# Patient Record
Sex: Male | Born: 1950
Health system: Southern US, Community
[De-identification: ages and names within clinical notes are randomized; demographics above are authoritative.]

## PROBLEM LIST (undated history)

## (undated) DIAGNOSIS — C801 Malignant (primary) neoplasm, unspecified: Secondary | ICD-10-CM

## (undated) DIAGNOSIS — F1910 Other psychoactive substance abuse, uncomplicated: Secondary | ICD-10-CM

## (undated) DIAGNOSIS — F1010 Alcohol abuse, uncomplicated: Secondary | ICD-10-CM

## (undated) DIAGNOSIS — I10 Essential (primary) hypertension: Secondary | ICD-10-CM

## (undated) DIAGNOSIS — E785 Hyperlipidemia, unspecified: Secondary | ICD-10-CM

## (undated) DIAGNOSIS — E119 Type 2 diabetes mellitus without complications: Secondary | ICD-10-CM

## (undated) HISTORY — DX: Other psychoactive substance abuse, uncomplicated: F19.10

## (undated) HISTORY — DX: Type 2 diabetes mellitus without complications: E11.9

## (undated) HISTORY — PX: COLONOSCOPY: SHX174

## (undated) HISTORY — PX: LAPAROSCOPIC GASTROTOMY W/ REPAIR OF ULCER: SUR772

## (undated) HISTORY — DX: Hyperlipidemia, unspecified: E78.5

## (undated) HISTORY — DX: Essential (primary) hypertension: I10

---

## 2008-12-28 ENCOUNTER — Emergency Department (HOSPITAL_COMMUNITY): Admission: EM | Admit: 2008-12-28 | Discharge: 2008-12-28 | Payer: Self-pay | Admitting: Family Medicine

## 2010-05-15 ENCOUNTER — Emergency Department (HOSPITAL_COMMUNITY): Admission: EM | Admit: 2010-05-15 | Discharge: 2010-05-15 | Payer: Self-pay | Admitting: Emergency Medicine

## 2011-04-06 ENCOUNTER — Inpatient Hospital Stay (INDEPENDENT_AMBULATORY_CARE_PROVIDER_SITE_OTHER)
Admission: RE | Admit: 2011-04-06 | Discharge: 2011-04-06 | Disposition: A | Discharge status: Home / Self Care | Payer: BC Managed Care – PPO | Source: Ambulatory Visit | Attending: Emergency Medicine | Admitting: Emergency Medicine

## 2011-04-06 DIAGNOSIS — K089 Disorder of teeth and supporting structures, unspecified: Secondary | ICD-10-CM

## 2011-04-06 DIAGNOSIS — S025XXA Fracture of tooth (traumatic), initial encounter for closed fracture: Secondary | ICD-10-CM

## 2013-12-10 ENCOUNTER — Encounter: Payer: Self-pay | Admitting: Internal Medicine

## 2013-12-10 ENCOUNTER — Ambulatory Visit: Payer: Self-pay | Attending: Internal Medicine | Admitting: Internal Medicine

## 2013-12-10 VITALS — BP 152/99 | HR 82 | Temp 99.3°F | Resp 16 | Ht 66.0 in | Wt 180.0 lb

## 2013-12-10 DIAGNOSIS — F1020 Alcohol dependence, uncomplicated: Secondary | ICD-10-CM

## 2013-12-10 DIAGNOSIS — M79609 Pain in unspecified limb: Secondary | ICD-10-CM | POA: Insufficient documentation

## 2013-12-10 DIAGNOSIS — M79601 Pain in right arm: Secondary | ICD-10-CM

## 2013-12-10 LAB — CBC WITH DIFFERENTIAL/PLATELET
Basophils Absolute: 0 10*3/uL (ref 0.0–0.1)
Eosinophils Relative: 4 % (ref 0–5)
HCT: 43.4 % (ref 39.0–52.0)
Lymphocytes Relative: 41 % (ref 12–46)
Lymphs Abs: 3 10*3/uL (ref 0.7–4.0)
MCH: 31.2 pg (ref 26.0–34.0)
MCV: 91.4 fL (ref 78.0–100.0)
Monocytes Absolute: 0.7 10*3/uL (ref 0.1–1.0)
Monocytes Relative: 9 % (ref 3–12)
Neutro Abs: 3.3 10*3/uL (ref 1.7–7.7)
RBC: 4.75 MIL/uL (ref 4.22–5.81)
RDW: 14.2 % (ref 11.5–15.5)
WBC: 7.2 10*3/uL (ref 4.0–10.5)

## 2013-12-10 LAB — LIPID PANEL
Cholesterol: 207 mg/dL — ABNORMAL HIGH (ref 0–200)
Total CHOL/HDL Ratio: 6.1 Ratio

## 2013-12-10 LAB — COMPLETE METABOLIC PANEL WITH GFR
BUN: 14 mg/dL (ref 6–23)
CO2: 28 mEq/L (ref 19–32)
Calcium: 9.2 mg/dL (ref 8.4–10.5)
Chloride: 103 mEq/L (ref 96–112)
Creat: 0.81 mg/dL (ref 0.50–1.35)
GFR, Est African American: >89 mL/min
GFR, Est Non African American: >89 mL/min
Glucose, Bld: 72 mg/dL (ref 70–99)
Potassium: 4.8 mEq/L (ref 3.5–5.3)

## 2013-12-10 LAB — URIC ACID: Uric Acid, Serum: 5.6 mg/dL (ref 4.0–7.8)

## 2013-12-10 MED ORDER — MELOXICAM 15 MG PO TABS: 15.0000 mg | ORAL_TABLET | Freq: Every day | ORAL | Status: DC

## 2013-12-10 MED ORDER — CYCLOBENZAPRINE HCL 5 MG PO TABS: 5.0000 mg | ORAL_TABLET | Freq: Three times a day (TID) | ORAL | Status: DC | PRN

## 2013-12-10 NOTE — Progress Notes (Signed)
Pt is here to establish care. Pt is requesting a physical. Pt reports having pain in his right arm for about 3 months.

## 2013-12-10 NOTE — Progress Notes (Signed)
Patient ID: Chad Cantu, male   DOB: 1951-04-23, 62 y.o.   MRN: [id number]   CC:  HPI:  62 year old male established care. He complains of right proximal arm muscle pain. No pain in the right shoulder joint. His range of motion is intact. Denies any history of trauma. He admits to drinking on a regular basis up to 2-3 beers a day. He states that if he could afford he would drink a case of beer every day. Denies any chest pain any shortness of breath  He states that he had a colonoscopy 2 years ago at [facility]  Smokes one pack a day Family history Mother had diabetes  No Known Allergies History reviewed. No pertinent past medical history. No current outpatient prescriptions on file prior to visit.   No current facility-administered medications on file prior to visit.   Family History  Problem Relation Age of Onset  . Diabetes Mother    History   Social History  . Marital Status: Married    Spouse Name: N/A    Number of Children: N/A  . Years of Education: N/A   Occupational History  . Not on file.   Social History Main Topics  . Smoking status: Heavy Tobacco Smoker -- 1.00 packs/day  . Smokeless tobacco: Not on file  . Alcohol Use: 1.5 oz/week    3 drink(s) per week  . Drug Use: No  . Sexual Activity: Yes   Other Topics Concern  . Not on file   Social History Narrative  . No narrative on file    Review of Systems  Constitutional: Negative for fever, chills, diaphoresis, activity change, appetite change and fatigue.  HENT: Negative for ear pain, nosebleeds, congestion, facial swelling, rhinorrhea, neck pain, neck stiffness and ear discharge.   Eyes: Negative for pain, discharge, redness, itching and visual disturbance.  Respiratory: Negative for cough, choking, chest tightness, shortness of breath, wheezing and stridor.   Cardiovascular: Negative for chest pain, palpitations and leg swelling.  Gastrointestinal: Negative for abdominal  distention.  Genitourinary: Negative for dysuria, urgency, frequency, hematuria, flank pain, decreased urine volume, difficulty urinating and dyspareunia.  Musculoskeletal: Negative for back pain, joint swelling, arthralgias and gait problem.  Neurological: Negative for dizziness, tremors, seizures, syncope, facial asymmetry, speech difficulty, weakness, light-headedness, numbness and headaches.  Hematological: Negative for adenopathy. Does not bruise/bleed easily.  Psychiatric/Behavioral: Negative for hallucinations, behavioral problems, confusion, dysphoric mood, decreased concentration and agitation.    Objective:   Filed Vitals:   12/10/13 1532  BP: 152/99  Pulse: 82  Temp: 99.3 F (37.4 C)  Resp: 16    Physical Exam  Constitutional: Appears well-developed and well-nourished. No distress.  HENT: Normocephalic. External right and left ear normal. Oropharynx is clear and moist.  Eyes: Conjunctivae and EOM are normal. PERRLA, no scleral icterus.  Neck: Normal ROM. Neck supple. No JVD. No tracheal deviation. No thyromegaly.  CVS: RRR, S1/S2 +, no murmurs, no gallops, no carotid bruit.  Pulmonary: Effort and breath sounds normal, no stridor, rhonchi, wheezes, rales.  Abdominal: Soft. BS +,  no distension, tenderness, rebound or guarding.  Musculoskeletal: Normal range of motion. Proximal arm pain right. No edema and no tenderness.  Lymphadenopathy: No lymphadenopathy noted, cervical, inguinal. Neuro: Alert. Normal reflexes, muscle tone coordination. No cranial nerve deficit. Skin: Skin is warm and dry. No rash noted. Not diaphoretic. No erythema. No pallor.  Psychiatric: Normal mood and affect. Behavior, judgment, thought content normal.   No results found for this basename: WBC, HGB,  HCT, MCV, PLT   No results found for this basename: CREATININE, BUN, NA, K, CL, CO2    No results found for this basename: HGBA1C   Lipid Panel  No results found for this basename: chol, trig,  hdl, cholhdl, vldl, ldlcalc       Assessment and plan:   There are no active problems to display for this patient.      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 The patient verbalized understanding. The patient was told to call to get any lab results if not heard anything in the next week.

## 2013-12-11 ENCOUNTER — Telehealth: Payer: Self-pay | Admitting: *Deleted

## 2013-12-11 LAB — VITAMIN D 25 HYDROXY (VIT D DEFICIENCY, FRACTURES): Vit D, 25-Hydroxy: 36 ng/mL (ref 30–89)

## 2013-12-11 LAB — TSH: TSH: 0.925 u[IU]/mL (ref 0.350–4.500)

## 2013-12-11 MED ORDER — GEMFIBROZIL 600 MG PO TABS: 600.0000 mg | ORAL_TABLET | Freq: Two times a day (BID) | ORAL | Status: DC

## 2013-12-11 NOTE — Telephone Encounter (Signed)
Message copied by Arneisha Kincannon, [place] R on Tue Dec 11, 2013 11:02 AM ------      Message from: Susie Cassette MD, [facility]      Created: Tue Dec 11, 2013 10:42 AM       Please notify patient of the patient's triglycerides of 581, call prescription for Lopid 600 mg twice a day, 30 day supply with 6 refills ------

## 2013-12-11 NOTE — Telephone Encounter (Signed)
Wrong number.

## 2013-12-12 ENCOUNTER — Other Ambulatory Visit: Payer: Self-pay | Admitting: Emergency Medicine

## 2013-12-17 ENCOUNTER — Ambulatory Visit (HOSPITAL_COMMUNITY)
Admission: RE | Admit: 2013-12-17 | Payer: BC Managed Care – PPO | Source: Ambulatory Visit | Attending: Family Medicine | Admitting: Family Medicine

## 2014-03-11 ENCOUNTER — Ambulatory Visit: Payer: BC Managed Care – PPO | Admitting: Internal Medicine

## 2014-04-29 ENCOUNTER — Encounter: Payer: Self-pay | Admitting: Internal Medicine

## 2014-04-29 ENCOUNTER — Ambulatory Visit: Payer: BC Managed Care – PPO | Attending: Internal Medicine | Admitting: Internal Medicine

## 2014-04-29 VITALS — BP 142/79 | HR 81 | Temp 98.6°F | Resp 18 | Ht 66.0 in | Wt 174.6 lb

## 2014-04-29 DIAGNOSIS — Z79899 Other long term (current) drug therapy: Secondary | ICD-10-CM | POA: Insufficient documentation

## 2014-04-29 DIAGNOSIS — F17200 Nicotine dependence, unspecified, uncomplicated: Secondary | ICD-10-CM | POA: Insufficient documentation

## 2014-04-29 DIAGNOSIS — K0889 Other specified disorders of teeth and supporting structures: Secondary | ICD-10-CM

## 2014-04-29 DIAGNOSIS — I10 Essential (primary) hypertension: Secondary | ICD-10-CM | POA: Insufficient documentation

## 2014-04-29 DIAGNOSIS — K089 Disorder of teeth and supporting structures, unspecified: Secondary | ICD-10-CM | POA: Insufficient documentation

## 2014-04-29 DIAGNOSIS — E785 Hyperlipidemia, unspecified: Secondary | ICD-10-CM | POA: Insufficient documentation

## 2014-04-29 DIAGNOSIS — F1010 Alcohol abuse, uncomplicated: Secondary | ICD-10-CM | POA: Insufficient documentation

## 2014-04-29 MED ORDER — GEMFIBROZIL 600 MG PO TABS: 600.0000 mg | ORAL_TABLET | Freq: Two times a day (BID) | ORAL | Status: DC

## 2014-04-29 MED ORDER — TRAMADOL HCL 50 MG PO TABS: 50.0000 mg | ORAL_TABLET | Freq: Four times a day (QID) | ORAL | Status: DC | PRN

## 2014-04-29 MED ORDER — LISINOPRIL-HYDROCHLOROTHIAZIDE 10-12.5 MG PO TABS: 1.0000 | ORAL_TABLET | Freq: Every day | ORAL | Status: DC

## 2014-04-29 MED ORDER — AMOXICILLIN 500 MG PO CAPS: 500.0000 mg | ORAL_CAPSULE | Freq: Three times a day (TID) | ORAL | Status: DC

## 2014-04-29 NOTE — Progress Notes (Signed)
Patient here for med refills States he was seen at urgent care 2 months ago for tooth pain Was placed on meds for HTN and hyperlipidemia at that time.

## 2014-04-29 NOTE — Progress Notes (Signed)
Patient ID: Chad Cantu, male   DOB: 1950-12-14, 63 y.o.   MRN: [id number]  CC: medication refills and followup for hypertension and hyperlipidemia  HPI: Patient presents to clinic today for a routine followup of his hypertension and hyperlipidemia. Patient reports that he has been compliant with medication regimen.  He also states that he he has been having tooth pain and generalized gum pain for over a month. She reports that he does not have the funds to visit the dentist at this time.  No Known Allergies Past Medical History  Diagnosis Date  . Hyperlipidemia   . Hypertension    Current Outpatient Prescriptions on File Prior to Visit  Medication Sig Dispense Refill  . cyclobenzaprine (FLEXERIL) 5 MG tablet Take 1 tablet (5 mg total) by mouth 3 (three) times daily as needed for muscle spasms.  30 tablet  1  . gemfibrozil (LOPID) 600 MG tablet Take 1 tablet (600 mg total) by mouth 2 (two) times daily before a meal.  30 tablet  6  . meloxicam (MOBIC) 15 MG tablet Take 1 tablet (15 mg total) by mouth daily.  30 tablet  0   No current facility-administered medications on file prior to visit.   Family History  Problem Relation Age of Onset  . Diabetes Mother    History   Social History  . Marital Status: Married    Spouse Name: N/A    Number of Children: N/A  . Years of Education: N/A   Occupational History  . Not on file.   Social History Main Topics  . Smoking status: Heavy Tobacco Smoker -- 1.00 packs/day  . Smokeless tobacco: Not on file  . Alcohol Use: 1.5 oz/week    3 drink(s) per week  . Drug Use: No  . Sexual Activity: Yes   Other Topics Concern  . Not on file   Social History Narrative  . No narrative on file   Review of Systems  Constitutional: Negative for fever and chills.  HENT: Negative for ear pain and nosebleeds.        Tooth pain on left side. Gums sore on left-upper and lower   Eyes: Negative for blurred vision.  Cardiovascular: Negative.    Musculoskeletal: Negative.   Neurological: Negative for headaches.      Objective:   Filed Vitals:   04/29/14 1153  BP: 142/79  Pulse: 81  Temp: 98.6 F (37 C)  Resp: 18   Physical Exam  Vitals reviewed. Constitutional: He is oriented to person, place, and time. He appears well-nourished.  Eyes: Pupils are equal, round, and reactive to light. No scleral icterus.  Neck: Normal range of motion. Neck supple. No JVD present.  Cardiovascular: Normal rate, regular rhythm and normal heart sounds.   Pulmonary/Chest: Effort normal and breath sounds normal.  Abdominal: Soft. Bowel sounds are normal.  Musculoskeletal: He exhibits no edema.  Lymphadenopathy:    He has no cervical adenopathy.  Neurological: He is alert and oriented to person, place, and time.  Skin: Skin is warm and dry.  Psychiatric: He has a normal mood and affect. Thought content normal.      Lab Results  Component Value Date   WBC 7.2 12/10/2013   HGB 14.8 12/10/2013   HCT 43.4 12/10/2013   MCV 91.4 12/10/2013   PLT 237 12/10/2013   Lab Results  Component Value Date   CREATININE 0.81 12/10/2013   BUN 14 12/10/2013   NA 138 12/10/2013   K 4.8 12/10/2013  CL 103 12/10/2013   CO2 28 12/10/2013    No results found for this basename: HGBA1C   Lipid Panel     Component Value Date/Time   CHOL 207* 12/10/2013 1602   TRIG 581* 12/10/2013 1602   HDL 34* 12/10/2013 1602   CHOLHDL 6.1 12/10/2013 1602   VLDL NOT CALC 12/10/2013 1602   LDLCALC Comment:   Not calculated due to Triglyceride >400. Suggest ordering Direct LDL (Unit Code: [phone]).   Total Cholesterol/HDL Ratio:CHD Risk                        Coronary Heart Disease Risk Table                                        Men       Women          1/2 Average Risk              3.4        3.3              Average Risk              5.0        4.4           2X Average Risk              9.6        7.1           3X Average Risk             23.4       11.0 Use  the calculated Patient Ratio above and the CHD Risk table  to determine the patient's CHD Risk. ATP III Classification (LDL):       < 100        mg/dL         Optimal      100 - 129     mg/dL         Near or Above Optimal      130 - 159     mg/dL         Borderline High      160 - 189     mg/dL         High       > 190        mg/dL         Very High   12/10/2013 1602       Assessment and plan:   Chad Cantu was seen today for follow-up, med refills, hypertension and hyperlipidemia.  Diagnoses and associated orders for this visit:  HTN (hypertension) Continue current regimen  lisinopril-hydrochlorothiazide (PRINZIDE,ZESTORETIC) 10-12.5 MG per tablet; Take 1 tablet by mouth daily.  Pain, dental - Ambulatory referral to Dentistry - amoxicillin (AMOXIL) 500 MG capsule; Take 1 capsule (500 mg total) by mouth 3 (three) times daily. - traMADol (ULTRAM) 50 MG tablet; Take 1 tablet (50 mg total) by mouth every 6 (six) hours as needed.  HLD (hyperlipidemia) - gemfibrozil (LOPID) 600 MG tablet; Take 1 tablet (600 mg total) by mouth 2 (two) times daily before a meal. Explained to patient that cutting back on alcohol may improve triglycerides. - Lipid panel; Future  Alcohol abuse  counseled patient extensively on the effects of alcohol abuse and cardiovascular health  Return in  about 4 weeks (around 05/27/2014) for Lab Visit, 3 months with PCP.        Chari Manning, NP-C [facility] [phone] 04/30/2014, 9:18 PM

## 2014-05-27 ENCOUNTER — Ambulatory Visit: Payer: BC Managed Care – PPO | Attending: Internal Medicine

## 2014-05-27 DIAGNOSIS — E785 Hyperlipidemia, unspecified: Secondary | ICD-10-CM

## 2014-05-27 LAB — LIPID PANEL
CHOL/HDL RATIO: 4.5 ratio
Cholesterol: 205 mg/dL — ABNORMAL HIGH (ref 0–200)
HDL: 46 mg/dL (ref >39)
LDL CALC: 97 mg/dL (ref 0–99)
Triglycerides: 310 mg/dL — ABNORMAL HIGH (ref <150)
VLDL: 62 mg/dL — ABNORMAL HIGH (ref 0–40)

## 2014-05-29 ENCOUNTER — Telehealth: Payer: Self-pay

## 2014-05-29 NOTE — Telephone Encounter (Signed)
Patient is aware of his lab results 

## 2014-05-29 NOTE — Telephone Encounter (Signed)
Message copied by Dorothe Pea on Wed May 29, 2014 12:44 PM ------      Message from: Chari Manning A      Created: Tue May 28, 2014 10:30 PM       Let patient know his triglycerides have improved but he needs to continue gemfibrozil.  Remind patient about diet changes and alcohol avoidance.  Avoid excessive fried foods, butters, and starches. ------

## 2014-07-31 ENCOUNTER — Ambulatory Visit: Payer: BC Managed Care – PPO | Admitting: Internal Medicine

## 2014-09-20 ENCOUNTER — Ambulatory Visit
Admission: RE | Admit: 2014-09-20 | Discharge: 2014-09-20 | Disposition: A | Discharge status: Home / Self Care | Payer: No Typology Code available for payment source | Source: Ambulatory Visit | Attending: Occupational Medicine | Admitting: Occupational Medicine

## 2014-09-20 ENCOUNTER — Other Ambulatory Visit: Payer: Self-pay | Admitting: Occupational Medicine

## 2014-09-20 DIAGNOSIS — A159 Respiratory tuberculosis unspecified: Secondary | ICD-10-CM

## 2014-09-23 ENCOUNTER — Other Ambulatory Visit: Payer: Self-pay | Admitting: Internal Medicine

## 2014-09-26 ENCOUNTER — Other Ambulatory Visit: Payer: Self-pay | Admitting: Internal Medicine

## 2014-09-26 DIAGNOSIS — I10 Essential (primary) hypertension: Secondary | ICD-10-CM

## 2014-09-26 MED ORDER — LISINOPRIL-HYDROCHLOROTHIAZIDE 10-12.5 MG PO TABS: 1.0000 | ORAL_TABLET | Freq: Every day | ORAL | Status: DC

## 2014-12-17 ENCOUNTER — Encounter: Payer: BC Managed Care – PPO | Admitting: Internal Medicine

## 2015-02-18 ENCOUNTER — Encounter: Payer: Self-pay | Admitting: Internal Medicine

## 2015-04-08 ENCOUNTER — Encounter: Payer: Self-pay | Admitting: Internal Medicine

## 2015-04-08 ENCOUNTER — Ambulatory Visit: Payer: 59 | Attending: Internal Medicine | Admitting: Internal Medicine

## 2015-04-08 VITALS — BP 149/104 | HR 94 | Temp 98.1°F | Resp 16 | Ht 66.0 in | Wt 177.0 lb

## 2015-04-08 DIAGNOSIS — I10 Essential (primary) hypertension: Secondary | ICD-10-CM | POA: Diagnosis not present

## 2015-04-08 DIAGNOSIS — L309 Dermatitis, unspecified: Secondary | ICD-10-CM | POA: Diagnosis not present

## 2015-04-08 DIAGNOSIS — Z720 Tobacco use: Secondary | ICD-10-CM | POA: Insufficient documentation

## 2015-04-08 DIAGNOSIS — F17200 Nicotine dependence, unspecified, uncomplicated: Secondary | ICD-10-CM | POA: Insufficient documentation

## 2015-04-08 LAB — CBC WITH DIFFERENTIAL/PLATELET
BASOS PCT: 1 % (ref 0–1)
Basophils Absolute: 0.1 10*3/uL (ref 0.0–0.1)
Eosinophils Absolute: 0.2 10*3/uL (ref 0.0–0.7)
Eosinophils Relative: 2 % (ref 0–5)
HEMATOCRIT: 44.4 % (ref 39.0–52.0)
Hemoglobin: 15.1 g/dL (ref 13.0–17.0)
LYMPHS PCT: 33 % (ref 12–46)
Lymphs Abs: 2.6 10*3/uL (ref 0.7–4.0)
MCH: 30.9 pg (ref 26.0–34.0)
MCHC: 34 g/dL (ref 30.0–36.0)
MCV: 91 fL (ref 78.0–100.0)
MPV: 9.4 fL (ref 8.6–12.4)
Monocytes Absolute: 0.6 10*3/uL (ref 0.1–1.0)
Monocytes Relative: 8 % (ref 3–12)
NEUTROS ABS: 4.4 10*3/uL (ref 1.7–7.7)
NEUTROS PCT: 56 % (ref 43–77)
Platelets: 259 10*3/uL (ref 150–400)
RBC: 4.88 MIL/uL (ref 4.22–5.81)
RDW: 15.5 % (ref 11.5–15.5)
WBC: 7.9 10*3/uL (ref 4.0–10.5)

## 2015-04-08 LAB — COMPLETE METABOLIC PANEL WITH GFR
ALBUMIN: 4.2 g/dL (ref 3.5–5.2)
ALT: 35 U/L (ref 0–53)
AST: 38 U/L — ABNORMAL HIGH (ref 0–37)
Alkaline Phosphatase: 60 U/L (ref 39–117)
BUN: 12 mg/dL (ref 6–23)
CO2: 22 meq/L (ref 19–32)
Calcium: 9.5 mg/dL (ref 8.4–10.5)
Chloride: 103 mEq/L (ref 96–112)
Creat: 0.81 mg/dL (ref 0.50–1.35)
GFR, Est African American: >89 mL/min
Glucose, Bld: 85 mg/dL (ref 70–99)
POTASSIUM: 4.7 meq/L (ref 3.5–5.3)
Sodium: 139 mEq/L (ref 135–145)
TOTAL PROTEIN: 7.8 g/dL (ref 6.0–8.3)
Total Bilirubin: 0.4 mg/dL (ref 0.2–1.2)

## 2015-04-08 LAB — LIPID PANEL
Cholesterol: 215 mg/dL — ABNORMAL HIGH (ref 0–200)
HDL: 46 mg/dL (ref >=40)
LDL Cholesterol: 103 mg/dL — ABNORMAL HIGH (ref 0–99)
TRIGLYCERIDES: 331 mg/dL — AB (ref <150)
Total CHOL/HDL Ratio: 4.7 Ratio
VLDL: 66 mg/dL — ABNORMAL HIGH (ref 0–40)

## 2015-04-08 MED ORDER — LISINOPRIL-HYDROCHLOROTHIAZIDE 10-12.5 MG PO TABS
1.0000 | ORAL_TABLET | Freq: Every day | ORAL | Status: DC
Start: 2015-04-08 — End: 2015-12-02

## 2015-04-08 MED ORDER — TRIAMCINOLONE ACETONIDE 0.1 % EX CREA: 1.0000 "application " | TOPICAL_CREAM | Freq: Two times a day (BID) | CUTANEOUS | Status: DC

## 2015-04-08 NOTE — Patient Instructions (Signed)
[email or web address] or [email or web address]     Chronic Obstructive Pulmonary Disease Chronic obstructive pulmonary disease (COPD) is a common lung condition in which airflow from the lungs is limited. COPD is a general term that can be used to describe many different lung problems that limit airflow, including both chronic bronchitis and emphysema. If you have COPD, your lung function will probably never return to normal, but there are measures you can take to improve lung function and make yourself feel better.  CAUSES   Smoking (common).   Exposure to secondhand smoke.   Genetic problems.  Chronic inflammatory lung diseases or recurrent infections. SYMPTOMS   Shortness of breath, especially with physical activity.   Deep, persistent (chronic) cough with a large amount of thick mucus.   Wheezing.   Rapid breaths (tachypnea).   Gray or bluish discoloration (cyanosis) of the skin, especially in fingers, toes, or lips.   Fatigue.   Weight loss.   Frequent infections or episodes when breathing symptoms become much worse (exacerbations).   Chest tightness. DIAGNOSIS  Your health care provider will take a medical history and perform a physical examination to make the initial diagnosis. Additional tests for COPD may include:   Lung (pulmonary) function tests.  Chest X-ray.  CT scan.  Blood tests. TREATMENT  Treatment available to help you feel better when you have COPD includes:   Inhaler and nebulizer medicines. These help manage the symptoms of COPD and make your breathing more comfortable.  Supplemental oxygen. Supplemental oxygen is only helpful if you have a low oxygen level in your blood.   Exercise and physical activity. These are beneficial for nearly all people with COPD. Some people may also benefit from a pulmonary rehabilitation program. HOME CARE INSTRUCTIONS   Take all medicines (inhaled or pills) as directed by your health care provider.  Avoid  over-the-counter medicines or cough syrups that dry up your airway (such as antihistamines) and slow down the elimination of secretions unless instructed otherwise by your health care provider.   If you are a smoker, the most important thing that you can do is stop smoking. Continuing to smoke will cause further lung damage and breathing trouble. Ask your health care provider for help with quitting smoking. He or she can direct you to community resources or hospitals that provide support.  Avoid exposure to irritants such as smoke, chemicals, and fumes that aggravate your breathing.  Use oxygen therapy and pulmonary rehabilitation if directed by your health care provider. If you require home oxygen therapy, ask your health care provider whether you should purchase a pulse oximeter to measure your oxygen level at home.   Avoid contact with individuals who have a contagious illness.  Avoid extreme temperature and humidity changes.  Eat healthy foods. Eating smaller, more frequent meals and resting before meals may help you maintain your strength.  Stay active, but balance activity with periods of rest. Exercise and physical activity will help you maintain your ability to do things you want to do.  Preventing infection and hospitalization is very important when you have COPD. Make sure to receive all the vaccines your health care provider recommends, especially the pneumococcal and influenza vaccines. Ask your health care provider whether you need a pneumonia vaccine.  Learn and use relaxation techniques to manage stress.  Learn and use controlled breathing techniques as directed by your health care provider. Controlled breathing techniques include:   Pursed lip breathing. Start by breathing in (inhaling) through your nose for 1 second. Then,  purse your lips as if you were going to whistle and breathe out (exhale) through the pursed lips for 2 seconds.   Diaphragmatic breathing. Start by  putting one hand on your abdomen just above your waist. Inhale slowly through your nose. The hand on your abdomen should move out. Then purse your lips and exhale slowly. You should be able to feel the hand on your abdomen moving in as you exhale.   Learn and use controlled coughing to clear mucus from your lungs. Controlled coughing is a series of short, progressive coughs. The steps of controlled coughing are:  1. Lean your head slightly forward.  2. Breathe in deeply using diaphragmatic breathing.  3. Try to hold your breath for 3 seconds.  4. Keep your mouth slightly open while coughing twice.  5. Spit any mucus out into a tissue.  6. Rest and repeat the steps once or twice as needed. SEEK MEDICAL CARE IF:   You are coughing up more mucus than usual.   There is a change in the color or thickness of your mucus.   Your breathing is more labored than usual.   Your breathing is faster than usual.  SEEK IMMEDIATE MEDICAL CARE IF:   You have shortness of breath while you are resting.   You have shortness of breath that prevents you from:  Being able to talk.   Performing your usual physical activities.   You have chest pain lasting longer than 5 minutes.   Your skin color is more cyanotic than usual.  You measure low oxygen saturations for longer than 5 minutes with a pulse oximeter. MAKE SURE YOU:   Understand these instructions.  Will watch your condition.  Will get help right away if you are not doing well or get worse. Document Released: 09/08/2005 Document Revised: 04/15/2014 Document Reviewed: 07/26/2013 [facility] Patient Information 2015 [place], [place]. This information is not intended to replace advice given to you by your health care provider. Make sure you discuss any questions you have with your health care provider.

## 2015-04-08 NOTE — Progress Notes (Signed)
Pt is here following up on his HTN. Pt states that he has not taking any bp medication for over a month. Pt reports having a rash that only itches on his right leg. Pt is requesting a physical.

## 2015-04-08 NOTE — Progress Notes (Signed)
Patient ID: Chad Cantu, male   DOB: [birth year], 64 y.o.   MRN: [id number]  CC: rash, HTN  HPI: Chad Cantu is a 64 y.o. male here today for a follow up visit.  Patient has past medical history of HTN and HLD. He reports that he has been out of his blood pressure medication for over one month. He states that he has been checking his BP at home which is usually around 177'L systolic. He reports that he had a colonoscopy 2 years ago and his last eye exam was 2 months ago.  He reports that he has had a rash on his right lateral leg. The rash is itchy but has not spread to other locations. 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 He does acknowledge coughing and SOB with smoking.   Patient has No headache, No chest pain, No abdominal pain - No Nausea, No new weakness tingling or numbness, No Cough - SOB.  No Known Allergies Past Medical History  Diagnosis Date  . Hyperlipidemia   . Hypertension    Current Outpatient Prescriptions on File Prior to Visit  Medication Sig Dispense Refill  . lisinopril-hydrochlorothiazide (PRINZIDE,ZESTORETIC) 10-12.5 MG per tablet Take 1 tablet by mouth daily. 30 tablet 3  . amoxicillin (AMOXIL) 500 MG capsule Take 1 capsule (500 mg total) by mouth 3 (three) times daily. (Patient not taking: Reported on 04/08/2015) 30 capsule 0  . cyclobenzaprine (FLEXERIL) 5 MG tablet Take 1 tablet (5 mg total) by mouth 3 (three) times daily as needed for muscle spasms. (Patient not taking: Reported on 04/08/2015) 30 tablet 1  . gemfibrozil (LOPID) 600 MG tablet Take 1 tablet (600 mg total) by mouth 2 (two) times daily before a meal. (Patient not taking: Reported on 04/08/2015) 30 tablet 6  . meloxicam (MOBIC) 15 MG tablet Take 1 tablet (15 mg total) by mouth daily. (Patient not taking: Reported on 04/08/2015) 30 tablet 0  . traMADol (ULTRAM) 50 MG tablet Take 1 tablet (50 mg total) by mouth every 6 (six) hours as needed. (Patient not taking: Reported on  04/08/2015) 30 tablet 0   No current facility-administered medications on file prior to visit.   Family History  Problem Relation Age of Onset  . Diabetes Mother    History   Social History  . Marital Status: Married    Spouse Name: N/A  . Number of Children: N/A  . Years of Education: N/A   Occupational History  . Not on file.   Social History Main Topics  . Smoking status: Heavy Tobacco Smoker -- 1.00 packs/day  . Smokeless tobacco: Not on file  . Alcohol Use: 1.5 oz/week    3 drink(s) per week  . Drug Use: No  . Sexual Activity: Yes   Other Topics Concern  . Not on file   Social History Narrative    Review of Systems  Constitutional: Negative for fever, chills and weight loss.  Respiratory: Positive for cough and shortness of breath. Negative for sputum production and wheezing.   Cardiovascular: Negative.   Skin: Positive for itching and rash.  Neurological: Negative for headaches.  All other systems reviewed and are negative.     Objective:   Filed Vitals:   04/08/15 1034  BP: 149/104  Pulse: 94  Temp: 98.1 F (36.7 C)  Resp: 16    Physical Exam  Constitutional: He is oriented to person, place, and time.  HENT:  Right Ear: External ear normal.  Left Ear: External ear normal.  Mouth/Throat: Oropharynx is clear and moist.  Eyes: Conjunctivae and EOM are normal. Pupils are equal, round, and reactive to light.  Neck: Normal range of motion.  Cardiovascular: Normal rate, regular rhythm and normal heart sounds.   Pulmonary/Chest: Effort normal and breath sounds normal.  Abdominal: Soft. Bowel sounds are normal.  Musculoskeletal: He exhibits no tenderness.  Lymphadenopathy:    He has no cervical adenopathy.  Neurological: He is alert and oriented to person, place, and time.  Skin: Skin is warm and dry. Rash noted.     Lab Results  Component Value Date   WBC 7.2 12/10/2013   HGB 14.8 12/10/2013   HCT 43.4 12/10/2013   MCV 91.4 12/10/2013    PLT 237 12/10/2013   Lab Results  Component Value Date   CREATININE 0.81 12/10/2013   BUN 14 12/10/2013   NA 138 12/10/2013   K 4.8 12/10/2013   CL 103 12/10/2013   CO2 28 12/10/2013    No results found for: HGBA1C Lipid Panel     Component Value Date/Time   CHOL 205* 05/27/2014 0857   TRIG 310* 05/27/2014 0857   HDL 46 05/27/2014 0857   CHOLHDL 4.5 05/27/2014 0857   VLDL 62* 05/27/2014 0857   LDLCALC 97 05/27/2014 0857       Assessment and plan:   Chad Cantu was seen today for follow-up.  Diagnoses and all orders for this visit:  Essential hypertension Orders: -    Refill lisinopril-hydrochlorothiazide (PRINZIDE,ZESTORETIC) 10-12.5 MG per tablet; Take 1 tablet by mouth daily. -     Lipid panel -     PSA -     CBC with Differential -     COMPLETE METABOLIC PANEL WITH GFR -     Vitamin D, 25-hydroxy Patient blood pressure remains elevated today, will increase BP medication and have patient to return in 2 weeks for blood pressure recheck with nurse. Stressed diet changes, regular exercise regimen, and modifiable risk factors. Will follow up with CMP as needed, Will follow up with patient in 3-6 months.   Dermatitis Orders: -     triamcinolone cream (KENALOG) 0.1 %; Apply 1 application topically 2 (two) times daily. Explained to patient that he would benefit from wearing long pants while working   Smoking Smoking cessation discussed for 3 minutes, patient is not willing to quit at this time. Will continue to assess on each visit. Discussed increased risk for diseases such as cancer, heart disease, and stroke.    Return in about 6 months (around 10/08/2015) for Hypertension and 2 weeks BP check.       Chari Manning, [facility] [phone] 04/08/2015, 11:33 AM

## 2015-04-09 ENCOUNTER — Other Ambulatory Visit: Payer: Self-pay | Admitting: Internal Medicine

## 2015-04-09 DIAGNOSIS — E785 Hyperlipidemia, unspecified: Secondary | ICD-10-CM

## 2015-04-09 LAB — PSA: PSA: 1.02 ng/mL (ref <=4.00)

## 2015-04-09 LAB — VITAMIN D 25 HYDROXY (VIT D DEFICIENCY, FRACTURES): Vit D, 25-Hydroxy: 29 ng/mL — ABNORMAL LOW (ref 30–100)

## 2015-04-09 MED ORDER — ATORVASTATIN CALCIUM 10 MG PO TABS: 10.0000 mg | ORAL_TABLET | Freq: Every day | ORAL | Status: DC

## 2015-04-15 ENCOUNTER — Telehealth: Payer: Self-pay | Admitting: *Deleted

## 2015-04-15 ENCOUNTER — Telehealth: Payer: Self-pay | Admitting: Internal Medicine

## 2015-04-15 NOTE — Telephone Encounter (Signed)
-----   Message from Lance Bosch, NP sent at 04/09/2015  5:56 PM EDT ----- Patients cholesterol is too high. I have sent a rx for atorvastatin 10 mg to take every evening. Please go over diet and exercise.  He needs to get a OTC daily multivitamin to take such as a One a Day for Men. His vitamin D was slightly low

## 2015-04-15 NOTE — Telephone Encounter (Signed)
Left message with wife for pt to return my call.

## 2015-04-15 NOTE — Telephone Encounter (Signed)
Patient called to request to speak to CMA, please f/u with pt.

## 2015-12-02 ENCOUNTER — Other Ambulatory Visit: Payer: Self-pay | Admitting: Internal Medicine

## 2015-12-30 MED FILL — LISINOPRIL-HCTZ 10-12.5 MG: 10-12.5 | 30 days supply | Qty: 30 | Fill #0

## 2016-01-08 ENCOUNTER — Ambulatory Visit (HOSPITAL_COMMUNITY)
Admission: RE | Admit: 2016-01-08 | Discharge: 2016-01-08 | Disposition: A | Discharge status: Home / Self Care | Payer: BLUE CROSS/BLUE SHIELD | Source: Ambulatory Visit | Attending: Internal Medicine | Admitting: Internal Medicine

## 2016-01-08 ENCOUNTER — Encounter: Payer: Self-pay | Admitting: Internal Medicine

## 2016-01-08 ENCOUNTER — Ambulatory Visit (HOSPITAL_BASED_OUTPATIENT_CLINIC_OR_DEPARTMENT_OTHER): Payer: BLUE CROSS/BLUE SHIELD | Admitting: Internal Medicine

## 2016-01-08 VITALS — BP 132/87 | HR 88 | Temp 98.0°F | Resp 16 | Ht 66.0 in | Wt 181.6 lb

## 2016-01-08 DIAGNOSIS — M25511 Pain in right shoulder: Secondary | ICD-10-CM | POA: Diagnosis not present

## 2016-01-08 DIAGNOSIS — S4991XA Unspecified injury of right shoulder and upper arm, initial encounter: Secondary | ICD-10-CM | POA: Diagnosis not present

## 2016-01-08 DIAGNOSIS — Z2821 Immunization not carried out because of patient refusal: Secondary | ICD-10-CM | POA: Diagnosis not present

## 2016-01-08 DIAGNOSIS — W19XXXA Unspecified fall, initial encounter: Secondary | ICD-10-CM | POA: Diagnosis not present

## 2016-01-08 DIAGNOSIS — Z0000 Encounter for general adult medical examination without abnormal findings: Secondary | ICD-10-CM

## 2016-01-08 LAB — POCT GLYCOSYLATED HEMOGLOBIN (HGB A1C): Hemoglobin A1C: 6.1

## 2016-01-08 MED ORDER — TRAMADOL HCL 50 MG PO TABS: 50.0000 mg | ORAL_TABLET | Freq: Two times a day (BID) | ORAL | Status: DC | PRN

## 2016-01-08 MED ORDER — MELOXICAM 15 MG PO TABS: 15.0000 mg | ORAL_TABLET | Freq: Every day | ORAL | Status: DC

## 2016-01-08 MED FILL — MELOXICAM 15 MG TABLET: 15 | 30 days supply | Qty: 30 | Fill #0

## 2016-01-08 MED FILL — traMADol HCL 50 MG TABS: 50 | 15 days supply | Qty: 30 | Fill #0

## 2016-01-08 NOTE — Progress Notes (Addendum)
   Subjective:    Patient ID: Chad Cantu, male    DOB: 1951/06/13, 65 y.o.   MRN: [id number]  HPI Comments: Patent reports that he ran into a Wall last month (12/22) and hit his arm, now having pain. He states that he is unable to lift his arm up past a 90 degree angle. When he sleeps he has a tingling sensation that radiates to his fingers. When he attempts to raise his arm and then lower it he has a pain in the bend of his arm.   Shoulder Pain  The pain is present in the right shoulder and right arm. This is a new problem. Quality: pulling sensation. The pain is moderate. Associated symptoms include tingling. The symptoms are aggravated by contact and activity. There is no history of diabetes.      Review of Systems  Neurological: Positive for tingling.  All other systems reviewed and are negative.      Objective:   Physical Exam  Cardiovascular: Normal rate, regular rhythm and normal heart sounds.   Pulmonary/Chest: Effort normal and breath sounds normal.  Musculoskeletal: He exhibits tenderness.  Unable to lift above 90 angle. Pain with full extension of right arm      Assessment & Plan:  Chad Cantu was seen today for shoulder pain.  Diagnoses and all orders for this visit:  Right shoulder injury, initial encounter -     Ambulatory referral to Orthopedics -     traMADol (ULTRAM) 50 MG tablet; Take 1 tablet (50 mg total) by mouth every 12 (twelve) hours as needed for severe pain. -     meloxicam (MOBIC) 15 MG tablet; Take 1 tablet (15 mg total) by mouth daily. -     DG Shoulder Right; Future  Refused influenza vaccine  Explained that annual influenza is recommended per CDC guidelines and is highly suggested to anyone who has has CHF, COPD, DM or immunocompromised. Benefits of influenza described in detail.  Health care maintenance -     HgB A1c  Return in about 1 week (around 01/15/2016) for physical.  Lance Bosch, NP 01/08/2016 2:33 PM

## 2016-01-08 NOTE — Progress Notes (Signed)
Patient complains of having trouble lifting his right arm Patient stated he ran into a wa ll back in December and must have  Hit his shoulder and head on the wall Patient also presents in office with elevated blood pressure and stated He took his medication today Flu vaccine declined

## 2016-01-09 ENCOUNTER — Telehealth: Payer: Self-pay

## 2016-01-09 NOTE — Telephone Encounter (Signed)
-----   Message from Lance Bosch, NP sent at 01/08/2016  6:23 PM EST ----- Normal xray

## 2016-01-09 NOTE — Telephone Encounter (Signed)
Tried to contact patient Patient not available Message left on voice mail to return our call 

## 2016-01-16 ENCOUNTER — Encounter: Payer: Self-pay | Admitting: Internal Medicine

## 2016-01-16 ENCOUNTER — Ambulatory Visit: Payer: BLUE CROSS/BLUE SHIELD | Attending: Internal Medicine | Admitting: Internal Medicine

## 2016-01-16 VITALS — BP 138/92 | HR 84 | Temp 98.3°F | Resp 16 | Ht 66.0 in | Wt 179.0 lb

## 2016-01-16 DIAGNOSIS — R030 Elevated blood-pressure reading, without diagnosis of hypertension: Secondary | ICD-10-CM

## 2016-01-16 DIAGNOSIS — I10 Essential (primary) hypertension: Secondary | ICD-10-CM

## 2016-01-16 DIAGNOSIS — F17210 Nicotine dependence, cigarettes, uncomplicated: Secondary | ICD-10-CM | POA: Insufficient documentation

## 2016-01-16 DIAGNOSIS — E785 Hyperlipidemia, unspecified: Secondary | ICD-10-CM | POA: Insufficient documentation

## 2016-01-16 DIAGNOSIS — Z0000 Encounter for general adult medical examination without abnormal findings: Secondary | ICD-10-CM

## 2016-01-16 DIAGNOSIS — Z79899 Other long term (current) drug therapy: Secondary | ICD-10-CM | POA: Diagnosis not present

## 2016-01-16 DIAGNOSIS — IMO0001 Reserved for inherently not codable concepts without codable children: Secondary | ICD-10-CM

## 2016-01-16 LAB — CBC WITH DIFFERENTIAL/PLATELET
Basophils Absolute: 0.1 10*3/uL (ref 0.0–0.1)
Basophils Relative: 1 % (ref 0–1)
EOS PCT: 3 % (ref 0–5)
Eosinophils Absolute: 0.3 10*3/uL (ref 0.0–0.7)
HCT: 45 % (ref 39.0–52.0)
Hemoglobin: 15.7 g/dL (ref 13.0–17.0)
LYMPHS ABS: 3.3 10*3/uL (ref 0.7–4.0)
Lymphocytes Relative: 36 % (ref 12–46)
MCH: 31 pg (ref 26.0–34.0)
MCHC: 34.9 g/dL (ref 30.0–36.0)
MCV: 88.9 fL (ref 78.0–100.0)
MONOS PCT: 8 % (ref 3–12)
MPV: 9.5 fL (ref 8.6–12.4)
Monocytes Absolute: 0.7 10*3/uL (ref 0.1–1.0)
Neutro Abs: 4.7 10*3/uL (ref 1.7–7.7)
Neutrophils Relative %: 52 % (ref 43–77)
Platelets: 280 10*3/uL (ref 150–400)
RBC: 5.06 MIL/uL (ref 4.22–5.81)
RDW: 13.7 % (ref 11.5–15.5)
WBC: 9.1 10*3/uL (ref 4.0–10.5)

## 2016-01-16 LAB — BASIC METABOLIC PANEL
BUN: 13 mg/dL (ref 7–25)
CO2: 27 mmol/L (ref 20–31)
Calcium: 9.5 mg/dL (ref 8.6–10.3)
Chloride: 96 mmol/L — ABNORMAL LOW (ref 98–110)
Creat: 0.8 mg/dL (ref 0.70–1.25)
GLUCOSE: 133 mg/dL — AB (ref 65–99)
Potassium: 3.9 mmol/L (ref 3.5–5.3)
SODIUM: 133 mmol/L — AB (ref 135–146)

## 2016-01-16 LAB — LIPID PANEL
CHOL/HDL RATIO: 6.5 ratio — AB (ref <=5.0)
Cholesterol: 234 mg/dL — ABNORMAL HIGH (ref 125–200)
HDL: 36 mg/dL — ABNORMAL LOW (ref >=40)
TRIGLYCERIDES: 476 mg/dL — AB (ref <150)

## 2016-01-16 LAB — HEMOCCULT GUIAC POC 1CARD (OFFICE): Fecal Occult Blood, POC: NEGATIVE

## 2016-01-16 MED ORDER — CLONIDINE HCL 0.1 MG PO TABS
0.1000 mg | ORAL_TABLET | Freq: Once | ORAL | Status: AC
  Administered 2016-01-16: 0.1 mg via ORAL

## 2016-01-16 MED ORDER — LISINOPRIL-HYDROCHLOROTHIAZIDE 10-12.5 MG PO TABS: 1.0000 | ORAL_TABLET | Freq: Every day | ORAL | Status: DC

## 2016-01-16 MED ORDER — ATORVASTATIN CALCIUM 10 MG PO TABS: 10.0000 mg | ORAL_TABLET | Freq: Every day | ORAL | Status: DC

## 2016-01-16 MED FILL — ATORVASTATIN 10 MG TABLET: 10 | 30 days supply | Qty: 30 | Fill #0

## 2016-01-16 NOTE — Progress Notes (Signed)
Patient ID: Chad Cantu, male   DOB: [birth year], 65 y.o.   MRN: [id number]  CC: physical  HPI: Chad Cantu is a 65 y.o. male here today for a follow up visit.  Patient has past medical history of hypertension and hyperlipidemia. Patient only takes Prinzide for blood pressure. States that he has not had his cholesterol medication in several months. So he believes his cholesterol may be elevated. He has not changes in medical history and is not on any new medications. Patient currently drinks several beers on weekends only and smokes less than a 1/2 ppd. He is not ready to quit smoking.   No Known Allergies Past Medical History  Diagnosis Date  . Hyperlipidemia   . Hypertension    Current Outpatient Prescriptions on File Prior to Visit  Medication Sig Dispense Refill  . lisinopril-hydrochlorothiazide (PRINZIDE,ZESTORETIC) 10-12.5 MG tablet Take 1 tablet by mouth daily. Must have office visit for more refills 30 tablet 0  . meloxicam (MOBIC) 15 MG tablet Take 1 tablet (15 mg total) by mouth daily. 30 tablet 0  . traMADol (ULTRAM) 50 MG tablet Take 1 tablet (50 mg total) by mouth every 12 (twelve) hours as needed for severe pain. 30 tablet 0  . amoxicillin (AMOXIL) 500 MG capsule Take 1 capsule (500 mg total) by mouth 3 (three) times daily. (Patient not taking: Reported on 04/08/2015) 30 capsule 0  . atorvastatin (LIPITOR) 10 MG tablet Take 1 tablet (10 mg total) by mouth daily. 30 tablet 5  . cyclobenzaprine (FLEXERIL) 5 MG tablet Take 1 tablet (5 mg total) by mouth 3 (three) times daily as needed for muscle spasms. (Patient not taking: Reported on 04/08/2015) 30 tablet 1  . triamcinolone cream (KENALOG) 0.1 % Apply 1 application topically 2 (two) times daily. 30 g 0   No current facility-administered medications on file prior to visit.   Family History  Problem Relation Age of Onset  . Diabetes Mother    Social History   Social History  . Marital Status: Married    Spouse Name: N/A   . Number of Children: N/A  . Years of Education: N/A   Occupational History  . Not on file.   Social History Main Topics  . Smoking status: Heavy Tobacco Smoker -- 1.00 packs/day  . Smokeless tobacco: Not on file  . Alcohol Use: 1.5 oz/week    3 drink(s) per week  . Drug Use: No  . Sexual Activity: Yes   Other Topics Concern  . Not on file   Social History Narrative    Review of Systems  Musculoskeletal: Positive for joint pain.  Psychiatric/Behavioral:       Tobacco use and alcohol use  All other systems reviewed and are negative.     Objective:   Filed Vitals:   01/16/16 1021 01/16/16 1025  BP: 175/104 168/102  Pulse: 81 78  Temp: 98.3 F (36.8 C)   Resp: 16     Physical Exam: Constitutional: Patient appears well-developed and well-nourished. No distress. HENT: Normocephalic, atraumatic, External right and left ear normal. Oropharynx is clear and moist.  Eyes: Conjunctivae and EOM are normal. PERRLA, no scleral icterus. Neck: Normal ROM. Neck supple. No JVD. No tracheal deviation. No thyromegaly. CVS: RRR, S1/S2 +, no murmurs, no gallops, no carotid bruit.  Pulmonary: Effort and breath sounds normal, no stridor, rhonchi, wheezes, rales.  Abdominal: Soft. BS +,  no distension, tenderness, rebound or guarding.  Musculoskeletal: Normal range of motion. No edema and no tenderness.  Lymphadenopathy: No lymphadenopathy noted, cervical, inguinal or axillary Neuro: Alert. Normal reflexes, muscle tone coordination. No cranial nerve deficit. Skin: Skin is warm and dry. No rash noted. Not diaphoretic. No erythema. No pallor. Psychiatric: Normal mood and affect. Behavior, judgment, thought content normal. Rectal: normal tone, normal prostate, no masses or tenderness and the prostate is of normal size and consistency, nontender, & without nodules    Lab Results  Component Value Date   WBC 7.9 04/08/2015   HGB 15.1 04/08/2015   HCT 44.4 04/08/2015   MCV 91.0  04/08/2015   PLT 259 04/08/2015   Lab Results  Component Value Date   CREATININE 0.81 04/08/2015   BUN 12 04/08/2015   NA 139 04/08/2015   K 4.7 04/08/2015   CL 103 04/08/2015   CO2 22 04/08/2015    Lab Results  Component Value Date   HGBA1C 6.1 01/08/2016   Lipid Panel     Component Value Date/Time   CHOL 215* 04/08/2015 1156   TRIG 331* 04/08/2015 1156   HDL 46 04/08/2015 1156   CHOLHDL 4.7 04/08/2015 1156   VLDL 66* 04/08/2015 1156   LDLCALC 103* 04/08/2015 1156       Assessment and plan:   Chad Cantu was seen today for follow-up.  Diagnoses and all orders for this visit:  Annual physical exam -     PSA -     CBC with Differential -     Hemoccult - 1 Card (office) Smoking cessation advised--discussed for 3 minutes  Elevated blood pressure -     cloNIDine (CATAPRES) tablet 0.1 mg; Take 1 tablet (0.1 mg total) by mouth once in office  Essential hypertension -     lisinopril-hydrochlorothiazide (PRINZIDE,ZESTORETIC) 10-12.5 MG tablet; Take 1 tablet by mouth daily. Must have office visit for more refills -     Basic Metabolic Panel BP elevated today but was normal last week. I will not make changes at this time but I have advised him to check his pressures and let me know if they continue to be elevated.  DASH diet, smoking cessation  HLD (hyperlipidemia) -     atorvastatin (LIPITOR) 10 MG tablet; Take 1 tablet (10 mg total) by mouth daily. -     Lipid panel Education provided on proper lifestyle changes in order to lower cholesterol. Patient advised to maintain healthy weight and to keep total fat intake at 25-35% of total calories and carbohydrates 50-60% of total daily calories. Explained how high cholesterol places patient at risk for heart disease. Patient placed on appropriate medication and repeat labs in 6 months   Tobacco use See above     Return in about 3 months (around 04/14/2016) for Hypertension.   Lance Bosch, [facility] [phone] 01/16/2016, 10:30 AM

## 2016-01-16 NOTE — Progress Notes (Signed)
Patient here for his annual check up Presents in office with elevated blood pressure 0.1mg  catapress given per office protocol

## 2016-01-16 NOTE — Patient Instructions (Signed)
Smoking Cessation, Tips for Success If you are ready to quit smoking, congratulations! You have chosen to help yourself be healthier. Cigarettes bring nicotine, tar, carbon monoxide, and other irritants into your body. Your lungs, heart, and blood vessels will be able to work better without these poisons. There are many different ways to quit smoking. Nicotine gum, nicotine patches, a nicotine inhaler, or nicotine nasal spray can help with physical craving. Hypnosis, support groups, and medicines help break the habit of smoking. WHAT THINGS CAN I DO TO MAKE QUITTING EASIER?  Here are some tips to help you quit for good:  Pick a date when you will quit smoking completely. Tell all of your friends and family about your plan to quit on that date.  Do not try to slowly cut down on the number of cigarettes you are smoking. Pick a quit date and quit smoking completely starting on that day.  Throw away all cigarettes.   Clean and remove all ashtrays from your home, work, and car.  On a card, write down your reasons for quitting. Carry the card with you and read it when you get the urge to smoke.  Cleanse your body of nicotine. Drink enough water and fluids to keep your urine clear or pale yellow. Do this after quitting to flush the nicotine from your body.  Learn to predict your moods. Do not let a bad situation be your excuse to have a cigarette. Some situations in your life might tempt you into wanting a cigarette.  Never have "just one" cigarette. It leads to wanting another and another. Remind yourself of your decision to quit.  Change habits associated with smoking. If you smoked while driving or when feeling stressed, try other activities to replace smoking. Stand up when drinking your coffee. Brush your teeth after eating. Sit in a different chair when you read the paper. Avoid alcohol while trying to quit, and try to drink fewer caffeinated beverages. Alcohol and caffeine may urge you to  smoke.  Avoid foods and drinks that can trigger a desire to smoke, such as sugary or spicy foods and alcohol.  Ask people who smoke not to smoke around you.  Have something planned to do right after eating or having a cup of coffee. For example, plan to take a walk or exercise.  Try a relaxation exercise to calm you down and decrease your stress. Remember, you may be tense and nervous for the first 2 weeks after you quit, but this will pass.  Find new activities to keep your hands busy. Play with a pen, coin, or rubber band. Doodle or draw things on paper.  Brush your teeth right after eating. This will help cut down on the craving for the taste of tobacco after meals. You can also try mouthwash.   Use oral substitutes in place of cigarettes. Try using lemon drops, carrots, cinnamon sticks, or chewing gum. Keep them handy so they are available when you have the urge to smoke.  When you have the urge to smoke, try deep breathing.  Designate your home as a nonsmoking area.  If you are a heavy smoker, ask your health care provider about a prescription for nicotine chewing gum. It can ease your withdrawal from nicotine.  Reward yourself. Set aside the cigarette money you save and buy yourself something nice.  Look for support from others. Join a support group or smoking cessation program. Ask someone at home or at work to help you with your plan   to quit smoking.  Always ask yourself, "Do I need this cigarette or is this just a reflex?" Tell yourself, "Today, I choose not to smoke," or "I do not want to smoke." You are reminding yourself of your decision to quit.  Do not replace cigarette smoking with electronic cigarettes (commonly called e-cigarettes). The safety of e-cigarettes is unknown, and some may contain harmful chemicals.  If you relapse, do not give up! Plan ahead and think about what you will do the next time you get the urge to smoke. HOW WILL I FEEL WHEN I QUIT SMOKING? You  may have symptoms of withdrawal because your body is used to nicotine (the addictive substance in cigarettes). You may crave cigarettes, be irritable, feel very hungry, cough often, get headaches, or have difficulty concentrating. The withdrawal symptoms are only temporary. They are strongest when you first quit but will go away within 10-14 days. When withdrawal symptoms occur, stay in control. Think about your reasons for quitting. Remind yourself that these are signs that your body is healing and getting used to being without cigarettes. Remember that withdrawal symptoms are easier to treat than the major diseases that smoking can cause.  Even after the withdrawal is over, expect periodic urges to smoke. However, these cravings are generally short lived and will go away whether you smoke or not. Do not smoke! WHAT RESOURCES ARE AVAILABLE TO HELP ME QUIT SMOKING? Your health care provider can direct you to community resources or hospitals for support, which may include:  Group support.  Education.  Hypnosis.  Therapy.   This information is not intended to replace advice given to you by your health care provider. Make sure you discuss any questions you have with your health care provider.   Document Released: 08/27/2004 Document Revised: 12/20/2014 Document Reviewed: 05/17/2013 Elsevier Interactive Patient Education 2016 [employer]  

## 2016-01-17 LAB — PSA: PSA: 0.84 ng/mL (ref <=4.00)

## 2016-01-20 ENCOUNTER — Telehealth: Payer: Self-pay

## 2016-01-20 MED ORDER — OMEGA-3-ACID ETHYL ESTERS 1 G PO CAPS: 2.0000 g | ORAL_CAPSULE | Freq: Two times a day (BID) | ORAL | Status: DC

## 2016-01-20 MED ORDER — ATORVASTATIN CALCIUM 20 MG PO TABS: 20.0000 mg | ORAL_TABLET | Freq: Every day | ORAL | Status: DC

## 2016-01-20 MED FILL — ATORVASTATIN 20 MG TABLET: 20 | 30 days supply | Qty: 30 | Fill #0

## 2016-01-20 MED FILL — OMEGA-3 ETHYL ESTERS 1 GM C: 1 | 15 days supply | Qty: 60 | Fill #0

## 2016-01-20 NOTE — Telephone Encounter (Signed)
-----   Message from Lance Bosch, NP sent at 01/19/2016  5:38 PM EST ----- Please increase Lipitor to 20 mg daily. Cholesterol is elevated more that last year. Please stress the importance of having normal cholesterol. Also please send Lovaza 2 grams BID with meals. Go over diet specifics. I will recheck his cholesterol in 3 months

## 2016-01-20 NOTE — Telephone Encounter (Signed)
Tried to contact patient this am Patient not available  Message left on voice mail to return our call 

## 2016-01-28 MED FILL — LISINOPRIL-HCTZ 10-12.5 MG: 10-12.5 | 30 days supply | Qty: 30 | Fill #0

## 2016-01-29 ENCOUNTER — Other Ambulatory Visit: Payer: Self-pay | Admitting: Internal Medicine

## 2016-01-31 ENCOUNTER — Encounter (HOSPITAL_COMMUNITY): Payer: Self-pay | Admitting: Emergency Medicine

## 2016-01-31 ENCOUNTER — Emergency Department (HOSPITAL_COMMUNITY)
Admission: EM | Admit: 2016-01-31 | Discharge: 2016-01-31 | Disposition: A | Discharge status: Home / Self Care | Payer: BLUE CROSS/BLUE SHIELD | Attending: Emergency Medicine | Admitting: Emergency Medicine

## 2016-01-31 DIAGNOSIS — Z791 Long term (current) use of non-steroidal anti-inflammatories (NSAID): Secondary | ICD-10-CM | POA: Insufficient documentation

## 2016-01-31 DIAGNOSIS — I10 Essential (primary) hypertension: Secondary | ICD-10-CM | POA: Insufficient documentation

## 2016-01-31 DIAGNOSIS — R000 Tachycardia, unspecified: Secondary | ICD-10-CM | POA: Insufficient documentation

## 2016-01-31 DIAGNOSIS — F10220 Alcohol dependence with intoxication, uncomplicated: Secondary | ICD-10-CM | POA: Diagnosis not present

## 2016-01-31 DIAGNOSIS — F1010 Alcohol abuse, uncomplicated: Secondary | ICD-10-CM | POA: Diagnosis present

## 2016-01-31 DIAGNOSIS — F17200 Nicotine dependence, unspecified, uncomplicated: Secondary | ICD-10-CM | POA: Diagnosis not present

## 2016-01-31 DIAGNOSIS — E785 Hyperlipidemia, unspecified: Secondary | ICD-10-CM | POA: Diagnosis not present

## 2016-01-31 DIAGNOSIS — Z79899 Other long term (current) drug therapy: Secondary | ICD-10-CM | POA: Insufficient documentation

## 2016-01-31 LAB — COMPREHENSIVE METABOLIC PANEL
ALBUMIN: 4.6 g/dL (ref 3.5–5.0)
ALT: 46 U/L (ref 17–63)
ANION GAP: 10 (ref 5–15)
AST: 49 U/L — ABNORMAL HIGH (ref 15–41)
Alkaline Phosphatase: 71 U/L (ref 38–126)
BUN: 9 mg/dL (ref 6–20)
CO2: 23 mmol/L (ref 22–32)
Calcium: 9.1 mg/dL (ref 8.9–10.3)
Chloride: 109 mmol/L (ref 101–111)
Creatinine, Ser: 0.81 mg/dL (ref 0.61–1.24)
GFR calc Af Amer: >60 mL/min (ref >60)
GFR calc non Af Amer: >60 mL/min (ref >60)
GLUCOSE: 120 mg/dL — AB (ref 65–99)
POTASSIUM: 4.7 mmol/L (ref 3.5–5.1)
Sodium: 142 mmol/L (ref 135–145)
Total Bilirubin: 0.5 mg/dL (ref 0.3–1.2)
Total Protein: 8.4 g/dL — ABNORMAL HIGH (ref 6.5–8.1)

## 2016-01-31 LAB — CBC
HCT: 43.4 % (ref 39.0–52.0)
Hemoglobin: 14.6 g/dL (ref 13.0–17.0)
MCH: 30.5 pg (ref 26.0–34.0)
MCHC: 33.6 g/dL (ref 30.0–36.0)
MCV: 90.6 fL (ref 78.0–100.0)
Platelets: 260 10*3/uL (ref 150–400)
RBC: 4.79 MIL/uL (ref 4.22–5.81)
RDW: 13.5 % (ref 11.5–15.5)
WBC: 8 10*3/uL (ref 4.0–10.5)

## 2016-01-31 LAB — ETHANOL: ALCOHOL ETHYL (B): 243 mg/dL — AB (ref <5)

## 2016-01-31 MED ORDER — DIAZEPAM 5 MG PO TABS
10.0000 mg | ORAL_TABLET | Freq: Once | ORAL | Status: AC
  Administered 2016-01-31: 10 mg via ORAL
  Filled 2016-01-31: qty 2

## 2016-01-31 MED ORDER — CHLORDIAZEPOXIDE HCL 25 MG PO CAPS: ORAL_CAPSULE | ORAL | Status: DC

## 2016-01-31 NOTE — ED Notes (Signed)
Pt presents with wife requesting detox from [facility]. Consumption reports vary, pt states 6 large cans of beer daily, wife states much more. Last drink just PTA. Pt states he wakes up sweating and shaking @ 7am and begins drinking. Pt denies seizures. Heavy drinking hx since 2006.

## 2016-01-31 NOTE — Discharge Instructions (Signed)
Emergency Department Resource Guide 1) Find a Doctor and Pay Out of Pocket Although you won't have to find out who is covered by your insurance plan, it is a good idea to ask around and get recommendations. You will then need to call the office and see if the doctor you have chosen will accept you as a new patient and what types of options they offer for patients who are self-pay. Some doctors offer discounts or will set up payment plans for their patients who do not have insurance, but you will need to ask so you aren't surprised when you get to your appointment.  2) Contact Your Local Health Department Not all health departments have doctors that can see patients for sick visits, but many do, so it is worth a call to see if yours does. If you don't know where your local health department is, you can check in your phone book. The CDC also has a tool to help you locate your state's health department, and many state websites also have listings of all of their local health departments.  3) Find a [facility] If your illness is not likely to be very severe or complicated, you may want to try a walk in clinic. These are popping up all over the country in pharmacies, drugstores, and shopping centers. They're usually staffed by nurse practitioners or physician assistants that have been trained to treat common illnesses and complaints. They're usually fairly quick and inexpensive. However, if you have serious medical issues or chronic medical problems, these are probably not your best option.  No Primary Care Doctor: - Call Health Connect at  [phone] - they can help you locate a primary care doctor that  accepts your insurance, provides certain services, etc. - Physician Referral Service- [phone]  Chronic Pain Problems: Organization         Address  Phone   Notes  [facility]  [phone] Patients need to be referred by their primary care doctor.   Medication  Assistance: Organization         Address  Phone   Notes  [facility] Medication [facility] [address] [place], [place] [address] [phone] --Must be a resident of [facility] -- Must have NO insurance coverage whatsoever (no Medicaid/ Medicare, etc.) -- The pt. MUST have a primary care doctor that directs their care regularly and follows them in the community   [facility]  [phone]   [employer]  [phone]    Agencies that provide inexpensive medical care: Organization         Address  Phone   Notes  [place]  [phone]   [facility]    [phone]   [facility] [place], [place] [address] [phone]   [place] [address] [place] [phone]   [facility]    [phone]   [facility]    [phone]   [place] [address], [place] Phone:  [phone], Fax:  [phone] Hours of Operation:  9 am - 6 pm, M-F.  Also accepts Medicaid/Medicare and self-pay.  [facility] for [address], [address], [place] Phone: [phone], Fax: [phone]. Hours of Operation:  8:30 am - 5:30 pm, M-F.  Also accepts Medicaid and self-pay.  [facility] [place] [address], [place] Phone: [phone]   °[facility] [address], [place], [place] [phone], Ext. 123 Mondays & Thursdays: 7-9 AM.  First 15 patients are seen on a first come, first serve basis. °  ° °Medicaid-accepting [place] Providers: ° °Organization         Address  Phone   Notes  °[facility] [address], [place] [phone] Also accepts self-pay patients.  °[facility] [address], [place] ° ([phone]) [phone]   °[facility] [address], [place]  ([phone]) [phone]   °[facility] [address] [place] [address], [place] ([phone]) [phone]   °Veita Bland [address], [place]  ° ([phone]) [phone] Only accepts [place] Access Medicaid patients after they have their name applied to their card.  ° °Self-Pay (no insurance) in [place]: ° °Organization         Address  Phone   Notes  °Sickle Cell Patients, [facility] [address], [place] ([phone]) [phone]   °[facility] [address], [place] ([phone]) [phone]   °[facility] ° [address], [place] ([phone]) [phone]   °[facility]/Dr. Osei-Bonsu ° [address] [place] [address], [place] or [address], [place] [phone]) [phone] Phone number for both [place] and [place] locations is the same.  °[facility] [address], [place] ([phone]) [phone]   °[facility] [address] [place] [address], [place] or [address] ([phone]) [phone] °(336) [phone]   °[facility] [address], [place] (336) [phone], phone; ([phone]) [phone], fax Sees patients 1st and 3rd Saturday of every month.  Must not qualify for public or private insurance (i.e. Medicaid, Medicare, [place] Health Choice, Veterans' Benefits) • Household income should be no more than 200% of the poverty level •The clinic cannot treat you if you are pregnant or think you are pregnant • Sexually transmitted diseases are not treated at the clinic.  ° ° °Dental Care: °Organization         Address  Phone  Notes  °[place] [facility] [address], [place] ([phone]) [phone] Accepts children up to age 21 who are enrolled in Medicaid or [place] Health Choice; pregnant women with a Medicaid card; and children who have applied for Medicaid or [place] Health Choice, but were declined, whose parents can pay a reduced fee at time of service.  °[place]  [facility] [place]  [address], [place] (336) [phone] Accepts children up to age 21 who are enrolled in Medicaid or [place] Health Choice; pregnant women with a Medicaid card; and children who have applied for Medicaid or [place] Health Choice, but were declined, whose parents can pay a reduced fee at time of service.  °[facility] ° [address],  ([phone]) [phone] Patients are seen by appointment only. Walk-ins are not accepted. [facility] will see patients 18 years of age and older. °Monday - Tuesday (8am-5pm) °Most Wednesdays (8:30-5pm) °$30 per visit, cash only  °[facility] ° [address], [place] [phone]) [phone] Patients are seen by appointment only. Walk-ins are not accepted. [facility] will see patients 18 years of age and older. °One   Wednesday Evening (Monthly: Volunteer Based).  $30 per visit, cash only  °[facility]  ([phone] for adults; Children under age 4, call Graduate Pediatric Dentistry at [phone]. Children aged 4-14, please call [phone] to request a pediatric application. ° Dental services are provided in all areas of dental care including fillings, crowns and bridges, complete and partial dentures, implants, gum treatment, root canals, and extractions. Preventive care is also provided. Treatment is provided to both adults and children. °Patients are selected via a lottery and there is often a waiting list. °  °[facility] [address], °[place] ° ([phone]) 763-8833 www.drcivils.com °  °[facility] [address], [place], [place] (336)[phone], Ext. 123 Second and Fourth Thursday of each month, opens at 6:30 AM; Clinic ends at 9 AM.  Patients are seen on a first-come first-served basis, and a limited number are seen during each clinic.  ° °[facility] ° [address], [place], [place] ([phone]) [phone]    Eligibility Requirements °You must have lived in [place], [place], or [place] counties for at least the last three months. °  You cannot be eligible for state or federal sponsored healthcare insurance, including Veterans Administration, Medicaid, or Medicare. °  You generally cannot be eligible for healthcare insurance through your employer.  °  How to apply: °Eligibility screenings are held every Tuesday and Wednesday afternoon from 1:00 pm until 4:00 pm. You do not need an appointment for the interview!  °[facility] [address], [place], [place] [phone]   °[facility]  336-342-8273   °[facility]  [phone]   °[facility]  336-570-6415   ° °Behavioral Health Resources in the Community: °Intensive Outpatient Programs °Organization         Address  Phone  Notes  °[facility] [address], [place], [place] [phone]   °[facility] [address], [place], [place] [phone]   °[facility] [address], [place], [place] ° 336-882-2125   °[facility] [address],  °[place], [place] 1-800-853-5163 or 336-641-4981   °Substance Abuse Resources °Organization         Address  Phone  Notes  °[facility]  336-882-2125   °[facility]  336-784-9470   °[facility]  336-285-9073   °[facility]  336-845-3988   °Residential & Outpatient Substance Abuse Program  1-800-659-3381   °Psychological Services °Organization         Address  Phone  Notes  °[facility]  336- 832-9600   °[facility]  336- 378-7881   °[facility] [address], [place] 1-800-853-5163 or 336-641-4981   ° °Mobile Crisis Teams °Organization         Address  Phone  Notes  °[facility], Mobile Crisis Care Unit  1-877-626-1772   °Assertive °[facility] ° [address]  [place], [place] 336-834-9664   °Sharon DeEsch [address] °[place] 336-554-5454   ° °Self-Help/Support Groups °Organization         Address  Phone             Notes  °[facility] [place] - variety of support groups  336- 373-1402 Call for more information  °Narcotics Anonymous (NA), [facility] [address], °[place]  2 meetings at this location  ° °  Residential Treatment Programs Organization         Address  Phone  Notes  [facility] [address],    [place]  1-(704) [phone]   [facility]  [address], [place] [id number], [place], [place]   [place], [place] [phone] Admissions: 8am-3pm M-F  [facility] [place] [address]    [place], [place] [phone]   [facility] [address] [place], [place], [place]   [facility] [address],  [place], [place]   [facility] - Intensive Outpatient [address], [place], [place]   [facility] [place]  [place], [place] 1-951-702-2164 or ([phone]   [facility] [address] [place], [place] Accepts Medicaid  [facility] [address],  [place] [phone] Substance Abuse/Addiction Treatment   [facility] Organization         Address  Phone  Notes  [facility]  [phone]   Domenic Schwab, PhD [address] Arlis [address], [place]   5310300837 or [phone]   [facility] [address] [place], [place] [phone]   [facility] [address] [place], [place] [phone] Insurance/Medicaid/sponsorship through [facility] Families [address], [address] [place] [place]                                    [place], [place] [phone] [place] [address], [place] [phone]    Dr. Adele Schilder  [phone]   [facility] Dept. 1) [address] [place] 2) [place] 3)  [place] [address], [place] [phone]  [phone]   [place] [phone] or [phone] (After Hours)          Finding Treatment for Addiction WHAT IS ADDICTION? Addiction is a complex disease of the brain. It causes an uncontrollable (compulsive) need for a substance. You can be addicted to alcohol, illegal drugs, or prescription medicines such as painkillers. Addiction can also be a behavior, like gambling or shopping. The need for the drug or activity can become so strong that you think about it all the time. You can also become physically dependent on a substance. Addiction can change the way your brain works. Because of these changes, getting more of whatever you are addicted to becomes the most important thing to you and feels better than other activities or relationships. Addiction can lead to changes in health, behavior, emotions, relationships, and choices that affect you and everyone around you. HOW DO I KNOW IF I NEED TREATMENT FOR ADDICTION? Addiction is a progressive disease. Without treatment, addiction can get worse. Living with addiction puts you at higher risk for injury, poor health, lost employment, loss of money, and even death. You might need treatment for addiction if:  You have tried to stop or cut down, but you cannot.  Your addiction is causing physical health problems.  You find it annoying that your friends and family are concerned about your alcohol or substance use.  You feel guilty about substance abuse or a compulsive behavior.  You have lied or tried to hide your addiction.  You need a particular substance or activity to start your day or to calm down.  You are  getting in trouble at school, work, home, or with the police.  You have done something illegal to support your addiction.  You are running out of money because of your addiction.  You have no time for anything other than your addiction. WHAT TYPES OF TREATMENT ARE AVAILABLE? The treatment program that is right for you will depend on many factors, including the type of addiction you have. Treatment programs can be outpatient or inpatient. In an outpatient program, you live at home and go to work or school, but you also go to a clinic for treatment. With an inpatient program, you live and sleep at the program facility during treatment. After treatment, you might need a plan for support during recovery. Other treatment options include:   Medicine.  Some addictions may be treated with prescription medicines.  You might also need medicine to treat anxiety or depression.  Counseling and behavior therapy. Therapy can help individuals and families behave in healthier ways and relate more effectively.  Support groups. Confidential group therapy, such as a 12-step program, can help individuals and families during treatment and recovery. No single type of program is right for everyone. Many treatment programs involve a combination of education, counseling, and a 12-step, spiritually-based approach. Some treatment programs are government sponsored. They are geared for patients who do not have private insurance. Treatment programs can vary in many respects, such as:  Cost and types of insurance that are accepted.  Types of on-site medical services that are offered.  Length of stay, setting, and size.  Overall philosophy of treatment. WHAT SHOULD I CONSIDER WHEN SELECTING A TREATMENT PROGRAM? It is important to think about your individual requirements when selecting a treatment program. There are a number of things to consider, such as:  If the program is certified by the appropriate government  agency. Even private programs must be certified and employ certified professionals.  If the program is covered by your insurance. If finances are a concern, the first call you should make is to your insurance company, if you have health insurance. Ask for a list of treatment programs that are in your network, and confirm any copayments and deductibles that you may have to pay.  If you do not have insurance, or if you choose to attend a program that does not accept your insurance, discuss whether a payment plan can be set up.  If treatment is available in languages other than English, if needed.  If the program offers detoxification treatment, if needed.  If 12-step meetings are held at the center or if transport is available for patients to attend meetings at other locations.  If the program is professional, organized, and clean.  If the program meets all of your needs, including physical and cultural needs.  If the facility offers specific treatment for your particular addiction.  If support continues to be offered after you have left the program.  If your treatment plan is continually looked at to make sure you are receiving the right treatment at the right time.  If mental health counseling is part of your treatment.  If medicine is included in treatment, if needed.  If your family is included in your treatment plan and if support is offered to them throughout the treatment process.  How the treatment works to prevent relapse. WHERE ELSE CAN  I GET HELP?  Your health care provider. Ask him or her to help you find addiction treatment. These discussions are confidential.  The [employer] on Alcoholism and Drug Dependence (NCADD). This group has information about treatment centers and programs for people who have an addiction and for family members.  The telephone number is 1-800-NCA-CALL (762-457-4131).  The website is https://ncadd.org/about-ncadd/our-affiliates  The  Substance Abuse and Mental Health Services Administration [facility]). This group will help you find publicly funded treatment centers, help hotlines, and counseling services near you.  The telephone number is 1-800-662-HELP 223-142-5670).  The website is www.findtreatment.[place].com.cy In countries outside of the [place] and [place], look in [employer] for contact information for services in your area.   This information is not intended to replace advice given to you by your health care provider. Make sure you discuss any questions you have with your health care provider.   Document Released: 10/28/2005 Document Revised: 08/20/2015 Document Reviewed: 09/17/2014 Elsevier Interactive Patient Education [employer].

## 2016-02-02 ENCOUNTER — Telehealth: Payer: Self-pay | Admitting: Internal Medicine

## 2016-02-02 NOTE — Telephone Encounter (Signed)
Please call this patient and set him up with a outpatient alcohol abuse center very soon. He wants to continue Librium for alcohol dependence.

## 2016-02-02 NOTE — Telephone Encounter (Signed)
Patient called requesting a refill on his librium Do we fill that or is it a specialist

## 2016-02-02 NOTE — Telephone Encounter (Signed)
Pt. Called requesting a med refill on chlordiazePOXIDE (LIBRIUM) 25 MG capsule. Please f/u with pt.

## 2016-02-03 ENCOUNTER — Telehealth: Payer: Self-pay | Admitting: Clinical

## 2016-02-03 NOTE — Telephone Encounter (Signed)
Patient referred to [facility], as they will prescribe and monitor Librium usage; [place] says for pt to call them at [phone] to set up an appointment. Chad Cantu states verbal understanding and agrees to call Doctor Phillips to make an appointment.

## 2016-02-05 NOTE — ED Provider Notes (Signed)
CSN: [id number]     Arrival date & time 01/31/16  2017 History   First MD Initiated Contact with Patient 01/31/16 2208     Chief Complaint  Patient presents with  . ETOH detox request      (Consider location/radiation/quality/duration/timing/severity/associated sxs/prior Treatment) HPI   65 year old male presenting  Requesting alcohol detox. Patient has been drinking for many years and has not stopped for any significant amount of time in months to years. He reports that he'll wake up feeling shaky and sometimes sweaty and has to have a drink soon after to calm himself. He continues during throughout the day. Denies any hallucinations or previous seizure. Denies any other significant substance abuse aside from occasional marijuana. There is no specific stressor or event leading to him presenting to the emergency room today.  Past Medical History  Diagnosis Date  . Hyperlipidemia   . Hypertension    Past Surgical History  Procedure Laterality Date  . Laparoscopic gastrotomy w/ repair of ulcer     Family History  Problem Relation Age of Onset  . Diabetes Mother    Social History  Substance Use Topics  . Smoking status: Heavy Tobacco Smoker -- 1.00 packs/day  . Smokeless tobacco: None  . Alcohol Use: 1.5 oz/week    3 Standard drinks or equivalent per week    Review of Systems  All systems reviewed and negative, other than as noted in HPI.   Allergies  Review of patient's allergies indicates no known allergies.  Home Medications   Prior to Admission medications   Medication Sig Start Date End Date Taking? Authorizing Provider  atorvastatin (LIPITOR) 20 MG tablet Take 1 tablet (20 mg total) by mouth daily. 01/20/16  Yes Lance Bosch, NP  lisinopril-hydrochlorothiazide (PRINZIDE,ZESTORETIC) 10-12.5 MG tablet Take 1 tablet by mouth daily. Must have office visit for more refills 01/16/16  Yes Lance Bosch, NP  meloxicam (MOBIC) 15 MG tablet Take 1 tablet (15 mg total) by  mouth daily. 01/08/16  Yes Lance Bosch, NP  omega-3 acid ethyl esters (LOVAZA) 1 g capsule Take 2 capsules (2 g total) by mouth 2 (two) times daily. 01/20/16  Yes Lance Bosch, NP  traMADol (ULTRAM) 50 MG tablet TAKE 1 TABLET BY MOUTH EVERY 12 HOURS AS NEEDED FOR SEVERE PAIN 01/30/16  Yes Lance Bosch, NP  amoxicillin (AMOXIL) 500 MG capsule Take 1 capsule (500 mg total) by mouth 3 (three) times daily. Patient not taking: Reported on 04/08/2015 04/29/14   Lance Bosch, NP  chlordiazePOXIDE (LIBRIUM) 25 MG capsule 50mg  PO TID x 1D, then 25-50mg  PO BID X 1D, then 25-50mg  PO QD X 1D 01/31/16   Virgel Manifold, MD  cyclobenzaprine (FLEXERIL) 5 MG tablet Take 1 tablet (5 mg total) by mouth 3 (three) times daily as needed for muscle spasms. Patient not taking: Reported on 04/08/2015 12/10/13   Reyne Dumas, MD  triamcinolone cream (KENALOG) 0.1 % Apply 1 application topically 2 (two) times daily. Patient not taking: Reported on 01/31/2016 04/08/15   Lance Bosch, NP   BP 146/95 mmHg  Pulse 110  Temp(Src) 99.2 F (37.3 C) (Oral)  Resp 20  Ht 5\' 6"  (1.676 m)  Wt 170 lb (77.111 kg)  BMI 27.45 kg/m2  SpO2 100% Physical Exam  Constitutional: He appears well-developed and well-nourished. No distress.  HENT:  Head: Normocephalic and atraumatic.  Eyes: Conjunctivae are normal. Right eye exhibits no discharge. Left eye exhibits no discharge.  Neck: Neck supple.  Cardiovascular:  Regular rhythm and normal heart sounds.  Exam reveals no gallop and no friction rub.   No murmur heard. mild tachycardia  Pulmonary/Chest: Effort normal and breath sounds normal. No respiratory distress.  Abdominal: Soft. He exhibits no distension. There is no tenderness.  Musculoskeletal: He exhibits no edema or tenderness.  Neurological: He is alert.  Skin: Skin is warm and dry.  Psychiatric: He has a normal mood and affect. His behavior is normal. Thought content normal.  Nursing note and vitals reviewed.   ED Course   Procedures (including critical care time) Labs Review Labs Reviewed  ETHANOL - Abnormal; Notable for the following:    Alcohol, Ethyl (B) 243 (*)    All other components within normal limits  COMPREHENSIVE METABOLIC PANEL - Abnormal; Notable for the following:    Glucose, Bld 120 (*)    Total Protein 8.4 (*)    AST 49 (*)    All other components within normal limits  CBC    Imaging Review No results found. I have personally reviewed and evaluated these images and lab results as part of my medical decision-making.   EKG Interpretation None      MDM   Final diagnoses:  Alcohol dependence with uncomplicated intoxication ([place])    65 year old male presented radical detox. He is mildly tachycardic and vitals noted to be hypertensive.He himself though does not feel like he is significantly withdrawing.  He is not suicidal or homicidal. Prescribed Librium to help with possible withdrawal symptoms. Is provided with resource list and encouragement. Return precautions were discussed.    Virgel Manifold, MD 02/05/16 2227

## 2016-02-09 ENCOUNTER — Ambulatory Visit: Payer: Self-pay | Admitting: Internal Medicine

## 2016-02-12 MED FILL — traMADol HCL 50 MG TABS: 50 | 1 days supply | Qty: 60 | Fill #0

## 2016-03-11 ENCOUNTER — Ambulatory Visit
Admission: RE | Admit: 2016-03-11 | Discharge: 2016-03-11 | Disposition: A | Discharge status: Home / Self Care | Payer: No Typology Code available for payment source | Source: Ambulatory Visit | Attending: *Deleted | Admitting: *Deleted

## 2016-03-11 ENCOUNTER — Other Ambulatory Visit: Payer: Self-pay | Admitting: *Deleted

## 2016-03-11 DIAGNOSIS — Z9289 Personal history of other medical treatment: Secondary | ICD-10-CM

## 2016-04-01 MED FILL — levETIRAcetam 500 MG TABS: 500 | 7 days supply | Qty: 11 | Fill #0

## 2016-04-05 MED FILL — metFORMIN HCL 850 MG TABS: 850 | 30 days supply | Qty: 60 | Fill #0

## 2016-04-07 MED FILL — ATORVASTATIN 20 MG TABLET: 20 | 30 days supply | Qty: 30 | Fill #0

## 2016-04-07 MED FILL — LISINOPRIL-HCTZ 10-12.5 MG: 10-12.5 | 30 days supply | Qty: 30 | Fill #0

## 2016-04-07 MED FILL — OMEGA-3 ETHYL ESTERS 1 GM C: 1 | 30 days supply | Qty: 60 | Fill #0

## 2016-05-04 ENCOUNTER — Ambulatory Visit
Admission: RE | Admit: 2016-05-04 | Discharge: 2016-05-04 | Disposition: A | Discharge status: Home / Self Care | Payer: No Typology Code available for payment source | Source: Ambulatory Visit | Attending: Infectious Disease | Admitting: Infectious Disease

## 2016-05-04 ENCOUNTER — Other Ambulatory Visit: Payer: Self-pay | Admitting: Infectious Disease

## 2016-05-04 DIAGNOSIS — R7611 Nonspecific reaction to tuberculin skin test without active tuberculosis: Secondary | ICD-10-CM

## 2016-05-04 MED FILL — OMEGA-3 ETHYL ESTERS 1 GM C: 1 | 30 days supply | Qty: 60 | Fill #1

## 2016-05-04 MED FILL — metFORMIN HCL 850 MG TABS: 850 | 30 days supply | Qty: 60 | Fill #1

## 2016-05-21 MED FILL — LISINOPRIL-HCTZ 10-12.5 MG: 10-12.5 | 30 days supply | Qty: 30 | Fill #1

## 2016-05-21 MED FILL — ATORVASTATIN 20 MG TABLET: 20 | 30 days supply | Qty: 30 | Fill #1

## 2016-06-16 MED FILL — ATORVASTATIN 20 MG TABLET: 20 | 30 days supply | Qty: 30 | Fill #1

## 2016-06-16 MED FILL — OMEGA-3 ETHYL ESTERS 1 GM C: 1 | 15 days supply | Qty: 60 | Fill #1

## 2016-06-16 MED FILL — LISINOPRIL-HCTZ 10-12.5 MG: 10-12.5 | 30 days supply | Qty: 30 | Fill #1

## 2016-07-15 MED FILL — LISINOPRIL-HCTZ 10-12.5 MG: 10-12.5 | 30 days supply | Qty: 30 | Fill #2

## 2016-07-15 MED FILL — OMEGA-3 ETHYL ESTERS 1 GM C: 1 | 15 days supply | Qty: 60 | Fill #2

## 2016-07-15 MED FILL — ATORVASTATIN 20 MG TABLET: 20 | 30 days supply | Qty: 30 | Fill #2

## 2016-08-11 ENCOUNTER — Other Ambulatory Visit: Payer: Self-pay | Admitting: Internal Medicine

## 2016-08-17 MED FILL — ATORVASTATIN 20 MG TABLET: 20 | 30 days supply | Qty: 30 | Fill #3

## 2016-08-17 MED FILL — OMEGA-3 ETHYL ESTERS 1 GM C: 1 | 30 days supply | Qty: 120 | Fill #0

## 2016-08-17 MED FILL — LISINOPRIL-HCTZ 10-12.5 MG: 10-12.5 | 30 days supply | Qty: 30 | Fill #3

## 2016-09-09 ENCOUNTER — Encounter: Payer: Self-pay | Admitting: Internal Medicine

## 2016-09-09 ENCOUNTER — Ambulatory Visit: Payer: BLUE CROSS/BLUE SHIELD | Attending: Internal Medicine | Admitting: Internal Medicine

## 2016-09-09 VITALS — BP 135/82 | HR 84 | Temp 98.5°F | Resp 16 | Ht 66.0 in | Wt 175.4 lb

## 2016-09-09 DIAGNOSIS — F17210 Nicotine dependence, cigarettes, uncomplicated: Secondary | ICD-10-CM | POA: Diagnosis not present

## 2016-09-09 DIAGNOSIS — Z79899 Other long term (current) drug therapy: Secondary | ICD-10-CM | POA: Diagnosis not present

## 2016-09-09 DIAGNOSIS — R7611 Nonspecific reaction to tuberculin skin test without active tuberculosis: Secondary | ICD-10-CM | POA: Diagnosis not present

## 2016-09-09 DIAGNOSIS — R195 Other fecal abnormalities: Secondary | ICD-10-CM | POA: Diagnosis not present

## 2016-09-09 DIAGNOSIS — R7303 Prediabetes: Secondary | ICD-10-CM | POA: Insufficient documentation

## 2016-09-09 DIAGNOSIS — Z1211 Encounter for screening for malignant neoplasm of colon: Secondary | ICD-10-CM | POA: Diagnosis not present

## 2016-09-09 DIAGNOSIS — Z114 Encounter for screening for human immunodeficiency virus [HIV]: Secondary | ICD-10-CM | POA: Diagnosis not present

## 2016-09-09 DIAGNOSIS — I10 Essential (primary) hypertension: Secondary | ICD-10-CM | POA: Diagnosis not present

## 2016-09-09 DIAGNOSIS — E785 Hyperlipidemia, unspecified: Secondary | ICD-10-CM | POA: Diagnosis not present

## 2016-09-09 DIAGNOSIS — F17200 Nicotine dependence, unspecified, uncomplicated: Secondary | ICD-10-CM

## 2016-09-09 DIAGNOSIS — Z1159 Encounter for screening for other viral diseases: Secondary | ICD-10-CM

## 2016-09-09 DIAGNOSIS — Z227 Latent tuberculosis: Secondary | ICD-10-CM

## 2016-09-09 DIAGNOSIS — Z23 Encounter for immunization: Secondary | ICD-10-CM | POA: Insufficient documentation

## 2016-09-09 DIAGNOSIS — Z720 Tobacco use: Secondary | ICD-10-CM

## 2016-09-09 LAB — POCT GLYCOSYLATED HEMOGLOBIN (HGB A1C): HEMOGLOBIN A1C: 5.9

## 2016-09-09 LAB — HEMOCCULT GUIAC POC 1CARD (OFFICE): FECAL OCCULT BLD: NEGATIVE

## 2016-09-09 MED ORDER — NICOTINE 14 MG/24HR TD PT24: 14.0000 mg | MEDICATED_PATCH | Freq: Every day | TRANSDERMAL | 0 refills | Status: DC

## 2016-09-09 MED ORDER — LISINOPRIL-HYDROCHLOROTHIAZIDE 10-12.5 MG PO TABS: 1.0000 | ORAL_TABLET | Freq: Every day | ORAL | 5 refills | Status: DC

## 2016-09-09 MED ORDER — ATORVASTATIN CALCIUM 20 MG PO TABS: 20.0000 mg | ORAL_TABLET | Freq: Every day | ORAL | 3 refills | Status: DC

## 2016-09-09 MED ORDER — METFORMIN HCL 500 MG PO TABS: 500.0000 mg | ORAL_TABLET | Freq: Two times a day (BID) | ORAL | 3 refills | Status: DC

## 2016-09-09 MED FILL — ATORVASTATIN 20 MG TABLET: 20 | 90 days supply | Qty: 90 | Fill #0

## 2016-09-09 MED FILL — LISINOPRIL-HCTZ 10-12.5 MG: 10-12.5 | 30 days supply | Qty: 30 | Fill #0

## 2016-09-09 MED FILL — metFORMIN HCL 500 MG TABS: 500 | 30 days supply | Qty: 60 | Fill #0

## 2016-09-09 NOTE — Progress Notes (Signed)
Pt here for a annual physical  No pain today  None fasting  Tobacco user 10 cigarette per day  ETOH social  No suicidal thoughts in the past two weeks

## 2016-09-09 NOTE — Patient Instructions (Addendum)
Hypertension Hypertension is another name for high blood pressure. High blood pressure forces your heart to work harder to pump blood. A blood pressure reading has two numbers, which includes a higher number over a lower number (example: 110/72). HOME CARE   Have your blood pressure rechecked by your doctor.  Only take medicine as told by your doctor. Follow the directions carefully. The medicine does not work as well if you skip doses. Skipping doses also puts you at risk for problems.  Do not smoke.  Monitor your blood pressure at home as told by your doctor. GET HELP IF:  You think you are having a reaction to the medicine you are taking.  You have repeat headaches or feel dizzy.  You have puffiness (swelling) in your ankles.  You have trouble with your vision. GET HELP RIGHT AWAY IF:   You get a very bad headache and are confused.  You feel weak, numb, or faint.  You get chest or belly (abdominal) pain.  You throw up (vomit).  You cannot breathe very well. MAKE SURE YOU:   Understand these instructions.  Will watch your condition.  Will get help right away if you are not doing well or get worse.   This information is not intended to replace advice given to you by your health care provider. Make sure you discuss any questions you have with your health care provider.   Document Released: 05/17/2008 Document Revised: 12/04/2013 Document Reviewed: 09/21/2013 Elsevier Interactive Patient Education 2016 [place] DASH stands for "Dietary Approaches to Stop Hypertension." The DASH eating plan is a healthy eating plan that has been shown to reduce high blood pressure (hypertension). Additional health benefits may include reducing the risk of type 2 diabetes mellitus, heart disease, and stroke. The DASH eating plan may also help with weight loss. WHAT DO I NEED TO KNOW ABOUT THE DASH EATING PLAN? For the DASH eating plan, you will follow these  general guidelines:  Choose foods with a percent daily value for sodium of less than 5% (as listed on the food label).  Use salt-free seasonings or herbs instead of table salt or sea salt.  Check with your health care provider or pharmacist before using salt substitutes.  Eat lower-sodium products, often labeled as "lower sodium" or "no salt added."  Eat fresh foods.  Eat more vegetables, fruits, and low-fat dairy products.  Choose whole grains. Look for the word "whole" as the first word in the ingredient list.  Choose fish and skinless chicken or [place] more often than red meat. Limit fish, poultry, and meat to 6 oz (170 g) each day.  Limit sweets, desserts, sugars, and sugary drinks.  Choose heart-healthy fats.  Limit cheese to 1 oz (28 g) per day.  Eat more home-cooked food and less restaurant, buffet, and fast food.  Limit fried foods.  Cook foods using methods other than frying.  Limit canned vegetables. If you do use them, rinse them well to decrease the sodium.  When eating at a restaurant, ask that your food be prepared with less salt, or no salt if possible. WHAT FOODS CAN I EAT? Seek help from a dietitian for individual calorie needs. Grains Whole grain or whole wheat bread. Brown rice. Whole grain or whole wheat pasta. Quinoa, bulgur, and whole grain cereals. Low-sodium cereals. Corn or whole wheat flour tortillas. Whole grain cornbread. Whole grain crackers. Low-sodium crackers. Vegetables Fresh or frozen vegetables (raw, steamed, roasted, or grilled). Low-sodium or  reduced-sodium tomato and vegetable juices. Low-sodium or reduced-sodium tomato sauce and paste. Low-sodium or reduced-sodium canned vegetables.  Fruits All fresh, canned (in natural juice), or frozen fruits. Meat and Other Protein Products Ground beef (85% or leaner), grass-fed beef, or beef trimmed of fat. Skinless chicken or [place]. Ground chicken or [place]. Pork trimmed of fat. All fish and  seafood. Eggs. Dried beans, peas, or lentils. Unsalted nuts and seeds. Unsalted canned beans. Dairy Low-fat dairy products, such as skim or 1% milk, 2% or reduced-fat cheeses, low-fat ricotta or cottage cheese, or plain low-fat yogurt. Low-sodium or reduced-sodium cheeses. Fats and Oils Tub margarines without trans fats. Light or reduced-fat mayonnaise and salad dressings (reduced sodium). Avocado. Safflower, olive, or canola oils. Natural peanut or almond butter. Other Unsalted popcorn and pretzels. The items listed above may not be a complete list of recommended foods or beverages. Contact your dietitian for more options. WHAT FOODS ARE NOT RECOMMENDED? Grains White bread. White pasta. White rice. Refined cornbread. Bagels and croissants. Crackers that contain trans fat. Vegetables Creamed or fried vegetables. Vegetables in a cheese sauce. Regular canned vegetables. Regular canned tomato sauce and paste. Regular tomato and vegetable juices. Fruits Dried fruits. Canned fruit in light or heavy syrup. Fruit juice. Meat and Other Protein Products Fatty cuts of meat. Ribs, chicken wings, bacon, sausage, bologna, salami, chitterlings, fatback, hot dogs, bratwurst, and packaged luncheon meats. Salted nuts and seeds. Canned beans with salt. Dairy Whole or 2% milk, cream, half-and-half, and cream cheese. Whole-fat or sweetened yogurt. Full-fat cheeses or blue cheese. Nondairy creamers and whipped toppings. Processed cheese, cheese spreads, or cheese curds. Condiments Onion and garlic salt, seasoned salt, table salt, and sea salt. Canned and packaged gravies. Worcestershire sauce. Tartar sauce. Barbecue sauce. Teriyaki sauce. Soy sauce, including reduced sodium. Steak sauce. Fish sauce. Oyster sauce. Cocktail sauce. Horseradish. Ketchup and mustard. Meat flavorings and tenderizers. Bouillon cubes. Hot sauce. Tabasco sauce. Marinades. Taco seasonings. Relishes. Fats and Oils Butter, stick margarine,  lard, shortening, ghee, and bacon fat. Coconut, palm kernel, or palm oils. Regular salad dressings. Other Pickles and olives. Salted popcorn and pretzels. The items listed above may not be a complete list of foods and beverages to avoid. Contact your dietitian for more information. WHERE CAN I FIND MORE INFORMATION? National Heart, Lung, and Blood Institute: travelstabloid.com   This information is not intended to replace advice given to you by your health care provider. Make sure you discuss any questions you have with your health care provider.   Document Released: 11/18/2011 Document Revised: 12/20/2014 Document Reviewed: 10/03/2013 Elsevier Interactive Patient Education 2016 Elsevier Inc[employer] - Diabetes Mellitus and Food It is important for you to manage your blood sugar (glucose) level. Your blood glucose level can be greatly affected by what you eat. Eating healthier foods in the appropriate amounts throughout the day at about the same time each day will help you control your blood glucose level. It can also help slow or prevent worsening of your diabetes mellitus. Healthy eating may even help you improve the level of your blood pressure and reach or maintain a healthy weight.  General recommendations for healthful eating and cooking habits include:  Eating meals and snacks regularly. Avoid going long periods of time without eating to lose weight.  Eating a diet that consists mainly of plant-based foods, such as fruits, vegetables, nuts, legumes, and whole grains.  Using low-heat cooking methods, such as baking, instead of high-heat cooking methods, such as deep frying. Work with your dietitian  to make sure you understand how to use the Nutrition Facts information on food labels. HOW CAN FOOD AFFECT ME? Carbohydrates Carbohydrates affect your blood glucose level more than any other type of food. Your dietitian will help you determine how many carbohydrates  to eat at each meal and teach you how to count carbohydrates. Counting carbohydrates is important to keep your blood glucose at a healthy level, especially if you are using insulin or taking certain medicines for diabetes mellitus. Alcohol Alcohol can cause sudden decreases in blood glucose (hypoglycemia), especially if you use insulin or take certain medicines for diabetes mellitus. Hypoglycemia can be a life-threatening condition. Symptoms of hypoglycemia (sleepiness, dizziness, and disorientation) are similar to symptoms of having too much alcohol.  If your health care provider has given you approval to drink alcohol, do so in moderation and use the following guidelines:  Women should not have more than one drink per day, and men should not have more than two drinks per day. One drink is equal to:  12 oz of beer.  5 oz of wine.  1 oz of hard liquor.  Do not drink on an empty stomach.  Keep yourself hydrated. Have water, diet soda, or unsweetened iced tea.  Regular soda, juice, and other mixers might contain a lot of carbohydrates and should be counted. WHAT FOODS ARE NOT RECOMMENDED? As you make food choices, it is important to remember that all foods are not the same. Some foods have fewer nutrients per serving than other foods, even though they might have the same number of calories or carbohydrates. It is difficult to get your body what it needs when you eat foods with fewer nutrients. Examples of foods that you should avoid that are high in calories and carbohydrates but low in nutrients include:  Trans fats (most processed foods list trans fats on the Nutrition Facts label).  Regular soda.  Juice.  Candy.  Sweets, such as cake, pie, doughnuts, and cookies.  Fried foods. WHAT FOODS CAN I EAT? Eat nutrient-rich foods, which will nourish your body and keep you healthy. The food you should eat also will depend on several factors, including:  The calories you need.  The  medicines you take.  Your weight.  Your blood glucose level.  Your blood pressure level.  Your cholesterol level. You should eat a variety of foods, including:  Protein.  Lean cuts of meat.  Proteins low in saturated fats, such as fish, egg whites, and beans. Avoid processed meats.  Fruits and vegetables.  Fruits and vegetables that may help control blood glucose levels, such as apples, mangoes, and yams.  Dairy products.  Choose fat-free or low-fat dairy products, such as milk, yogurt, and cheese.  Grains, bread, pasta, and rice.  Choose whole grain products, such as multigrain bread, whole oats, and brown rice. These foods may help control blood pressure.  Fats.  Foods containing healthful fats, such as nuts, avocado, olive oil, canola oil, and fish. DOES EVERYONE WITH DIABETES MELLITUS HAVE THE SAME MEAL PLAN? Because every person with diabetes mellitus is different, there is not one meal plan that works for everyone. It is very important that you meet with a dietitian who will help you create a meal plan that is just right for you.   This information is not intended to replace advice given to you by your health care provider. Make sure you discuss any questions you have with your health care provider.   Document Released: 08/26/2005 Document Revised:  12/20/2014 Document Reviewed: 10/26/2013 Elsevier Interactive Patient Education 2016 [place] for Eating Away From Home If You Have Diabetes Controlling your level of blood glucose, also known as blood sugar, can be challenging. It can be even more difficult when you do not prepare your own meals. The following tips can help you manage your diabetes when you eat away from home. PLANNING AHEAD Plan ahead if you know you will be eating away from home:  Ask your health care provider how to time meals and medicine if you are taking insulin.  Make a list of restaurants near you that offer healthy choices. If  they have a carry-out menu, take it home and plan what you will order ahead of time.  Look up the restaurant you want to eat at online. Many chain and fast-food restaurants list nutritional information online. Use this information to choose the healthiest options and to calculate how many carbohydrates will be in your meal.  Use a carbohydrate-counting book or mobile app to look up the carbohydrate content and serving size of the foods you want to eat.  Become familiar with serving sizes and learn to recognize how many servings are in a portion. This will allow you to estimate how many carbohydrates you can eat. FREE FOODS A "free food" is any food or drink that has less than 5 g of carbohydrates per serving. Free foods include:  Many vegetables.  Hard boiled eggs.  Nuts or seeds.  Olives.  Cheeses.  Meats. These types of foods make good appetizer choices and are often available at salad bars. Lemon juice, vinegar, or a low-calorie salad dressing of fewer than 20 calories per serving can be used as a "free" salad dressing.  CHOICES TO REDUCE CARBOHYDRATES  Substitute nonfat sweetened yogurt with a sugar-free yogurt. Yogurt made from soy milk may also be used, but you will still want a sugar-free or plain option to choose a lower carbohydrate amount.  Ask your server to take away the bread basket or chips from your table.  Order fresh fruit. A salad bar often offers fresh fruit choices. Avoid canned fruit because it is usually packed in sugar or syrup.  Order a salad, and eat it without dressing. Or, create a "free" salad dressing.  Ask for substitutions. For example, instead of [place] fries, request an order of a vegetable such as salad, green beans, or broccoli. OTHER TIPS   If you take insulin, take the insulin once your food arrives to your table. This will ensure your insulin and food are timed correctly.  Ask your server about the portion size before your order, and ask for  a take-out box if the portion has more servings than you should have. When your food comes, leave the amount you should have on the plate, and put the rest in the take-out box.  Consider splitting an entree with someone and ordering a side salad.   This information is not intended to replace advice given to you by your health care provider. Make sure you discuss any questions you have with your health care provider.   Document Released: 11/29/2005 Document Revised: 08/20/2015 Document Reviewed: 02/26/2014 Elsevier Interactive Patient Education 2016 [employer].   -  Diabetes and Exercise Exercising regularly is important. It is not just about losing weight. It has many health benefits, such as:  Improving your overall fitness, flexibility, and endurance.  Increasing your bone density.  Helping with weight control.  Decreasing your body fat.  Increasing your muscle strength.  Reducing stress and tension.  Improving your overall health. People with diabetes who exercise gain additional benefits because exercise:  Reduces appetite.  Improves the body's use of blood sugar (glucose).  Helps lower or control blood glucose.  Decreases blood pressure.  Helps control blood lipids (such as cholesterol and triglycerides).  Improves the body's use of the hormone insulin by:  Increasing the body's insulin sensitivity.  Reducing the body's insulin needs.  Decreases the risk for heart disease because exercising:  Lowers cholesterol and triglycerides levels.  Increases the levels of good cholesterol (such as high-density lipoproteins [HDL]) in the body.  Lowers blood glucose levels. YOUR ACTIVITY PLAN  Choose an activity that you enjoy, and set realistic goals. To exercise safely, you should begin practicing any new physical activity slowly, and gradually increase the intensity of the exercise over time. Your health care provider or diabetes educator can help create an  activity plan that works for you. General recommendations include:  Encouraging children to engage in at least 60 minutes of physical activity each day.  Stretching and performing strength training exercises, such as yoga or weight lifting, at least 2 times per week.  Performing a total of at least 150 minutes of moderate-intensity exercise each week, such as brisk walking or water aerobics.  Exercising at least 3 days per week, making sure you allow no more than 2 consecutive days to pass without exercising.  Avoiding long periods of inactivity (90 minutes or more). When you have to spend an extended period of time sitting down, take frequent breaks to walk or stretch. RECOMMENDATIONS FOR EXERCISING WITH TYPE 1 OR TYPE 2 DIABETES   Check your blood glucose before exercising. If blood glucose levels are greater than 240 mg/dL, check for urine ketones. Do not exercise if ketones are present.  Avoid injecting insulin into areas of the body that are going to be exercised. For example, avoid injecting insulin into:  The arms when playing tennis.  The legs when jogging.  Keep a record of:  Food intake before and after you exercise.  Expected peak times of insulin action.  Blood glucose levels before and after you exercise.  The type and amount of exercise you have done.  Review your records with your health care provider. Your health care provider will help you to develop guidelines for adjusting food intake and insulin amounts before and after exercising.  If you take insulin or oral hypoglycemic agents, watch for signs and symptoms of hypoglycemia. They include:  Dizziness.  Shaking.  Sweating.  Chills.  Confusion.  Drink plenty of water while you exercise to prevent dehydration or heat stroke. Body water is lost during exercise and must be replaced.  Talk to your health care provider before starting an exercise program to make sure it is safe for you. Remember, almost any  type of activity is better than none.   This information is not intended to replace advice given to you by your health care provider. Make sure you discuss any questions you have with your health care provider.   Document Released: 02/19/2004 Document Revised: 04/15/2015 Document Reviewed: 05/08/2013 Elsevier Interactive Patient Education [employer].

## 2016-09-09 NOTE — Progress Notes (Signed)
Chad Cantu, is a 65 y.o. male  [name]  [name]  DOB - [birth year]  CC: No chief complaint on file.      HPI: Chad Cantu is a 65 y.o. male here today to establish medical care., last seen in clinic 2/17, w/ hx of hld, htn, tob abuse.  Still smokes 1/2 ppd, rare 1-2 alcohol day.  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  Doing well overall, no c/o.  Patient has No headache, No chest pain, No abdominal pain - No Nausea, No new weakness tingling or numbness, No Cough - SOB.  Here for physical today.   Review of Systems: Per HPI, o/w all systems reviewed and negative  No Known Allergies Past Medical History:  Diagnosis Date  . Hyperlipidemia   . Hypertension    Current Outpatient Prescriptions on File Prior to Visit  Medication Sig Dispense Refill  . amoxicillin (AMOXIL) 500 MG capsule Take 1 capsule (500 mg total) by mouth 3 (three) times daily. (Patient not taking: Reported on 04/08/2015) 30 capsule 0  . chlordiazePOXIDE (LIBRIUM) 25 MG capsule 50mg  PO TID x 1D, then 25-50mg  PO BID X 1D, then 25-50mg  PO QD X 1D 10 capsule 0  . cyclobenzaprine (FLEXERIL) 5 MG tablet Take 1 tablet (5 mg total) by mouth 3 (three) times daily as needed for muscle spasms. (Patient not taking: Reported on 04/08/2015) 30 tablet 1  . meloxicam (MOBIC) 15 MG tablet Take 1 tablet (15 mg total) by mouth daily. 30 tablet 0  . omega-3 acid ethyl esters (LOVAZA) 1 g capsule TAKE 2 CAPSULES BY MOUTH 2 TIMES DAILY. 120 capsule 0  . traMADol (ULTRAM) 50 MG tablet TAKE 1 TABLET BY MOUTH EVERY 12 HOURS AS NEEDED FOR SEVERE PAIN 60 tablet 1  . triamcinolone cream (KENALOG) 0.1 % Apply 1 application topically 2 (two) times daily. (Patient not taking: Reported on 01/31/2016) 30 g 0   No current facility-administered medications on file prior to visit.    Family History  Problem Relation Age of Onset  . Diabetes Mother    Social History   Social History  . Marital status:  Married    Spouse name: N/A  . Number of children: N/A  . Years of education: N/A   Occupational History  . Not on file.   Social History Main Topics  . Smoking status: Heavy Tobacco Smoker    Packs/day: 1.00  . Smokeless tobacco: Not on file  . Alcohol use 1.5 oz/week    3 Standard drinks or equivalent per week  . Drug use:      Comment: THC and Crack last usage 4-5 years ago  . Sexual activity: Yes   Other Topics Concern  . Not on file   Social History Narrative  . No narrative on file    Objective:   Vitals:   09/09/16 1032  BP: 135/82  Pulse: 84  Resp: 16  Temp: 98.5 F (36.9 C)    Filed Weights   09/09/16 1032  Weight: 175 lb 6.4 oz (79.6 kg)    BP Readings from Last 3 Encounters:  09/09/16 135/82  01/31/16 146/95  01/16/16 (!) 138/92    Physical Exam: Constitutional: Patient appears well-developed and well-nourished. No distress. AAOx3, dressed in clinic gown. HENT: Normocephalic, atraumatic, External right and left ear normal. Oropharynx is clear and moist. bilat TMs clear. Eyes: Conjunctivae and EOM are normal. PERRL, no scleral icterus. Neck: Normal ROM. Neck supple. No JVD.  CVS:  RRR, S1/S2 +, no murmurs, no gallops, no carotid bruit.  Pulmonary: Effort and breath sounds normal, no stridor, rhonchi, wheezes, rales.  Abdominal: Soft. BS +, no distension, tenderness, rebound or guarding.  Musculoskeletal: Normal range of motion. No edema and no tenderness.  Rectal exam; no external/internal hemorrhoids, prostate nrml caliber and consistency, nttp. Good rectal tone. LE: bilat/ no c/c/e, pulses 2+ bilateral. Neuro: Alert. muscle tone coordination wnl. No cranial nerve deficit grossly. Skin: Skin is warm and dry. No rash noted. Not diaphoretic. No erythema. No pallor. Psychiatric: Normal mood and affect. Behavior, judgment, thought content normal.  Lab Results  Component Value Date   WBC 8.0 01/31/2016   HGB 14.6 01/31/2016   HCT 43.4 01/31/2016     MCV 90.6 01/31/2016   PLT 260 01/31/2016   Lab Results  Component Value Date   CREATININE 0.81 01/31/2016   BUN 9 01/31/2016   NA 142 01/31/2016   K 4.7 01/31/2016   CL 109 01/31/2016   CO2 23 01/31/2016    Lab Results  Component Value Date   HGBA1C 6.1 01/08/2016   Lipid Panel     Component Value Date/Time   CHOL 234 (H) 01/16/2016 1052   TRIG 476 (H) 01/16/2016 1052   HDL 36 (L) 01/16/2016 1052   CHOLHDL 6.5 (H) 01/16/2016 1052   VLDL NOT CALC 01/16/2016 1052   LDLCALC NOT CALC 01/16/2016 1052       Depression screen PHQ 2/9 04/29/2014  Decreased Interest 0  Down, Depressed, Hopeless 0  PHQ - 2 Score 0   guiac negative.  Assessment and plan:   1. Essential hypertension - controlled, encouraged continued low salt/DASH diet - lisinopril-hydrochlorothiazide (PRINZIDE,ZESTORETIC) 10-12.5 MG tablet; Take 1 tablet by mouth daily. Must have office visit for more refills  Dispense: 30 tablet; Refill: 5  2. Smoking 1/2 ppd, complete cessation recd Trial nicoderm 62mcg patches, don't smoke w/ it.  3. HLD (hyperlipidemia) Currently not fasting, renewed atorvastatin 20 qhs  4. Encounter for screening for HIV - HIV antibody (with reflex)  5. Need for hepatitis C screening test - Hepatitis C antibody  6. Prediabetes H/o,diw pt low carb diet, recd metformin, pt amendable to trying metformin 500 qday. Dw pt to watch for gi sx/indigestion/fullness/lose stools, most resolve after 1-2 wks, if persist, call me and can switch to long acting. - HgB A1c 5.9 (better than 6.1) - metformin 500 qday  7. Colon cancer screening Per pt, had colonoscopy >10 years ago, out of town, noted polyps, did not recall if they told when needed repeat - Ambulatory referral to Gastroenterology - Fecal Occult Blood, Guaiac  8. Flu vaccine need - Flu Vaccine QUAD 36+ mos PF IM (Fluarix & Fluzone Quad PF) - tpap today - next time///pneumococcal 23v  Return in about 3 months (around  12/09/2016).  The patient was given clear instructions to go to ER or return to medical center if symptoms don't improve, worsen or new problems develop. The patient verbalized understanding. The patient was told to call to get lab results if they haven't heard anything in the next week.    This note has been created with Surveyor, quantity. Any transcriptional errors are unintentional.   Maren Reamer, MD, [place], [place]   09/09/2016, 11:48 AM

## 2016-09-10 LAB — HEPATITIS C ANTIBODY: HCV Ab: NEGATIVE

## 2016-09-10 LAB — HIV ANTIBODY (ROUTINE TESTING W REFLEX): HIV: NONREACTIVE

## 2016-09-17 ENCOUNTER — Telehealth: Payer: Self-pay

## 2016-09-17 NOTE — Telephone Encounter (Signed)
Contacted pt to go over lab results spoke with pt wife and she is aware that labs were normal

## 2016-10-25 MED FILL — metFORMIN HCL 500 MG TABS: 500 | 30 days supply | Qty: 60 | Fill #1

## 2016-10-25 MED FILL — LISINOPRIL-HCTZ 10-12.5 MG: 10-12.5 | 30 days supply | Qty: 30 | Fill #4

## 2016-11-25 MED FILL — LISINOPRIL-HCTZ 10-12.5 MG: 10-12.5 | 30 days supply | Qty: 30 | Fill #5

## 2016-12-28 MED FILL — ATORVASTATIN 20 MG TABLET: 20 | 90 days supply | Qty: 90 | Fill #1

## 2016-12-28 MED FILL — LISINOPRIL-HCTZ 10-12.5 MG: 10-12.5 | 30 days supply | Qty: 30 | Fill #1

## 2016-12-28 MED FILL — metFORMIN HCL 500 MG TABS: 500 | 30 days supply | Qty: 60 | Fill #2

## 2017-02-02 MED FILL — LISINOPRIL-HCTZ 10-12.5 MG: 10-12.5 | 30 days supply | Qty: 30 | Fill #2

## 2017-02-16 ENCOUNTER — Encounter: Payer: Self-pay | Admitting: Internal Medicine

## 2017-03-14 MED FILL — LISINOPRIL-HCTZ 10-12.5 MG: 10-12.5 | 30 days supply | Qty: 30 | Fill #3

## 2017-03-14 MED FILL — metFORMIN HCL 500 MG TABS: 500 | 30 days supply | Qty: 60 | Fill #3

## 2017-05-01 ENCOUNTER — Encounter: Payer: Self-pay | Admitting: Internal Medicine

## 2017-05-01 DIAGNOSIS — E119 Type 2 diabetes mellitus without complications: Secondary | ICD-10-CM | POA: Insufficient documentation

## 2017-05-01 DIAGNOSIS — R7303 Prediabetes: Secondary | ICD-10-CM | POA: Insufficient documentation

## 2017-05-01 DIAGNOSIS — IMO0002 Reserved for concepts with insufficient information to code with codable children: Secondary | ICD-10-CM | POA: Insufficient documentation

## 2017-05-02 ENCOUNTER — Ambulatory Visit: Payer: BC Managed Care – PPO | Attending: Internal Medicine | Admitting: Internal Medicine

## 2017-05-02 ENCOUNTER — Encounter: Payer: Self-pay | Admitting: Internal Medicine

## 2017-05-02 VITALS — BP 142/95 | HR 88 | Temp 99.1°F | Resp 12 | Ht 66.0 in | Wt 172.6 lb

## 2017-05-02 DIAGNOSIS — R21 Rash and other nonspecific skin eruption: Secondary | ICD-10-CM

## 2017-05-02 DIAGNOSIS — R413 Other amnesia: Secondary | ICD-10-CM | POA: Diagnosis not present

## 2017-05-02 DIAGNOSIS — I10 Essential (primary) hypertension: Secondary | ICD-10-CM

## 2017-05-02 DIAGNOSIS — F17210 Nicotine dependence, cigarettes, uncomplicated: Secondary | ICD-10-CM | POA: Diagnosis not present

## 2017-05-02 DIAGNOSIS — R7303 Prediabetes: Secondary | ICD-10-CM | POA: Diagnosis not present

## 2017-05-02 DIAGNOSIS — N3281 Overactive bladder: Secondary | ICD-10-CM

## 2017-05-02 DIAGNOSIS — F17200 Nicotine dependence, unspecified, uncomplicated: Secondary | ICD-10-CM | POA: Diagnosis not present

## 2017-05-02 DIAGNOSIS — Z1211 Encounter for screening for malignant neoplasm of colon: Secondary | ICD-10-CM | POA: Diagnosis not present

## 2017-05-02 DIAGNOSIS — E785 Hyperlipidemia, unspecified: Secondary | ICD-10-CM

## 2017-05-02 LAB — POCT GLYCOSYLATED HEMOGLOBIN (HGB A1C): HEMOGLOBIN A1C: 5.8

## 2017-05-02 LAB — GLUCOSE, POCT (MANUAL RESULT ENTRY): POC Glucose: 98 mg/dl (ref 70–99)

## 2017-05-02 MED ORDER — METFORMIN HCL 500 MG PO TABS: 500.0000 mg | ORAL_TABLET | Freq: Every day | ORAL | 5 refills | Status: DC

## 2017-05-02 MED ORDER — SOLIFENACIN SUCCINATE 5 MG PO TABS: 5.0000 mg | ORAL_TABLET | Freq: Every day | ORAL | 6 refills | Status: DC

## 2017-05-02 MED ORDER — LISINOPRIL-HYDROCHLOROTHIAZIDE 10-12.5 MG PO TABS: 1.0000 | ORAL_TABLET | Freq: Every day | ORAL | 5 refills | Status: DC

## 2017-05-02 MED ORDER — ATORVASTATIN CALCIUM 20 MG PO TABS: 20.0000 mg | ORAL_TABLET | Freq: Every day | ORAL | 3 refills | Status: DC

## 2017-05-02 MED ORDER — FLUOCINONIDE 0.05 % EX CREA: 1.0000 "application " | TOPICAL_CREAM | Freq: Two times a day (BID) | CUTANEOUS | 0 refills | Status: DC

## 2017-05-02 MED FILL — metFORMIN HCL 500 MG TABS: 500 | 30 days supply | Qty: 30 | Fill #0

## 2017-05-02 MED FILL — ATORVASTATIN 20 MG TABLET: 20 | 90 days supply | Qty: 90 | Fill #0

## 2017-05-02 MED FILL — LISINOPRIL-HCTZ 10-12.5 MG: 10-12.5 | 30 days supply | Qty: 30 | Fill #0

## 2017-05-02 MED FILL — VESIcare 5 MG TABS: 5 | 30 days supply | Qty: 30 | Fill #0

## 2017-05-02 NOTE — Progress Notes (Signed)
Patient ID: Chad Cantu, male    DOB: 15-Mar-1951  MRN: [id number]  CC: No chief complaint on file.   Subjective: Chad Cantu is a 66 y.o. male who presents for routine f/u.  Last seen 08/2016 .  Wife, Chad Cantu, is with him.  Hx of prediabetes, HTN, tobacco, hyperlipidemia, ETOH use disorder admitted for detox 07/2016 at [facility] His concerns today include:  1.  C/o problem holding his urine x a few mth -when he gets urge, has to go to avoid incontinence  -no dysuria, fever, no back injury -no poly-nocturia.  2.  Itchy Rash on leg x 1 yr.  Started after he cut grass one day -increasing in size -given cream in past for same which he recalls did not do much -no using anything since then.   3.  HTN: takes med but forgot to take today.  -using salt on watermelon and other fruits -no CP/SOB/LE edema/HA/dizziness  4.  Wife concern that he has increase forgetfulness x 6 mths. Pt endorses same. -pt forgets conversations that were discussed 5 mins ago.  Also repeatly asks same questions that she answered for him a few mins previously. -not sure of any family hx of dementia -sleeping about 4-7 hrs   5. Tobacco dep: 1/2 pk a day.  Smoked since age 64. -quit for 1 mth in past.  -not ready to quit  6.  Pre-DM. - he had stopped taking the Metformin.  Denies any S.E from it   7.  Never had colonoscopy ordered on last visit.  Last colonoscopy was about 10-11 yrs ago.  He thinks they did remove polyp   Patient Active Problem List   Diagnosis Date Noted  . Memory difficulty 05/02/2017  . Rash, skin 05/02/2017  . Overactive bladder 05/02/2017  . Prediabetes 05/01/2017  . Alcohol use disorder ([place]) 05/01/2017  . HLD (hyperlipidemia) 04/09/2015  . Essential hypertension 04/08/2015  . Smoking 04/08/2015     No current outpatient prescriptions on file prior to visit.   No current facility-administered medications on file prior to visit.     No Known Allergies  Social History    Social History  . Marital status: Married    Spouse name: N/A  . Number of children: N/A  . Years of education: N/A   Occupational History  . Not on file.   Social History Main Topics  . Smoking status: Heavy Tobacco Smoker    Packs/day: 1.00    Types: Cigarettes  . Smokeless tobacco: Never Used  . Alcohol use 1.5 oz/week    3 Standard drinks or equivalent per week  . Drug use: Yes     Comment: THC and Crack last usage 4-5 years ago  . Sexual activity: Yes   Other Topics Concern  . Not on file   Social History Narrative  . No narrative on file    Family History  Problem Relation Age of Onset  . Diabetes Mother     Past Surgical History:  Procedure Laterality Date  . LAPAROSCOPIC GASTROTOMY W/ REPAIR OF ULCER      ROS: Review of Systems  Constitutional: Negative for activity change, appetite change, fatigue and fever.  Eyes: Negative for visual disturbance.  Respiratory: Negative for cough, chest tightness and shortness of breath.   Cardiovascular: Negative for chest pain and leg swelling.  Gastrointestinal: Negative for abdominal pain and constipation.  Skin:       As above    PHYSICAL EXAM: BP (!) 142/95 (BP  Location: Left Arm, Patient Position: Sitting, Cuff Size: Normal)   Pulse 88   Temp 99.1 F (37.3 C) (Oral)   Resp 12   Ht 5\' 6"  (1.676 m)   Wt 172 lb 9.6 oz (78.3 kg)   SpO2 97%   BMI 27.86 kg/m   Physical Exam  Constitutional: He is oriented to person, place, and time.  Well nourished middle age to older AAM in NAD  HENT:  Mouth/Throat: Oropharynx is clear and moist. No oropharyngeal exudate.  Eyes: EOM are normal. Pupils are equal, round, and reactive to light.  Neck: No thyromegaly present.  Cardiovascular: Normal rate and regular rhythm.   No murmur heard. Pulmonary/Chest: Effort normal and breath sounds normal.  Musculoskeletal: Normal range of motion.  Lymphadenopathy:    He has no cervical adenopathy.  Neurological: He is  oriented to person, place, and time. No cranial nerve deficit. Coordination normal.  Skin:  13-15 cm x6 cm, macular, hyperpigmented area on RT lateral lower leg  Psychiatric: He has a normal mood and affect.  MiniCog: 3/5 - no problems recalling 3 objects after 5 mins.  Drew clock and set right time but did not put in all numbers  Results for orders placed or performed in visit on 05/02/17  Glucose (CBG)  Result Value Ref Range   POC Glucose 98 70 - 99 mg/dl  HgB A1c  Result Value Ref Range   Hemoglobin A1C 5.8      ASSESSMENT AND PLAN: 1. Overactive bladder -discussed dx. -recommended and discussed bladder training exercises including time voiding and trying to hold urine when he gets urge to urinate then walking to rest room - solifenacin (VESICARE) 5 MG tablet; Take 1 tablet (5 mg total) by mouth daily.  Dispense: 30 tablet; Refill: 6 - PSA - Urinalysis  2. Rash, skin - Ambulatory referral to Dermatology - fluocinonide cream (LIDEX) 0.05 %; Apply 1 application topically 2 (two) times daily.  Dispense: 30 g; Refill: 0  3. Essential hypertension -not at goal.  No change made in med as he did not take med today.   -DASH discussed and encouraged - lisinopril-hydrochlorothiazide (PRINZIDE,ZESTORETIC) 10-12.5 MG tablet; Take 1 tablet by mouth daily. Must have office visit for more refills  Dispense: 30 tablet; Refill: 5 - CBC - Comprehensive metabolic panel  4. Tobacco dependence -advised to quit.  Discussed health risk associated with smoking and cessation methods including NRT, Chantix, Buproprion. 5 mins spent discussing. Pt not ready to quit.  5. Memory difficulty - Vitamin B12 - TSH - Ambulatory referral to Neurology - memory clinic  6. Prediabetes -discussed importance of healthy eating habits and regular exercise 9at least 15-mins/wk of aerobic exercise) - Glucose (CBG) - HgB A1c - metFORMIN (GLUCOPHAGE) 500 MG tablet; Take 1 tablet (500 mg total) by mouth daily  with breakfast.  Dispense: 30 tablet; Refill: 5  7. Colon cancer screening - Ambulatory referral to Gastroenterology  8. Hyperlipidemia, unspecified hyperlipidemia type - atorvastatin (LIPITOR) 20 MG tablet; Take 1 tablet (20 mg total) by mouth daily.  Dispense: 90 tablet; Refill: 3 - Lipid panel  Patient was given the opportunity to ask questions.  Patient verbalized understanding of the plan and was able to repeat key elements of the plan.   Orders Placed This Encounter  Procedures  . CBC  . Comprehensive metabolic panel  . Lipid panel  . PSA  . Vitamin B12  . TSH  . Urinalysis  . Ambulatory referral to Dermatology  . Ambulatory referral  to Gastroenterology  . Ambulatory referral to Neurology  . Glucose (CBG)  . HgB A1c     Requested Prescriptions   Signed Prescriptions Disp Refills  . atorvastatin (LIPITOR) 20 MG tablet 90 tablet 3    Sig: Take 1 tablet (20 mg total) by mouth daily.  Marland Kitchen lisinopril-hydrochlorothiazide (PRINZIDE,ZESTORETIC) 10-12.5 MG tablet 30 tablet 5    Sig: Take 1 tablet by mouth daily. Must have office visit for more refills  . solifenacin (VESICARE) 5 MG tablet 30 tablet 6    Sig: Take 1 tablet (5 mg total) by mouth daily.  . fluocinonide cream (LIDEX) 0.05 % 30 g 0    Sig: Apply 1 application topically 2 (two) times daily.  . metFORMIN (GLUCOPHAGE) 500 MG tablet 30 tablet 5    Sig: Take 1 tablet (500 mg total) by mouth daily with breakfast.    Return in about 3 months (around 08/02/2017).  Karle Plumber, MD, FACP

## 2017-05-02 NOTE — Patient Instructions (Addendum)
Nurse only BP check in 1 month.  Try to limit salt in the food. Try the bladder exercises as discussed today.   Overactive Bladder, Adult Overactive bladder is a group of urinary symptoms. With overactive bladder, you may suddenly feel the need to pass urine (urinate) right away. After feeling this sudden urge, you might also leak urine if you cannot get to the bathroom fast enough (urinary incontinence). These symptoms might interfere with your daily work or social activities. Overactive bladder symptoms may also wake you up at night. Overactive bladder affects the nerve signals between your bladder and your brain. Your bladder may get the signal to empty before it is full. Very sensitive muscles can also make your bladder squeeze too soon. What are the causes? Many things can cause an overactive bladder. Possible causes include:  Urinary tract infection.  Infection of nearby tissues, such as the prostate.  Prostate enlargement.  Being pregnant with twins or more (multiples).  Surgery on the uterus or urethra.  Bladder stones, inflammation, or tumors.  Drinking too much caffeine or alcohol.  Certain medicines, especially those that you take to help your body get rid of extra fluid (diuretics) by increasing urine production.  Muscle or nerve weakness, especially from:  A spinal cord injury.  Stroke.  Multiple sclerosis.  Parkinson disease.  Diabetes. This can cause a high urine volume that fills the bladder so quickly that the normal urge to urinate is triggered very strongly.  Constipation. A buildup of too much stool can put pressure on your bladder. What increases the risk? You may be at greater risk for overactive bladder if you:  Are an older adult.  Smoke.  Are going through menopause.  Have prostate problems.  Have a neurological disease, such as stroke, dementia, Parkinson disease, or multiple sclerosis (MS).  Eat or drink things that irritate the  bladder. These include alcohol, spicy food, and caffeine.  Are overweight or obese. What are the signs or symptoms? The signs and symptoms of an overactive bladder include:  Sudden, strong urges to urinate.  Leaking urine.  Urinating eight or more times per day.  Waking up to urinate two or more times per night. How is this diagnosed? Your health care provider may suspect overactive bladder based on your symptoms. The health care provider will do a physical exam and take your medical history. Blood or urine tests may also be done. For example, you might need to have a bladder function test to check how well you can hold your urine. You might also need to see a health care provider who specializes in the urinary tract (urologist). How is this treated? Treatment for overactive bladder depends on the cause of your condition and whether it is mild or severe. Certain treatments can be done in your health care provider's office or clinic. You can also make lifestyle changes at home. Options include: Behavioral Treatments   Biofeedback. A specialist uses sensors to help you become aware of your body's signals.  Keeping a daily log of when you need to urinate and what happens after the urge. This may help you manage your condition.  Bladder training. This helps you learn to control the urge to urinate by following a schedule that directs you to urinate at regular intervals (timed voiding). At first, you might have to wait a few minutes after feeling the urge. In time, you should be able to schedule bathroom visits an hour or more apart.  Kegel exercises. These are  exercises to strengthen the pelvic floor muscles, which support the bladder. Toning these muscles can help you control urination, even if your bladder muscles are overactive. A specialist will teach you how to do these exercises correctly. They require daily practice.  Weight loss. If you are obese or overweight, losing weight might  relieve your symptoms of overactive bladder. Talk to your health care provider about losing weight and whether there is a specific program or method that would work best for you.  Diet change. This might help if constipation is making your overactive bladder worse. Your health care provider or a dietitian can explain ways to change what you eat to ease constipation. You might also need to consume less alcohol and caffeine or drink other fluids at different times of the day.  Stopping smoking.  Wearing pads to absorb leakage while you wait for other treatments to take effect. Physical Treatments   Electrical stimulation. Electrodes send gentle pulses of electricity to strengthen the nerves or muscles that help to control the bladder. Sometimes, the electrodes are placed outside of the body. In other cases, they might be placed inside the body (implanted). This treatment can take several months to have an effect.  Supportive devices. Women may need a plastic device that fits into the vagina and supports the bladder (pessary). Medicines  Several medicines can help treat overactive bladder and are usually used along with other treatments. Some are injected into the muscles involved in urination. Others come in pill form. Your health care provider may prescribe:  Antispasmodics. These medicines block the signals that the nerves send to the bladder. This keeps the bladder from releasing urine at the wrong time.  Tricyclic antidepressants. These types of antidepressants also relax bladder muscles. Surgery   You may have a device implanted to help manage the nerve signals that indicate when you need to urinate.  You may have surgery to implant electrodes for electrical stimulation.  Sometimes, very severe cases of overactive bladder require surgery to change the shape of the bladder. Follow these instructions at home:  Take medicines only as directed by your health care provider.  Use any  implants or a pessary as directed by your health care provider.  Make any diet or lifestyle changes that are recommended by your health care provider. These might include:  Drinking less fluid or drinking at different times of the day. If you need to urinate often during the night, you may need to stop drinking fluids early in the evening.  Cutting down on caffeine or alcohol. Both can make an overactive bladder worse. Caffeine is found in coffee, tea, and sodas.  Doing Kegel exercises to strengthen muscles.  Losing weight if you need to.  Eating a healthy and balanced diet to prevent constipation.  Keep a journal or log to track how much and when you drink and also when you feel the need to urinate. This will help your health care provider to monitor your condition. Contact a health care provider if:  Your symptoms do not get better after treatment.  Your pain and discomfort are getting worse.  You have more frequent urges to urinate.  You have a fever. Get help right away if: You are not able to control your bladder at all. This information is not intended to replace advice given to you by your health care provider. Make sure you discuss any questions you have with your health care provider. Document Released: 09/25/2009 Document Revised: 05/06/2016 Document Reviewed: 04/24/2014  Elsevier Interactive Patient Education  2017 [employer]  

## 2017-05-03 ENCOUNTER — Telehealth: Payer: Self-pay

## 2017-05-03 ENCOUNTER — Encounter: Payer: Self-pay | Admitting: Internal Medicine

## 2017-05-03 ENCOUNTER — Other Ambulatory Visit: Payer: Self-pay | Admitting: Internal Medicine

## 2017-05-03 DIAGNOSIS — R7989 Other specified abnormal findings of blood chemistry: Secondary | ICD-10-CM

## 2017-05-03 DIAGNOSIS — R945 Abnormal results of liver function studies: Secondary | ICD-10-CM

## 2017-05-03 LAB — COMPREHENSIVE METABOLIC PANEL
ALK PHOS: 69 IU/L (ref 39–117)
ALT: 57 IU/L — ABNORMAL HIGH (ref 0–44)
AST: 49 IU/L — AB (ref 0–40)
Albumin/Globulin Ratio: 1.3 (ref 1.2–2.2)
Albumin: 4.5 g/dL (ref 3.6–4.8)
BUN/Creatinine Ratio: 12 (ref 10–24)
BUN: 12 mg/dL (ref 8–27)
Bilirubin Total: 0.3 mg/dL (ref 0.0–1.2)
CALCIUM: 10.1 mg/dL (ref 8.6–10.2)
CO2: 26 mmol/L (ref 18–29)
CREATININE: 1.03 mg/dL (ref 0.76–1.27)
Chloride: 99 mmol/L (ref 96–106)
GFR calc Af Amer: 89 mL/min/{1.73_m2} (ref >59)
GFR, EST NON AFRICAN AMERICAN: 77 mL/min/{1.73_m2} (ref >59)
GLOBULIN, TOTAL: 3.5 g/dL (ref 1.5–4.5)
GLUCOSE: 81 mg/dL (ref 65–99)
Potassium: 4.5 mmol/L (ref 3.5–5.2)
Sodium: 139 mmol/L (ref 134–144)
Total Protein: 8 g/dL (ref 6.0–8.5)

## 2017-05-03 LAB — VITAMIN B12: Vitamin B-12: 417 pg/mL (ref 232–1245)

## 2017-05-03 LAB — CBC
HEMATOCRIT: 43.8 % (ref 37.5–51.0)
HEMOGLOBIN: 15.2 g/dL (ref 13.0–17.7)
MCH: 31.5 pg (ref 26.6–33.0)
MCHC: 34.7 g/dL (ref 31.5–35.7)
MCV: 91 fL (ref 79–97)
Platelets: 266 10*3/uL (ref 150–379)
RBC: 4.82 x10E6/uL (ref 4.14–5.80)
RDW: 14.9 % (ref 12.3–15.4)
WBC: 9.4 10*3/uL (ref 3.4–10.8)

## 2017-05-03 LAB — URINALYSIS
BILIRUBIN UA: NEGATIVE
Glucose, UA: NEGATIVE
Ketones, UA: NEGATIVE
Leukocytes, UA: NEGATIVE
Nitrite, UA: NEGATIVE
PH UA: 5.5 (ref 5.0–7.5)
RBC, UA: NEGATIVE
Specific Gravity, UA: 1.017 (ref 1.005–1.030)
UUROB: 0.2 mg/dL (ref 0.2–1.0)

## 2017-05-03 LAB — LIPID PANEL
CHOL/HDL RATIO: 4.1 ratio (ref 0.0–5.0)
Cholesterol, Total: 202 mg/dL — ABNORMAL HIGH (ref 100–199)
HDL: 49 mg/dL (ref >39)
Triglycerides: 490 mg/dL — ABNORMAL HIGH (ref 0–149)

## 2017-05-03 LAB — TSH: TSH: 1.81 u[IU]/mL (ref 0.450–4.500)

## 2017-05-03 LAB — PSA: Prostate Specific Ag, Serum: 3.4 ng/mL (ref 0.0–4.0)

## 2017-05-03 NOTE — Telephone Encounter (Signed)
-----   Message from [name], MD sent at 05/03/2017  2:48 PM EDT ----- Let patient know that his blood count is normal, meaning no anemia.  Kidney function is normal. Liver enzymes are mildly elevated; this may be due to Lipitor or overindulgence with alcoholic beverages.  Advised to cut back on alcohol use if he drinks. Continue the Lipitor as cholesterol level was elevated. Please confirm whether or not he had been taking the Lipitor consistently. Return to the lab in 1 month for repeat of liver enzyme tests. I have put this in as a future order.

## 2017-05-03 NOTE — Telephone Encounter (Signed)
Clld pt - spoke to wife - No DPR - adsvd to have pt call for lab results. Spouse advsd pt was in route to our pharmacy - advsd if I am not with a pt, I would be able to talk to him. Spouse stated she would tell him.

## 2017-05-03 NOTE — Progress Notes (Signed)
Labs reviewed.  Lab message sent to CMA to contact pt.  I would like to confirm that pt is taking the Lipitor as cholesterol is elevated.  Also, mild elevation in LFTs likely due to Lipitor or ETOH. Would like to repeat LFTs in 1 mth, order entered..  Continue Lipitor for now.

## 2017-05-03 NOTE — Telephone Encounter (Signed)
Pt here in the office - went over lab results with him. Pt stated he understood.

## 2017-05-03 NOTE — Telephone Encounter (Signed)
Advsd pt re lab repeat in 1 mth. Pt stated he would.

## 2017-05-04 ENCOUNTER — Other Ambulatory Visit: Payer: Self-pay | Admitting: Family Medicine

## 2017-05-04 DIAGNOSIS — R21 Rash and other nonspecific skin eruption: Secondary | ICD-10-CM

## 2017-05-05 ENCOUNTER — Other Ambulatory Visit: Payer: Self-pay | Admitting: Internal Medicine

## 2017-05-05 DIAGNOSIS — R21 Rash and other nonspecific skin eruption: Secondary | ICD-10-CM

## 2017-05-05 MED ORDER — FLUOCINONIDE 0.05 % EX CREA: 1.0000 "application " | TOPICAL_CREAM | Freq: Two times a day (BID) | CUTANEOUS | 0 refills | Status: DC

## 2017-06-02 ENCOUNTER — Ambulatory Visit: Payer: BC Managed Care – PPO | Attending: Internal Medicine

## 2017-06-02 DIAGNOSIS — R945 Abnormal results of liver function studies: Secondary | ICD-10-CM

## 2017-06-02 DIAGNOSIS — R7989 Other specified abnormal findings of blood chemistry: Secondary | ICD-10-CM | POA: Insufficient documentation

## 2017-06-02 NOTE — Progress Notes (Signed)
Patient here for lab visit only 

## 2017-06-03 LAB — HEPATIC FUNCTION PANEL
ALBUMIN: 4.4 g/dL (ref 3.6–4.8)
ALT: 37 IU/L (ref 0–44)
AST: 38 IU/L (ref 0–40)
Alkaline Phosphatase: 65 IU/L (ref 39–117)
BILIRUBIN TOTAL: 0.3 mg/dL (ref 0.0–1.2)
BILIRUBIN, DIRECT: 0.1 mg/dL (ref 0.00–0.40)
Total Protein: 7.5 g/dL (ref 6.0–8.5)

## 2017-06-07 ENCOUNTER — Telehealth: Payer: Self-pay

## 2017-06-07 NOTE — Telephone Encounter (Signed)
Contacted

## 2017-06-07 NOTE — Telephone Encounter (Signed)
Contacted pt to go over lab results pt is aware of results and doesn't have any questions or concerns 

## 2017-07-01 MED FILL — LISINOPRIL-HCTZ 10-12.5 MG: 10-12.5 | 30 days supply | Qty: 30 | Fill #1

## 2017-07-07 ENCOUNTER — Telehealth: Payer: Self-pay | Admitting: *Deleted

## 2017-07-07 ENCOUNTER — Ambulatory Visit: Payer: Self-pay | Admitting: Neurology

## 2017-07-07 NOTE — Telephone Encounter (Signed)
No showed new patient appointment. 

## 2017-07-11 ENCOUNTER — Encounter: Payer: Self-pay | Admitting: Neurology

## 2017-07-22 MED FILL — metFORMIN HCL 500 MG TABS: 500 | 30 days supply | Qty: 30 | Fill #1

## 2017-08-05 ENCOUNTER — Ambulatory Visit: Payer: Self-pay | Admitting: Internal Medicine

## 2017-08-23 MED FILL — LISINOPRIL-HCTZ 10-12.5 MG: 10-12.5 | 30 days supply | Qty: 30 | Fill #2

## 2017-08-24 MED FILL — ATORVASTATIN 20 MG TABLET: 20 | 90 days supply | Qty: 90 | Fill #1

## 2017-10-07 MED FILL — metFORMIN HCL 500 MG TABS: 500 | 30 days supply | Qty: 30 | Fill #2

## 2017-11-15 MED FILL — LISINOPRIL-HCTZ 10-12.5 MG: 10-12.5 | 30 days supply | Qty: 30 | Fill #3

## 2017-11-15 MED FILL — metFORMIN HCL 500 MG TABS: 500 | 30 days supply | Qty: 30 | Fill #3

## 2017-12-28 MED FILL — LISINOPRIL-HCTZ 10-12.5 MG: 10-12.5 | 30 days supply | Qty: 30 | Fill #4

## 2018-01-10 MED FILL — metFORMIN HCL 500 MG TABS: 500 | 30 days supply | Qty: 30 | Fill #4

## 2018-02-10 MED FILL — metFORMIN HCL 500 MG TABS: 500 | 30 days supply | Qty: 30 | Fill #5

## 2018-02-10 MED FILL — ATORVASTATIN 20 MG TABLET: 20 | 90 days supply | Qty: 90 | Fill #2

## 2018-02-10 MED FILL — LISINOPRIL-HCTZ 10-12.5 MG: 10-12.5 | 30 days supply | Qty: 30 | Fill #5

## 2018-03-29 ENCOUNTER — Other Ambulatory Visit: Payer: Self-pay | Admitting: Internal Medicine

## 2018-03-29 DIAGNOSIS — R7303 Prediabetes: Secondary | ICD-10-CM

## 2018-03-29 DIAGNOSIS — I10 Essential (primary) hypertension: Secondary | ICD-10-CM

## 2018-04-05 ENCOUNTER — Other Ambulatory Visit: Payer: Self-pay | Admitting: Internal Medicine

## 2018-04-05 DIAGNOSIS — I10 Essential (primary) hypertension: Secondary | ICD-10-CM

## 2018-04-05 DIAGNOSIS — R7303 Prediabetes: Secondary | ICD-10-CM

## 2018-04-05 MED FILL — metFORMIN HCL 500 MG TABS: 500 | 30 days supply | Qty: 30 | Fill #0

## 2018-04-05 MED FILL — LISINOPRIL-HCTZ 10-12.5 MG: 10-12.5 | 30 days supply | Qty: 30 | Fill #0

## 2018-06-14 ENCOUNTER — Other Ambulatory Visit: Payer: Self-pay | Admitting: Internal Medicine

## 2018-06-14 DIAGNOSIS — R7303 Prediabetes: Secondary | ICD-10-CM

## 2018-06-14 DIAGNOSIS — I10 Essential (primary) hypertension: Secondary | ICD-10-CM

## 2018-06-14 MED FILL — metFORMIN HCL 500 MG TABS: 500 | 30 days supply | Qty: 30 | Fill #0

## 2018-06-14 MED FILL — LISINOPRIL-HCTZ 10-12.5 MG: 10-12.5 | 30 days supply | Qty: 30 | Fill #0

## 2018-07-20 ENCOUNTER — Encounter: Payer: Self-pay | Admitting: Internal Medicine

## 2018-08-24 MED FILL — metFORMIN HCL 500 MG TABS: 500 | 30 days supply | Qty: 30 | Fill #1

## 2018-08-24 MED FILL — LISINOPRIL-HCTZ 10-12.5 MG: 10-12.5 | 30 days supply | Qty: 30 | Fill #1

## 2018-09-26 ENCOUNTER — Encounter: Payer: Self-pay | Admitting: Internal Medicine

## 2018-09-26 ENCOUNTER — Ambulatory Visit: Payer: BC Managed Care – PPO | Attending: Internal Medicine | Admitting: Internal Medicine

## 2018-09-26 VITALS — BP 167/93 | HR 77 | Temp 98.3°F | Resp 16 | Ht 67.0 in | Wt 167.8 lb

## 2018-09-26 DIAGNOSIS — N3281 Overactive bladder: Secondary | ICD-10-CM | POA: Insufficient documentation

## 2018-09-26 DIAGNOSIS — Z0000 Encounter for general adult medical examination without abnormal findings: Secondary | ICD-10-CM | POA: Insufficient documentation

## 2018-09-26 DIAGNOSIS — G5601 Carpal tunnel syndrome, right upper limb: Secondary | ICD-10-CM | POA: Diagnosis not present

## 2018-09-26 DIAGNOSIS — Z23 Encounter for immunization: Secondary | ICD-10-CM | POA: Diagnosis not present

## 2018-09-26 DIAGNOSIS — E785 Hyperlipidemia, unspecified: Secondary | ICD-10-CM | POA: Diagnosis not present

## 2018-09-26 DIAGNOSIS — Z1211 Encounter for screening for malignant neoplasm of colon: Secondary | ICD-10-CM | POA: Insufficient documentation

## 2018-09-26 DIAGNOSIS — M79601 Pain in right arm: Secondary | ICD-10-CM | POA: Diagnosis not present

## 2018-09-26 DIAGNOSIS — R7303 Prediabetes: Secondary | ICD-10-CM | POA: Insufficient documentation

## 2018-09-26 DIAGNOSIS — Z833 Family history of diabetes mellitus: Secondary | ICD-10-CM | POA: Insufficient documentation

## 2018-09-26 DIAGNOSIS — Z79899 Other long term (current) drug therapy: Secondary | ICD-10-CM | POA: Insufficient documentation

## 2018-09-26 DIAGNOSIS — I10 Essential (primary) hypertension: Secondary | ICD-10-CM | POA: Diagnosis not present

## 2018-09-26 DIAGNOSIS — Z7984 Long term (current) use of oral hypoglycemic drugs: Secondary | ICD-10-CM | POA: Insufficient documentation

## 2018-09-26 DIAGNOSIS — Z125 Encounter for screening for malignant neoplasm of prostate: Secondary | ICD-10-CM | POA: Insufficient documentation

## 2018-09-26 DIAGNOSIS — F17210 Nicotine dependence, cigarettes, uncomplicated: Secondary | ICD-10-CM | POA: Insufficient documentation

## 2018-09-26 DIAGNOSIS — F17200 Nicotine dependence, unspecified, uncomplicated: Secondary | ICD-10-CM

## 2018-09-26 DIAGNOSIS — F1010 Alcohol abuse, uncomplicated: Secondary | ICD-10-CM | POA: Insufficient documentation

## 2018-09-26 LAB — GLUCOSE, POCT (MANUAL RESULT ENTRY): POC GLUCOSE: 98 mg/dL (ref 70–99)

## 2018-09-26 LAB — POCT GLYCOSYLATED HEMOGLOBIN (HGB A1C): HbA1c POC (<> result, manual entry): 5.6 % (ref 4.0–5.6)

## 2018-09-26 MED ORDER — ATORVASTATIN CALCIUM 20 MG PO TABS: 20.0000 mg | ORAL_TABLET | Freq: Every day | ORAL | 3 refills | Status: DC

## 2018-09-26 MED ORDER — AMLODIPINE BESYLATE 5 MG PO TABS: 5.0000 mg | ORAL_TABLET | Freq: Every day | ORAL | 3 refills | Status: DC

## 2018-09-26 MED ORDER — METFORMIN HCL 500 MG PO TABS: 500.0000 mg | ORAL_TABLET | Freq: Every day | ORAL | 2 refills | Status: DC

## 2018-09-26 MED ORDER — PNEUMOCOCCAL 13-VAL CONJ VACC IM SUSP: 0.5000 mL | INTRAMUSCULAR | 0 refills | Status: AC

## 2018-09-26 MED ORDER — LISINOPRIL-HYDROCHLOROTHIAZIDE 10-12.5 MG PO TABS: 1.0000 | ORAL_TABLET | Freq: Every day | ORAL | 2 refills | Status: DC

## 2018-09-26 MED ORDER — GABAPENTIN 300 MG PO CAPS: 300.0000 mg | ORAL_CAPSULE | Freq: Every day | ORAL | 3 refills | Status: DC

## 2018-09-26 MED FILL — LISINOPRIL-HCTZ 10-12.5 MG: 10-12.5 | 90 days supply | Qty: 90 | Fill #0

## 2018-09-26 MED FILL — metFORMIN HCL 500 MG TABS: 500 | 30 days supply | Qty: 30 | Fill #0

## 2018-09-26 MED FILL — PREVNAR 13 SUSP: 1 days supply | Qty: 1 | Fill #0

## 2018-09-26 MED FILL — GABAPENTIN 300 MG CAPSULE: 300 | 30 days supply | Qty: 30 | Fill #0

## 2018-09-26 MED FILL — ATORVASTATIN 20 MG TABLET: 20 | 30 days supply | Qty: 30 | Fill #0

## 2018-09-26 MED FILL — AMLODIPINE BESYLATE 5 MG TA: 5 | 30 days supply | Qty: 30 | Fill #0

## 2018-09-26 NOTE — Progress Notes (Addendum)
Patient ID: Chad Cantu, male    DOB: [birth year]  MRN: [id number]  CC: Annual Exam   Subjective: Chad Cantu is a 66 y.o. male who presents for chronic disease management.  Patient last seen in May 2018 His concerns today include:  Hx of prediabetes, HTN, tobacco, hyperlipidemia, ETOH use disorder admitted for detox 07/2016 at [facility] complaint today is pain in the right arm x2 months.  He points to the muscles in the upper arm just above the lateral epicondyles.  Pain is constant.  It sometimes radiates into the forearm and causes numbness and tingling in the second through the fourth fingers.  Worse if he leans on elbow.  He has been taking 2 Aleve's daily for pain.  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  HTN:  Takes his BP med at nights after work.  BP med is lisinopril/HCTZ Limits salt in foods No CP/SOB/LE edema/blurred/HA dizziness  Prediabetes:  Very active at work.  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  Wants the nicotine patches but states that cost is a factor.  "I've been smoking since age 69."  EtOH: Reports that he drinks 1-2 beers occasionally.  Patient Active Problem List   Diagnosis Date Noted  . Memory difficulty 05/02/2017  . Rash, skin 05/02/2017  . Overactive bladder 05/02/2017  . Prediabetes 05/01/2017  . Alcohol use disorder 05/01/2017  . HLD (hyperlipidemia) 04/09/2015  . Essential hypertension 04/08/2015  . Smoking 04/08/2015     No current outpatient medications on file prior to visit.   No current facility-administered medications on file prior to visit.     No Known Allergies  Social History   Socioeconomic History  . Marital status: Married    Spouse name: Not on file  . Number of children: Not on file  . Years of education: Not on file  . Highest education level: Not on file    Occupational History  . Not on file  Social Needs  . Financial resource strain: Not on file  . Food insecurity:    Worry: Not on file    Inability: Not on file  . Transportation needs:    Medical: Not on file    Non-medical: Not on file  Tobacco Use  . Smoking status: Heavy Tobacco Smoker    Packs/day: 1.00    Types: Cigarettes  . Smokeless tobacco: Never Used  Substance and Sexual Activity  . Alcohol use: Yes    Alcohol/week: 3.0 standard drinks    Types: 3 Standard drinks or equivalent per week  . Drug use: Yes    Comment: THC and Crack last usage 4-5 years ago  . Sexual activity: Yes  Lifestyle  . Physical activity:    Days per week: Not on file    Minutes per session: Not on file  . Stress: Not on file  Relationships  . Social connections:    Talks on phone: Not on file    Gets together: Not on file    Attends religious service: Not on file    Active member of club or organization: Not on file    Attends meetings of clubs or organizations: Not on file    Relationship status: Not on file  . Intimate partner violence:    Fear of current or ex partner: Not on  file    Emotionally abused: Not on file    Physically abused: Not on file    Forced sexual activity: Not on file  Other Topics Concern  . Not on file  Social History Narrative  . Not on file    Family History  Problem Relation Age of Onset  . Diabetes Mother     Past Surgical History:  Procedure Laterality Date  . LAPAROSCOPIC GASTROTOMY W/ REPAIR OF ULCER      ROS: Review of Systems  Eyes: Negative for visual disturbance.       Wears glasses  Cardiovascular: Negative for chest pain, palpitations and leg swelling.  Gastrointestinal: Negative for abdominal pain, blood in stool and constipation.  Genitourinary: Negative for difficulty urinating, frequency and hematuria.  Psychiatric/Behavioral: Negative for dysphoric mood. The patient is not nervous/anxious.    PHYSICAL EXAM: BP (!) 167/93 (BP  Location: Right Arm, Cuff Size: Normal) Comment: recheck  Pulse 77   Temp 98.3 F (36.8 C) (Oral)   Resp 16   Ht 5\' 7"  (1.702 m)   Wt 167 lb 12.8 oz (76.1 kg)   SpO2 99%   BMI 26.28 kg/m   Physical Exam  General appearance - alert, well appearing, and in no distress Mental status - normal mood, behavior, speech, dress, motor activity, and thought processes Eyes - pupils equal and reactive, extraocular eye movements intact Nose - normal and patent, no erythema, discharge or polyps Mouth - mucous membranes moist, pharynx normal without lesions Neck - supple, no significant adenopathy Lymphatics -no cervical axillary lymphadenopathy Chest - clear to auscultation, no wheezes, rales or rhonchi, symmetric air entry Heart - normal rate, regular rhythm, normal S1, S2, no murmurs, rubs, clicks or gallops Abdomen - soft, nontender, nondistended, no masses or organomegaly Musculoskeletal -no edema or erythema noted of the right elbow.  Good range of motion at the right elbow.  No point tenderness of the olecranon or epicondyles.  No tenderness on palpation of the bicep and tricep muscles.  Hand grip 5/5 bilaterally.  Tinel's sign positive on the right.  No wasting of intrinsic muscles of the hand Extremities - peripheral pulses normal, no pedal edema, no clubbing or cyanosis Depression screen [facility] 2/9 09/26/2018 05/02/2017 04/29/2014  Decreased Interest 0 0 0  Down, Depressed, Hopeless 0 0 0  PHQ - 2 Score 0 0 0  Altered sleeping - 0 -  Tired, decreased energy - 0 -  Change in appetite - 0 -  Feeling bad or failure about yourself  - 0 -  Trouble concentrating - 0 -  Moving slowly or fidgety/restless - 0 -  Suicidal thoughts - 0 -  PHQ-9 Score - 0 -    Results for orders placed or performed in visit on 09/26/18  POCT glucose (manual entry)  Result Value Ref Range   POC Glucose 98 70 - 99 mg/dl  POCT glycosylated hemoglobin (Hb A1C)  Result Value Ref Range   Hemoglobin A1C     HbA1c POC  (<> result, manual entry) 5.6 4.0 - 5.6 %   HbA1c, POC (prediabetic range)     HbA1c, POC (controlled diabetic range)      ASSESSMENT AND PLAN: 1. Pain of right upper extremity 2. Carpal tunnel syndrome of right wrist Discussed diagnosis of carpal tunnel syndrome.  I think he may also have some compression of the ulnar nerve at the elbow.  I recommend wearing an elbow sleeve which he can purchase over-the-counter.  Wrist splint given for  the right hand.  Since this is been going on for 2 months and is very bothersome to him, will refer for nerve conduction study.  In the meantime we will start him on low-dose of gabapentin to take at bedtime.  Patient advised that the medication can cause drowsiness. -Patient requested a note to be off work today.  Letter given. - gabapentin (NEURONTIN) 300 MG capsule; Take 1 capsule (300 mg total) by mouth at bedtime.  Dispense: 30 capsule; Refill: 3 - Ambulatory referral to Neurology  3. Essential hypertension Not at goal.  Add low-dose of amlodipine. - lisinopril-hydrochlorothiazide (PRINZIDE,ZESTORETIC) 10-12.5 MG tablet; Take 1 tablet by mouth daily.  Dispense: 90 tablet; Refill: 2 - CBC - Comprehensive metabolic panel  4. Hyperlipidemia, unspecified hyperlipidemia type - atorvastatin (LIPITOR) 20 MG tablet; Take 1 tablet (20 mg total) by mouth daily.  Dispense: 90 tablet; Refill: 3 - Lipid panel  5. Prediabetes Doing well on metformin.  He is very active. - POCT glucose (manual entry) - POCT glycosylated hemoglobin (Hb A1C) - Microalbumin / creatinine urine ratio - metFORMIN (GLUCOPHAGE) 500 MG tablet; Take 1 tablet (500 mg total) by mouth daily with breakfast.  Dispense: 90 tablet; Refill: 2  6. Colon cancer screening Discussed colon cancer screening.  Patient agreeable to referral for colonoscopy. - Ambulatory referral to Gastroenterology  7. Prostate cancer screening Discussed prostate cancer screening.  Patient agreeable to PSA -  PSA  8.  Tobacco dependence Patient advised to quit smoking. Discussed health risks associated with smoking including lung and other types of cancers, chronic lung diseases and CV risks.. Pt not ready to give trail of quitting but would like to use the nicotine patches to help him cut down.  I told him about 1 800 quit now.  Advised to call and request free nicotine patches.    Patient was given the opportunity to ask questions.  Patient verbalized understanding of the plan and was able to repeat key elements of the plan.   Orders Placed This Encounter  Procedures  . Pneumococcal conjugate vaccine 13-valent  . Microalbumin / creatinine urine ratio  . CBC  . Comprehensive metabolic panel  . Lipid panel  . PSA  . Ambulatory referral to Neurology  . Ambulatory referral to Gastroenterology  . POCT glucose (manual entry)  . POCT glycosylated hemoglobin (Hb A1C)     Requested Prescriptions   Signed Prescriptions Disp Refills  . pneumococcal 13-valent conjugate vaccine (PREVNAR 13) SUSP injection 0.5 mL 0    Sig: Inject 0.5 mLs into the muscle tomorrow at 10 am for 1 dose.  Marland Kitchen lisinopril-hydrochlorothiazide (PRINZIDE,ZESTORETIC) 10-12.5 MG tablet 90 tablet 2    Sig: Take 1 tablet by mouth daily.  Marland Kitchen atorvastatin (LIPITOR) 20 MG tablet 90 tablet 3    Sig: Take 1 tablet (20 mg total) by mouth daily.  . metFORMIN (GLUCOPHAGE) 500 MG tablet 90 tablet 2    Sig: Take 1 tablet (500 mg total) by mouth daily with breakfast.  . amLODipine (NORVASC) 5 MG tablet 90 tablet 3    Sig: Take 1 tablet (5 mg total) by mouth daily.  Marland Kitchen gabapentin (NEURONTIN) 300 MG capsule 30 capsule 3    Sig: Take 1 capsule (300 mg total) by mouth at bedtime.    Return in about 6 weeks (around 11/07/2018).  Karle Plumber, MD, FACP

## 2018-09-26 NOTE — Patient Instructions (Addendum)
Your blood pressure is not controlled.  We have added a new blood pressure medicine called amlodipine 5 mg daily.  Continue to limit salt in the foods.  We have started you on a medication called gabapentin to take at bedtime to help decrease the pain and numbness that you have been experiencing in the right arm and hand.  Use the wrist splint on the right hand at night.  Please get a splint for your right elbow. I have referred you to a neurologist for nerve conduction study.  Influenza Virus Vaccine injection (Fluarix) What is this medicine? INFLUENZA VIRUS VACCINE (in floo EN zuh VAHY ruhs vak SEEN) helps to reduce the risk of getting influenza also known as the flu. This medicine may be used for other purposes; ask your health care provider or pharmacist if you have questions. COMMON BRAND NAME(S): Fluarix, Fluzone What should I tell my health care provider before I take this medicine? They need to know if you have any of these conditions: -bleeding disorder like hemophilia -fever or infection -Guillain-Barre syndrome or other neurological problems -immune system problems -infection with the human immunodeficiency virus (HIV) or AIDS -low blood platelet counts -multiple sclerosis -an unusual or allergic reaction to influenza virus vaccine, eggs, chicken proteins, latex, gentamicin, other medicines, foods, dyes or preservatives -pregnant or trying to get pregnant -breast-feeding How should I use this medicine? This vaccine is for injection into a muscle. It is given by a health care professional. A copy of Vaccine Information Statements will be given before each vaccination. Read this sheet carefully each time. The sheet may change frequently. Talk to your pediatrician regarding the use of this medicine in children. Special care may be needed. Overdosage: If you think you have taken too much of this medicine contact a poison control center or emergency room at once. NOTE: This medicine  is only for you. Do not share this medicine with others. What if I miss a dose? This does not apply. What may interact with this medicine? -chemotherapy or radiation therapy -medicines that lower your immune system like etanercept, anakinra, infliximab, and adalimumab -medicines that treat or prevent blood clots like warfarin -phenytoin -steroid medicines like prednisone or cortisone -theophylline -vaccines This list may not describe all possible interactions. Give your health care provider a list of all the medicines, herbs, non-prescription drugs, or dietary supplements you use. Also tell them if you smoke, drink alcohol, or use illegal drugs. Some items may interact with your medicine. What should I watch for while using this medicine? Report any side effects that do not go away within 3 days to your doctor or health care professional. Call your health care provider if any unusual symptoms occur within 6 weeks of receiving this vaccine. You may still catch the flu, but the illness is not usually as bad. You cannot get the flu from the vaccine. The vaccine will not protect against colds or other illnesses that may cause fever. The vaccine is needed every year. What side effects may I notice from receiving this medicine? Side effects that you should report to your doctor or health care professional as soon as possible: -allergic reactions like skin rash, itching or hives, swelling of the face, lips, or tongue Side effects that usually do not require medical attention (report to your doctor or health care professional if they continue or are bothersome): -fever -headache -muscle aches and pains -pain, tenderness, redness, or swelling at site where injected -weak or tired This list may not  describe all possible side effects. Call your doctor for medical advice about side effects. You may report side effects to FDA at 1-800-FDA-1088. Where should I keep my medicine? This vaccine is only given  in a clinic, pharmacy, doctor's office, or other health care setting and will not be stored at home. NOTE: This sheet is a summary. It may not cover all possible information. If you have questions about this medicine, talk to your doctor, pharmacist, or health care provider.  2018 Elsevier/Gold Standard (2008-06-26 09:30:40)  Pneumococcal Polysaccharide Vaccine: What You Need to Know 1. Why get vaccinated? Vaccination can protect older adults (and some children and younger adults) from pneumococcal disease. Pneumococcal disease is caused by bacteria that can spread from person to person through close contact. It can cause ear infections, and it can also lead to more serious infections of the:  Lungs (pneumonia),  Blood (bacteremia), and  Covering of the brain and spinal cord (meningitis). Meningitis can cause deafness and brain damage, and it can be fatal.  Anyone can get pneumococcal disease, but children under 72 years of age, people with certain medical conditions, adults over 38 years of age, and cigarette smokers are at the highest risk. About 18,000 older adults die each year from pneumococcal disease in the [place]. Treatment of pneumococcal infections with penicillin and other drugs used to be more effective. But some strains of the disease have become resistant to these drugs. This makes prevention of the disease, through vaccination, even more important. 2. Pneumococcal polysaccharide vaccine (PPSV23) Pneumococcal polysaccharide vaccine (PPSV23) protects against 23 types of pneumococcal bacteria. It will not prevent all pneumococcal disease. PPSV23 is recommended for:  All adults 21 years of age and older,  Anyone 2 through 65 years of age with certain long-term health problems,  Anyone 2 through 65 years of age with a weakened immune system,  Adults 21 through 65 years of age who smoke cigarettes or have asthma.  Most people need only one dose of PPSV. A second dose is  recommended for certain high-risk groups. People 36 and older should get a dose even if they have gotten one or more doses of the vaccine before they turned 65. Your healthcare provider can give you more information about these recommendations. Most healthy adults develop protection within 2 to 3 weeks of getting the shot. 3. Some people should not get this vaccine  Anyone who has had a life-threatening allergic reaction to PPSV should not get another dose.  Anyone who has a severe allergy to any component of PPSV should not receive it. Tell your provider if you have any severe allergies.  Anyone who is moderately or severely ill when the shot is scheduled may be asked to wait until they recover before getting the vaccine. Someone with a mild illness can usually be vaccinated.  Children less than 86 years of age should not receive this vaccine.  There is no evidence that PPSV is harmful to either a pregnant woman or to her fetus. However, as a precaution, women who need the vaccine should be vaccinated before becoming pregnant, if possible. 4. Risks of a vaccine reaction With any medicine, including vaccines, there is a chance of side effects. These are usually mild and go away on their own, but serious reactions are also possible. About half of people who get PPSV have mild side effects, such as redness or pain where the shot is given, which go away within about two days. Less than 1 out of  100 people develop a fever, muscle aches, or more severe local reactions. Problems that could happen after any vaccine:  People sometimes faint after a medical procedure, including vaccination. Sitting or lying down for about 15 minutes can help prevent fainting, and injuries caused by a fall. Tell your doctor if you feel dizzy, or have vision changes or ringing in the ears.  Some people get severe pain in the shoulder and have difficulty moving the arm where a shot was given. This happens very  rarely.  Any medication can cause a severe allergic reaction. Such reactions from a vaccine are very rare, estimated at about 1 in a million doses, and would happen within a few minutes to a few hours after the vaccination. As with any medicine, there is a very remote chance of a vaccine causing a serious injury or death. The safety of vaccines is always being monitored. For more information, visit: http://www.aguilar.org/ 5. What if there is a serious reaction? What should I look for? Look for anything that concerns you, such as signs of a severe allergic reaction, very high fever, or unusual behavior. Signs of a severe allergic reaction can include hives, swelling of the face and throat, difficulty breathing, a fast heartbeat, dizziness, and weakness. These would usually start a few minutes to a few hours after the vaccination. What should I do? If you think it is a severe allergic reaction or other emergency that can't wait, call 9-1-1 or get to the nearest hospital. Otherwise, call your doctor. Afterward, the reaction should be reported to the Vaccine Adverse Event Reporting System (VAERS). Your doctor might file this report, or you can do it yourself through the VAERS web site at www.vaers.SamedayNews.es, or by calling 208-431-8570. VAERS does not give medical advice. 6. How can I learn more?  Ask your doctor. He or she can give you the vaccine package insert or suggest other sources of information.  Call your local or state health department.  Contact the Centers for Disease Control and Prevention (CDC): ? Call 804-599-9078 (1-800-CDC-INFO) or ? Visit CDC's website at http://hunter.com/ CDC Pneumococcal Polysaccharide Vaccine VIS (04/05/14) This information is not intended to replace advice given to you by your health care provider. Make sure you discuss any questions you have with your health care provider. Document Released: 09/26/2006 Document Revised: 08/19/2016 Document Reviewed:  08/19/2016 Elsevier Interactive Patient Education  2017 Elsevier Inc.   Pneumococcal Conjugate Vaccine (PCV13) What You Need to Know 1. Why get vaccinated? Vaccination can protect both children and adults from pneumococcal disease. Pneumococcal disease is caused by bacteria that can spread from person to person through close contact. It can cause ear infections, and it can also lead to more serious infections of the:  Lungs (pneumonia),  Blood (bacteremia), and  Covering of the brain and spinal cord (meningitis).  Pneumococcal pneumonia is most common among adults. Pneumococcal meningitis can cause deafness and brain damage, and it kills about 1 child in 10 who get it. Anyone can get pneumococcal disease, but children under 19 years of age and adults 11 years and older, people with certain medical conditions, and cigarette smokers are at the highest risk. Before there was a vaccine, the [place] saw:  more than 700 cases of meningitis,  about 13,000 blood infections,  about 5 million ear infections, and  about 200 deaths  in children under 5 each year from pneumococcal disease. Since vaccine became available, severe pneumococcal disease in these children has fallen by 88%. About 18,000  older adults die of pneumococcal disease each year in the [place]. Treatment of pneumococcal infections with penicillin and other drugs is not as effective as it used to be, because some strains of the disease have become resistant to these drugs. This makes prevention of the disease, through vaccination, even more important. 2. PCV13 vaccine Pneumococcal conjugate vaccine (called PCV13) protects against 13 types of pneumococcal bacteria. PCV13 is routinely given to children at 2, 4, 6, and 59-4 months of age. It is also recommended for children and adults 69 to 62 years of age with certain health conditions, and for all adults 1 years of age and older. Your doctor can give you details. 3.  Some people should not get this vaccine Anyone who has ever had a life-threatening allergic reaction to a dose of this vaccine, to an earlier pneumococcal vaccine called PCV7, or to any vaccine containing diphtheria toxoid (for example, DTaP), should not get PCV13. Anyone with a severe allergy to any component of PCV13 should not get the vaccine. Tell your doctor if the person being vaccinated has any severe allergies. If the person scheduled for vaccination is not feeling well, your healthcare provider might decide to reschedule the shot on another day. 4. Risks of a vaccine reaction With any medicine, including vaccines, there is a chance of reactions. These are usually mild and go away on their own, but serious reactions are also possible. Problems reported following PCV13 varied by age and dose in the series. The most common problems reported among children were:  About half became drowsy after the shot, had a temporary loss of appetite, or had redness or tenderness where the shot was given.  About 1 out of 3 had swelling where the shot was given.  About 1 out of 3 had a mild fever, and about 1 in 20 had a fever over 102.33F.  Up to about 8 out of 10 became fussy or irritable.  Adults have reported pain, redness, and swelling where the shot was given; also mild fever, fatigue, headache, chills, or muscle pain. Young children who get PCV13 along with inactivated flu vaccine at the same time may be at increased risk for seizures caused by fever. Ask your doctor for more information. Problems that could happen after any vaccine:  People sometimes faint after a medical procedure, including vaccination. Sitting or lying down for about 15 minutes can help prevent fainting, and injuries caused by a fall. Tell your doctor if you feel dizzy, or have vision changes or ringing in the ears.  Some older children and adults get severe pain in the shoulder and have difficulty moving the arm where a shot  was given. This happens very rarely.  Any medication can cause a severe allergic reaction. Such reactions from a vaccine are very rare, estimated at about 1 in a million doses, and would happen within a few minutes to a few hours after the vaccination. As with any medicine, there is a very small chance of a vaccine causing a serious injury or death. The safety of vaccines is always being monitored. For more information, visit: http://www.aguilar.org/ 5. What if there is a serious reaction? What should I look for? Look for anything that concerns you, such as signs of a severe allergic reaction, very high fever, or unusual behavior. Signs of a severe allergic reaction can include hives, swelling of the face and throat, difficulty breathing, a fast heartbeat, dizziness, and weakness-usually within a few minutes to a few hours after  the vaccination. What should I do?  If you think it is a severe allergic reaction or other emergency that can't wait, call 9-1-1 or get the person to the nearest hospital. Otherwise, call your doctor.  Reactions should be reported to the Vaccine Adverse Event Reporting System (VAERS). Your doctor should file this report, or you can do it yourself through the VAERS web site at www.vaers.SamedayNews.es, or by calling (325) 221-6480. ? VAERS does not give medical advice. 6. The National Vaccine Injury Compensation Program The [employer] Vaccine Injury Compensation Program (VICP) is a federal program that was created to compensate people who may have been injured by certain vaccines. Persons who believe they may have been injured by a vaccine can learn about the program and about filing a claim by calling (415)179-3647 or visiting the [place] website at GoldCloset.com.ee. There is a time limit to file a claim for compensation. 7. How can I learn more?  Ask your healthcare provider. He or she can give you the vaccine package insert or suggest other sources of  information.  Call your local or state health department.  Contact the Centers for Disease Control and Prevention (CDC): ? Call 403-487-9392 (1-800-CDC-INFO) or ? Visit CDC's website at http://hunter.com/ Vaccine Information Statement, PCV13 Vaccine (10/17/2014) This information is not intended to replace advice given to you by your health care provider. Make sure you discuss any questions you have with your health care provider. Document Released: 09/26/2006 Document Revised: 08/19/2016 Document Reviewed: 08/19/2016 Elsevier Interactive Patient Education  2017 [employer].

## 2018-09-26 NOTE — Progress Notes (Signed)
Pt states he has been having pain in his right arm and the pain shoots down to his hands and makes his fingers numb  Pt states the pain has bene going on for 2 months  Pt states he take excerdrin for the pain and it doesn't help

## 2018-09-27 LAB — COMPREHENSIVE METABOLIC PANEL
ALK PHOS: 67 IU/L (ref 39–117)
ALT: 27 IU/L (ref 0–44)
AST: 35 IU/L (ref 0–40)
Albumin/Globulin Ratio: 1.5 (ref 1.2–2.2)
Albumin: 4.8 g/dL (ref 3.6–4.8)
BUN/Creatinine Ratio: 16 (ref 10–24)
BUN: 12 mg/dL (ref 8–27)
Bilirubin Total: 0.3 mg/dL (ref 0.0–1.2)
CO2: 24 mmol/L (ref 20–29)
CREATININE: 0.73 mg/dL — AB (ref 0.76–1.27)
Calcium: 10 mg/dL (ref 8.6–10.2)
Chloride: 97 mmol/L (ref 96–106)
GFR calc Af Amer: 113 mL/min/{1.73_m2} (ref >59)
GFR calc non Af Amer: 97 mL/min/{1.73_m2} (ref >59)
GLUCOSE: 72 mg/dL (ref 65–99)
Globulin, Total: 3.2 g/dL (ref 1.5–4.5)
Potassium: 3.7 mmol/L (ref 3.5–5.2)
Sodium: 139 mmol/L (ref 134–144)
Total Protein: 8 g/dL (ref 6.0–8.5)

## 2018-09-27 LAB — CBC
HEMOGLOBIN: 15.9 g/dL (ref 13.0–17.7)
Hematocrit: 45 % (ref 37.5–51.0)
MCH: 31.7 pg (ref 26.6–33.0)
MCHC: 35.3 g/dL (ref 31.5–35.7)
MCV: 90 fL (ref 79–97)
PLATELETS: 250 10*3/uL (ref 150–450)
RBC: 5.01 x10E6/uL (ref 4.14–5.80)
RDW: 14.4 % (ref 12.3–15.4)
WBC: 10.3 10*3/uL (ref 3.4–10.8)

## 2018-09-27 LAB — LIPID PANEL
CHOLESTEROL TOTAL: 158 mg/dL (ref 100–199)
Chol/HDL Ratio: 3.3 ratio (ref 0.0–5.0)
HDL: 48 mg/dL (ref >39)
LDL CALC: 72 mg/dL (ref 0–99)
TRIGLYCERIDES: 189 mg/dL — AB (ref 0–149)
VLDL CHOLESTEROL CAL: 38 mg/dL (ref 5–40)

## 2018-09-27 LAB — PSA: Prostate Specific Ag, Serum: 2.2 ng/mL (ref 0.0–4.0)

## 2018-09-27 LAB — MICROALBUMIN / CREATININE URINE RATIO
Creatinine, Urine: 12.3 mg/dL
Microalb/Creat Ratio: 223.6 mg/g creat — ABNORMAL HIGH (ref 0.0–30.0)
Microalbumin, Urine: 27.5 ug/mL

## 2018-09-29 ENCOUNTER — Telehealth: Payer: Self-pay

## 2018-09-29 NOTE — Telephone Encounter (Signed)
Contacted pt to go over lab results spoke with pt wife and went over lab results. Pt wife doesn't have any questions or concerns

## 2018-11-07 ENCOUNTER — Ambulatory Visit: Payer: Self-pay | Admitting: Internal Medicine

## 2018-11-16 MED FILL — AMLODIPINE BESYLATE 5 MG TA: 5 | 90 days supply | Qty: 90 | Fill #1

## 2018-11-16 MED FILL — ATORVASTATIN 20 MG TABLET: 20 | 90 days supply | Qty: 90 | Fill #1

## 2018-11-21 MED FILL — LISINOPRIL-HCTZ 10-12.5 MG: 10-12.5 | 90 days supply | Qty: 90 | Fill #1

## 2018-11-21 MED FILL — metFORMIN HCL 500 MG TABS: 500 | 30 days supply | Qty: 30 | Fill #1

## 2019-01-18 ENCOUNTER — Ambulatory Visit: Payer: BC Managed Care – PPO | Attending: Family Medicine | Admitting: Physician Assistant

## 2019-01-18 VITALS — BP 150/84 | HR 73 | Temp 98.7°F | Ht 67.0 in | Wt 166.2 lb

## 2019-01-18 DIAGNOSIS — H524 Presbyopia: Secondary | ICD-10-CM

## 2019-01-18 DIAGNOSIS — R7303 Prediabetes: Secondary | ICD-10-CM | POA: Diagnosis not present

## 2019-01-18 DIAGNOSIS — R51 Headache: Secondary | ICD-10-CM | POA: Diagnosis not present

## 2019-01-18 DIAGNOSIS — R519 Headache, unspecified: Secondary | ICD-10-CM

## 2019-01-18 DIAGNOSIS — S0093XA Contusion of unspecified part of head, initial encounter: Secondary | ICD-10-CM | POA: Diagnosis not present

## 2019-01-18 DIAGNOSIS — R413 Other amnesia: Secondary | ICD-10-CM | POA: Diagnosis not present

## 2019-01-18 LAB — GLUCOSE, POCT (MANUAL RESULT ENTRY): POC GLUCOSE: 105 mg/dL — AB (ref 70–99)

## 2019-01-18 MED ORDER — IBUPROFEN 600 MG PO TABS: 600.0000 mg | ORAL_TABLET | Freq: Three times a day (TID) | ORAL | 0 refills | Status: DC | PRN
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
## 2019-02-13 ENCOUNTER — Emergency Department (HOSPITAL_COMMUNITY)
Admission: EM | Admit: 2019-02-13 | Discharge: 2019-02-13 | Disposition: A | Discharge status: Home / Self Care | Payer: BC Managed Care – PPO | Attending: Emergency Medicine | Admitting: Emergency Medicine

## 2019-02-13 ENCOUNTER — Encounter (HOSPITAL_COMMUNITY): Payer: Self-pay | Admitting: *Deleted

## 2019-02-13 ENCOUNTER — Other Ambulatory Visit: Payer: Self-pay

## 2019-02-13 DIAGNOSIS — F17210 Nicotine dependence, cigarettes, uncomplicated: Secondary | ICD-10-CM | POA: Insufficient documentation

## 2019-02-13 DIAGNOSIS — R45851 Suicidal ideations: Secondary | ICD-10-CM | POA: Diagnosis not present

## 2019-02-13 DIAGNOSIS — Z7984 Long term (current) use of oral hypoglycemic drugs: Secondary | ICD-10-CM | POA: Insufficient documentation

## 2019-02-13 DIAGNOSIS — I10 Essential (primary) hypertension: Secondary | ICD-10-CM | POA: Insufficient documentation

## 2019-02-13 DIAGNOSIS — Z79899 Other long term (current) drug therapy: Secondary | ICD-10-CM | POA: Insufficient documentation

## 2019-02-13 DIAGNOSIS — F1020 Alcohol dependence, uncomplicated: Secondary | ICD-10-CM | POA: Diagnosis not present

## 2019-02-13 DIAGNOSIS — F1010 Alcohol abuse, uncomplicated: Secondary | ICD-10-CM

## 2019-02-13 DIAGNOSIS — F329 Major depressive disorder, single episode, unspecified: Secondary | ICD-10-CM | POA: Diagnosis not present

## 2019-02-13 DIAGNOSIS — F32A Depression, unspecified: Secondary | ICD-10-CM

## 2019-02-13 HISTORY — DX: Alcohol abuse, uncomplicated: F10.10

## 2019-02-13 LAB — COMPREHENSIVE METABOLIC PANEL
ALT: 31 U/L (ref 0–44)
AST: 32 U/L (ref 15–41)
Albumin: 4 g/dL (ref 3.5–5.0)
Alkaline Phosphatase: 74 U/L (ref 38–126)
Anion gap: 8 (ref 5–15)
BUN: 11 mg/dL (ref 8–23)
CALCIUM: 9 mg/dL (ref 8.9–10.3)
CO2: 24 mmol/L (ref 22–32)
Chloride: 108 mmol/L (ref 98–111)
Creatinine, Ser: 0.76 mg/dL (ref 0.61–1.24)
GFR calc non Af Amer: >60 mL/min (ref >60)
Glucose, Bld: 96 mg/dL (ref 70–99)
Potassium: 3.8 mmol/L (ref 3.5–5.1)
Sodium: 140 mmol/L (ref 135–145)
Total Bilirubin: 0.3 mg/dL (ref 0.3–1.2)
Total Protein: 8.3 g/dL — ABNORMAL HIGH (ref 6.5–8.1)

## 2019-02-13 LAB — CBC WITH DIFFERENTIAL/PLATELET
Abs Immature Granulocytes: 0.02 10*3/uL (ref 0.00–0.07)
Basophils Absolute: 0.1 10*3/uL (ref 0.0–0.1)
Basophils Relative: 1 %
EOS PCT: 1 %
Eosinophils Absolute: 0.1 10*3/uL (ref 0.0–0.5)
HEMATOCRIT: 45.4 % (ref 39.0–52.0)
Hemoglobin: 14.7 g/dL (ref 13.0–17.0)
Immature Granulocytes: 0 %
LYMPHS ABS: 2.4 10*3/uL (ref 0.7–4.0)
Lymphocytes Relative: 34 %
MCH: 31.1 pg (ref 26.0–34.0)
MCHC: 32.4 g/dL (ref 30.0–36.0)
MCV: 96 fL (ref 80.0–100.0)
Monocytes Absolute: 0.6 10*3/uL (ref 0.1–1.0)
Monocytes Relative: 9 %
Neutro Abs: 3.9 10*3/uL (ref 1.7–7.7)
Neutrophils Relative %: 55 %
Platelets: 255 10*3/uL (ref 150–400)
RBC: 4.73 MIL/uL (ref 4.22–5.81)
RDW: 14.3 % (ref 11.5–15.5)
WBC: 7 10*3/uL (ref 4.0–10.5)
nRBC: 0 % (ref 0.0–0.2)

## 2019-02-13 LAB — RAPID URINE DRUG SCREEN, HOSP PERFORMED
Amphetamines: NOT DETECTED
Barbiturates: NOT DETECTED
Benzodiazepines: NOT DETECTED
Cocaine: NOT DETECTED
Opiates: NOT DETECTED
TETRAHYDROCANNABINOL: NOT DETECTED

## 2019-02-13 LAB — ACETAMINOPHEN LEVEL: Acetaminophen (Tylenol), Serum: <10 ug/mL — ABNORMAL LOW (ref 10–30)

## 2019-02-13 LAB — SALICYLATE LEVEL: Salicylate Lvl: <7 mg/dL (ref 2.8–30.0)

## 2019-02-13 LAB — ETHANOL: Alcohol, Ethyl (B): 236 mg/dL — ABNORMAL HIGH (ref <10)

## 2019-02-13 MED ORDER — VITAMIN B-1 100 MG PO TABS
100.0000 mg | ORAL_TABLET | Freq: Every day | ORAL | Status: DC
  Administered 2019-02-13: 100 mg via ORAL
  Filled 2019-02-13: qty 1

## 2019-02-13 MED ORDER — LORAZEPAM 1 MG PO TABS
0.0000 mg | ORAL_TABLET | Freq: Four times a day (QID) | ORAL | Status: DC
  Administered 2019-02-13: 2 mg via ORAL
  Filled 2019-02-13: qty 2

## 2019-02-13 MED ORDER — LORAZEPAM 1 MG PO TABS: 0.0000 mg | ORAL_TABLET | Freq: Two times a day (BID) | ORAL | Status: DC

## 2019-02-13 MED ORDER — LORAZEPAM 2 MG/ML IJ SOLN: 0.0000 mg | Freq: Two times a day (BID) | INTRAMUSCULAR | Status: DC

## 2019-02-13 MED ORDER — THIAMINE HCL 100 MG/ML IJ SOLN: 100.0000 mg | Freq: Every day | INTRAMUSCULAR | Status: DC

## 2019-02-13 MED ORDER — LORAZEPAM 2 MG/ML IJ SOLN: 0.0000 mg | Freq: Four times a day (QID) | INTRAMUSCULAR | Status: DC

## 2019-02-13 NOTE — ED Provider Notes (Signed)
[facility] DEPT Provider Note   CSN: [id number] Arrival date & time: 02/13/19  7096    History   Chief Complaint Chief Complaint  Patient presents with  . ETOH Detox    HPI Chad Cantu is a 68 y.o. male.     HPI   Pt is a 68 y/o male with a h/o ETOH abuse, HTN, HLD, substance abuse, who presents to the ED today requesting ETOH detox and for evaluation of suicidal ideations.   Patients granddaughter states that he was planning to go to an inpatient facility to help stop drinking and he voiced that he was having suicidal thoughts to his daughter and his granddaughter. He states his plan would be to drink himself to death. He denies any HI. Denies auditory hallucinations, but states he sees shadows.  He states he has depression and he feels like he can't solve the things he needs to solve and he feels like he can't make ends meet. He states that if he can't help his family then there is no need for him to be around.  He states he drinks all day long if he is not working and estimates that he drinks about 12-13 16 oz cans per day. He uses marijuana occasionally. He denies drug use.   Past Medical History:  Diagnosis Date  . ETOH abuse   . Hyperlipidemia   . Hypertension   . Substance abuse ([place])    ETOH, cocaine.  Remission    Patient Active Problem List   Diagnosis Date Noted  . Alcohol use disorder, severe, dependence ([place])   . Memory difficulty 05/02/2017  . Rash, skin 05/02/2017  . Overactive bladder 05/02/2017  . Prediabetes 05/01/2017  . Alcohol use disorder 05/01/2017  . HLD (hyperlipidemia) 04/09/2015  . Essential hypertension 04/08/2015  . Smoking 04/08/2015    Past Surgical History:  Procedure Laterality Date  . LAPAROSCOPIC GASTROTOMY W/ REPAIR OF ULCER          Home Medications    Prior to Admission medications   Medication Sig Start Date End Date Taking? Authorizing Provider  amLODipine (NORVASC) 5 MG tablet  Take 1 tablet (5 mg total) by mouth daily. Patient not taking: Reported on 02/13/2019 09/26/18   [name], MD  atorvastatin (LIPITOR) 20 MG tablet Take 1 tablet (20 mg total) by mouth daily. Patient not taking: Reported on 02/13/2019 09/26/18   [name], MD  gabapentin (NEURONTIN) 300 MG capsule Take 1 capsule (300 mg total) by mouth at bedtime. Patient not taking: Reported on 01/18/2019 09/26/18   [name], MD  ibuprofen (ADVIL,MOTRIN) 600 MG tablet Take 1 tablet (600 mg total) by mouth every 8 (eight) hours as needed. Prn headache or pain Patient not taking: Reported on 02/13/2019 01/18/19   Argentina Donovan, PA-C  lisinopril-hydrochlorothiazide (PRINZIDE,ZESTORETIC) 10-12.5 MG tablet Take 1 tablet by mouth daily. Patient not taking: Reported on 02/13/2019 09/26/18   [name], MD  metFORMIN (GLUCOPHAGE) 500 MG tablet Take 1 tablet (500 mg total) by mouth daily with breakfast. Patient not taking: Reported on 02/13/2019 09/26/18   [name], MD    Family History Family History  Problem Relation Age of Onset  . Diabetes Mother     Social History Social History   Tobacco Use  . Smoking status: Heavy Tobacco Smoker    Packs/day: 1.00    Types: Cigarettes  . Smokeless tobacco: Never Used  Substance Use Topics  . Alcohol use: Yes  Comment: 6 cans a day of 16 oz [employer]  . Drug use: Yes    Types: Marijuana    Comment: THC and Crack last usage 4-5 years ago     Allergies   Patient has no known allergies.   Review of Systems Review of Systems  Constitutional: Negative for fever.  HENT: Negative for ear pain and sore throat.   Eyes: Negative for pain and visual disturbance.  Respiratory: Negative for shortness of breath.   Cardiovascular: Negative for chest pain.  Gastrointestinal: Negative for abdominal pain, constipation, diarrhea, nausea and vomiting.  Genitourinary: Negative for flank pain.  Musculoskeletal: Negative for myalgias.    Skin: Negative for color change and rash.  Neurological: Negative for headaches.  Psychiatric/Behavioral: Positive for dysphoric mood, hallucinations (states he sees shadows) and suicidal ideas.       No HI. No auditory hallucinations.   All other systems reviewed and are negative.   Physical Exam Updated Vital Signs BP (!) 168/117   Pulse (!) 104   Temp 98.4 F (36.9 C) (Oral)   Resp 17   Ht 5\' 6"  (1.676 m)   Wt 78.1 kg   SpO2 100%   BMI 27.80 kg/m   Physical Exam Vitals signs and nursing note reviewed.  Constitutional:      General: He is not in acute distress.    Appearance: He is well-developed. He is toxic-appearing. He is not ill-appearing.  HENT:     Head: Normocephalic and atraumatic.     Mouth/Throat:     Mouth: Mucous membranes are moist.     Pharynx: No oropharyngeal exudate or posterior oropharyngeal erythema.  Eyes:     Conjunctiva/sclera: Conjunctivae normal.  Neck:     Musculoskeletal: Neck supple.  Cardiovascular:     Rate and Rhythm: Normal rate and regular rhythm.     Pulses: Normal pulses.     Heart sounds: Normal heart sounds. No murmur.  Pulmonary:     Effort: Pulmonary effort is normal. No respiratory distress.     Breath sounds: Normal breath sounds. No stridor. No wheezing or rhonchi.  Abdominal:     General: Bowel sounds are normal.     Palpations: Abdomen is soft.     Tenderness: There is no abdominal tenderness. There is no guarding or rebound.  Skin:    General: Skin is warm and dry.  Neurological:     Mental Status: He is alert.  Psychiatric:        Thought Content: Thought content normal.        Judgment: Judgment normal.     Comments: tearful      ED Treatments / Results  Labs (all labs ordered are listed, but only abnormal results are displayed) Labs Reviewed  COMPREHENSIVE METABOLIC PANEL - Abnormal; Notable for the following components:      Result Value   Total Protein 8.3 (*)    All other components within normal  limits  ETHANOL - Abnormal; Notable for the following components:   Alcohol, Ethyl (B) 236 (*)    All other components within normal limits  ACETAMINOPHEN LEVEL - Abnormal; Notable for the following components:   Acetaminophen (Tylenol), Serum <10 (*)    All other components within normal limits  CBC WITH DIFFERENTIAL/PLATELET  RAPID URINE DRUG SCREEN, HOSP PERFORMED  SALICYLATE LEVEL    EKG None  Radiology No results found.  Procedures Procedures (including critical care time)  Medications Ordered in ED Medications  LORazepam (ATIVAN) injection 0-4 mg (  Intravenous See Alternative 02/13/19 1001)    Or  LORazepam (ATIVAN) tablet 0-4 mg (2 mg Oral Given 02/13/19 1001)  LORazepam (ATIVAN) injection 0-4 mg (has no administration in time range)    Or  LORazepam (ATIVAN) tablet 0-4 mg (has no administration in time range)  thiamine (VITAMIN B-1) tablet 100 mg (100 mg Oral Given 02/13/19 1220)    Or  thiamine (B-1) injection 100 mg ( Intravenous See Alternative 02/13/19 1220)     Initial Impression / Assessment and Plan / ED Course  I have reviewed the triage vital signs and the nursing notes.  Pertinent labs & imaging results that were available during my care of the patient were reviewed by me and considered in my medical decision making (see chart for details).     Final Clinical Impressions(s) / ED Diagnoses   Final diagnoses:  ETOH abuse  Depression, unspecified depression type   Pt presents to the ED today requesting ETOH detox. He also endorsed suicidal thoughts to his daughter and granddaughter and voiced this same sentiment to me. His plan is to drink himself to death. He denies HI. Denies auditory hallucinations. Reports visual hallucinations. Reports heavy drinking daily for many years. Uses marijuana but no other drugs. Denies complicated h/o ETOH withdrawal.  CBC WNL CMP WNL UDS negative  Salicylate level negative Acetaminophen level negative ETOH level  236 Consult for peer support was placed.  9:24 AM Pt does not have an acute medical problem that would require further workup or admission to the hospital at this time. He is appropriate for evaluation by behavioral health at this time.   Pt evaluated by by behavioral health team who cleared the patient psychiatrically.  Care support evaluated the patient and he has been accepted to [facility] for treatment of alcohol abuse.  ED Discharge Orders    None       Bishop Dublin 02/13/19 1222    Valarie Merino, MD 02/15/19 2025

## 2019-02-13 NOTE — ED Notes (Signed)
Bed: WLPT4 Expected date:  Expected time:  Means of arrival:  Comments: 

## 2019-02-13 NOTE — Patient Outreach (Signed)
CPSS provided the patient with CPSS contact information. Patient has already been accepted to [facility] in [place], [place] for alcohol detox and dual diagnosis treatment. CPSS strongly encouraged the patient to continue to stay in contact with CPSS for further substance use recovery support with lived recovery experience from [facility].

## 2019-02-13 NOTE — ED Triage Notes (Addendum)
Pt states he wants help to stop drinking, he is very direct  that he is not SI/HI. He states he only wants help to get into an inpatient facility. He is aware we will be able to provide resources. States he drinks about 6-16 oz ice house cans a day. Pt is ambulatory with steady gait and no s/s of intoxication.
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
## 2019-02-13 NOTE — Patient Outreach (Signed)
CPSS met with Pt an was made aware that he wants Detox services. Pt was accepted to [facility] Accepted by Nadia Meyer at 11: 45 am. Nurse to Nurse contact information [phone].  Transportation provided by [facility]  services.  

## 2019-02-28 MED FILL — metFORMIN HCL 500 MG TABS: 500 | 90 days supply | Qty: 90 | Fill #2

## 2019-03-09 MED FILL — LISINOPRIL-HCTZ 10-12.5 MG: 10-12.5 | 90 days supply | Qty: 90 | Fill #2

## 2019-03-09 MED FILL — ATORVASTATIN 20 MG TABLET: 20 | 90 days supply | Qty: 90 | Fill #2

## 2019-03-09 MED FILL — AMLODIPINE BESYLATE 5 MG TA: 5 | 90 days supply | Qty: 90 | Fill #2

## 2019-03-12 ENCOUNTER — Telehealth: Payer: Self-pay | Admitting: Diagnostic Neuroimaging

## 2019-03-12 NOTE — Telephone Encounter (Signed)
Called patient regarding setting up a webex meeting for his new patient appointment on 4/1. No answer on his phone so I LVM. I then called his wife listed in his chart, LVM on her phone as well.

## 2019-03-13 MED FILL — NALTREXONE 50 MG TABLET: 50 | 30 days supply | Qty: 30 | Fill #0

## 2019-03-13 NOTE — Telephone Encounter (Signed)
Called patient back today and spoke with wife, [name]. I explained to her that our office is currently closed under the circumstances of COVID-19, but Dr. Leta Baptist can offer a virtual visit. I explained to her all that this would entail. She stated that she would rather reschedule so patient and doctor could be face-to-face. I then tentatively rescheduled appointment to May.

## 2019-03-14 ENCOUNTER — Ambulatory Visit: Payer: BC Managed Care – PPO | Admitting: Diagnostic Neuroimaging

## 2019-04-03 NOTE — Telephone Encounter (Signed)
Called patient's wife and advised her that our office will still not see patient in May due to Covid 19. Asked to reschedule as she had stated they want to be seen in the office. She agreed; we rescheduled for mid June. She verbalized understanding, appreciation.

## 2019-04-17 ENCOUNTER — Ambulatory Visit: Payer: BC Managed Care – PPO | Admitting: Diagnostic Neuroimaging

## 2019-05-28 ENCOUNTER — Telehealth: Payer: Self-pay | Admitting: *Deleted

## 2019-05-28 NOTE — Telephone Encounter (Signed)
Have made multiple attempts to reach patient on mobile #. It does not go through. His appt on Wed needs to be converted to video visit or rescheduled. Will try again tomorrow.

## 2019-05-29 NOTE — Telephone Encounter (Signed)
Reached wife and informed her that due to current COVID 19 pandemic, our office is severely reducing in person visits in order to minimize the risk to our patients and healthcare providers. We recommend to convert your husnand's appointment to a video visit or reschedule for July. She asked to have July appt; we rescheduled. She verbalized understanding, appreciation.

## 2019-05-29 NOTE — Telephone Encounter (Addendum)
Chad Cantu on mobile advising his appt needs to be converted to video or rescheduled due to current COVID 19 pandemic. Our office is severely reducing in person visits in order to minimize the risk to our patients and healthcare providers. I advised I will call his wife's phone (on DPR).  Called wife, Chad Cantu's phone and Chad Cantu advising her that due to current COVID 19 pandemic, our office is severely reducing in person visits in order to minimize the risk to our patients and healthcare providers. We recommend to convert her husband's appointment to a video visit.  Otherwise he can be reschedule for in office visit in July. Left number and requested she call back today.

## 2019-05-30 ENCOUNTER — Ambulatory Visit: Payer: Self-pay | Admitting: Diagnostic Neuroimaging

## 2019-06-11 ENCOUNTER — Telehealth: Payer: Self-pay | Admitting: Diagnostic Neuroimaging

## 2019-06-11 NOTE — Telephone Encounter (Signed)
lvm for pt to call back to r/s 06/22/19 apt due to office being closed/

## 2019-06-11 NOTE — Telephone Encounter (Signed)
LVM requesting call back to reschedule before July 10th. Advised we are closed on Fridays, but his appt can be moved sooner. Left office #.

## 2019-06-22 ENCOUNTER — Ambulatory Visit: Payer: Self-pay | Admitting: Diagnostic Neuroimaging

## 2019-08-30 MED FILL — AMOXICILLIN 500 MG CAPSULE: 500 | 7 days supply | Qty: 28 | Fill #0

## 2019-09-18 ENCOUNTER — Encounter: Payer: BC Managed Care – PPO | Admitting: Internal Medicine

## 2019-09-21 MED FILL — LISINOPRIL 20 MG TABLET: 20 | 30 days supply | Qty: 30 | Fill #0

## 2019-10-18 ENCOUNTER — Encounter: Payer: BC Managed Care – PPO | Admitting: Internal Medicine

## 2019-10-19 ENCOUNTER — Encounter: Payer: Self-pay | Admitting: Internal Medicine

## 2019-11-01 MED FILL — LISINOPRIL 20 MG TABLET: 20 | 30 days supply | Qty: 30 | Fill #1

## 2019-12-27 MED FILL — LISINOPRIL 20 MG TABLET: 20 | 30 days supply | Qty: 30 | Fill #2

## 2020-03-25 ENCOUNTER — Other Ambulatory Visit: Payer: Self-pay | Admitting: Internal Medicine

## 2020-03-26 MED FILL — ATORVASTATIN CALCIUM 20 MG: 20 | 90 days supply | Qty: 90 | Fill #0

## 2020-03-26 MED FILL — AMLODIPINE BESYLATE 5 MG TA: 5 | 90 days supply | Qty: 90 | Fill #0

## 2020-03-26 MED FILL — LISINOPRIL 20 MG TABLET: 20 | 90 days supply | Qty: 90 | Fill #0

## 2020-06-05 ENCOUNTER — Encounter: Payer: Self-pay | Admitting: Internal Medicine

## 2020-06-05 ENCOUNTER — Other Ambulatory Visit: Payer: Self-pay

## 2020-06-05 ENCOUNTER — Ambulatory Visit: Payer: Medicare Other | Attending: Internal Medicine | Admitting: Internal Medicine

## 2020-06-05 ENCOUNTER — Other Ambulatory Visit: Payer: Self-pay | Admitting: Internal Medicine

## 2020-06-05 VITALS — BP 156/94 | HR 91 | Temp 98.2°F | Resp 16 | Ht 67.0 in | Wt 165.6 lb

## 2020-06-05 DIAGNOSIS — Z1211 Encounter for screening for malignant neoplasm of colon: Secondary | ICD-10-CM

## 2020-06-05 DIAGNOSIS — Z23 Encounter for immunization: Secondary | ICD-10-CM

## 2020-06-05 DIAGNOSIS — Z833 Family history of diabetes mellitus: Secondary | ICD-10-CM | POA: Insufficient documentation

## 2020-06-05 DIAGNOSIS — E7800 Pure hypercholesterolemia, unspecified: Secondary | ICD-10-CM | POA: Diagnosis not present

## 2020-06-05 DIAGNOSIS — M25512 Pain in left shoulder: Secondary | ICD-10-CM | POA: Insufficient documentation

## 2020-06-05 DIAGNOSIS — G8929 Other chronic pain: Secondary | ICD-10-CM | POA: Diagnosis not present

## 2020-06-05 DIAGNOSIS — F17200 Nicotine dependence, unspecified, uncomplicated: Secondary | ICD-10-CM

## 2020-06-05 DIAGNOSIS — F1011 Alcohol abuse, in remission: Secondary | ICD-10-CM | POA: Insufficient documentation

## 2020-06-05 DIAGNOSIS — F17210 Nicotine dependence, cigarettes, uncomplicated: Secondary | ICD-10-CM | POA: Insufficient documentation

## 2020-06-05 DIAGNOSIS — Z79899 Other long term (current) drug therapy: Secondary | ICD-10-CM | POA: Diagnosis not present

## 2020-06-05 DIAGNOSIS — I10 Essential (primary) hypertension: Secondary | ICD-10-CM | POA: Insufficient documentation

## 2020-06-05 DIAGNOSIS — R7303 Prediabetes: Secondary | ICD-10-CM | POA: Diagnosis not present

## 2020-06-05 DIAGNOSIS — Z125 Encounter for screening for malignant neoplasm of prostate: Secondary | ICD-10-CM

## 2020-06-05 MED ORDER — LISINOPRIL-HYDROCHLOROTHIAZIDE 10-12.5 MG PO TABS: 1.0000 | ORAL_TABLET | Freq: Every day | ORAL | 2 refills | Status: DC

## 2020-06-05 MED ORDER — METFORMIN HCL 500 MG PO TABS: 500.0000 mg | ORAL_TABLET | Freq: Every day | ORAL | 2 refills | Status: DC

## 2020-06-05 MED ORDER — AMLODIPINE BESYLATE 10 MG PO TABS: 10.0000 mg | ORAL_TABLET | Freq: Every day | ORAL | 3 refills | Status: DC

## 2020-06-05 MED ORDER — CHANTIX STARTING MONTH PAK 0.5 MG X 11 & 1 MG X 42 PO TABS: ORAL_TABLET | ORAL | 0 refills | Status: DC

## 2020-06-05 MED ORDER — ATORVASTATIN CALCIUM 20 MG PO TABS: 20.0000 mg | ORAL_TABLET | Freq: Every day | ORAL | 3 refills | Status: DC

## 2020-06-05 MED FILL — AMLODIPINE BESYLATE 10 MG T: 10 | 90 days supply | Qty: 90 | Fill #0

## 2020-06-05 MED FILL — ATORVASTATIN CALCIUM 20 MG: 20 | 90 days supply | Qty: 90 | Fill #0

## 2020-06-05 MED FILL — METFORMIN HCL 500 MG TABS: 500 | 90 days supply | Qty: 90 | Fill #0

## 2020-06-05 MED FILL — LISINOPRIL-HCTZ 10-12.5 MG: 10-12.5 | 90 days supply | Qty: 90 | Fill #0

## 2020-06-05 NOTE — Progress Notes (Deleted)
Patient ID: Chad Cantu, male    DOB: [birth year]  MRN: [id number]  CC: Hypertension   Subjective: Chad Cantu is a 69 y.o. male who presents for chronic ds management.  I last saw him 09/2018. Was seeing a doctor in [place] His concerns today include:  Hx of prediabetes, HTN, tobacco, hyperlipidemia, CTS RT, ETOH use disorder admitted for detox 07/2016 at [place] Currently taking: see medication list Med Adherence: [x]  Yes    []  No Medication side effects: []  Yes    [x]  No Adherence with salt restriction: [x]  Yes    []  No Home Monitoring?: [x]  Yes    []  No Monitoring Frequency: QOD.  Does not recall # Home BP results range: []  Yes    []  No SOB? []  Yes    [x]  No Chest Pain?: []  Yes    [x]  No Leg swelling?: []  Yes    [x]  No Headaches?: []  Yes    [x]  No Dizziness? []  Yes    [x]  No Comments:    HL:  Still taking Lipitor and tolerating  Tob Dep: 1/3 pk/day.  Ready to quit. Does not want the patches ETOH: drinks 16 oz 2 at a time but not every day.    C/o pain in LT shoulder x 3 mths. Slip and fell will power washing a floor.  Landed on LT side.  Did not start hurting  PreDM:  Compliant with Metformin. Eats candy off and on Not much exercise  Patient Active Problem List   Diagnosis Date Noted  . Alcohol use disorder, severe, dependence ([place])   . Memory difficulty 05/02/2017  . Rash, skin 05/02/2017  . Overactive bladder 05/02/2017  . Prediabetes 05/01/2017  . Alcohol use disorder 05/01/2017  . HLD (hyperlipidemia) 04/09/2015  . Essential hypertension 04/08/2015  . Smoking 04/08/2015     Current Outpatient Medications on File Prior to Visit  Medication Sig Dispense Refill  . gabapentin (NEURONTIN) 300 MG capsule Take 1 capsule (300 mg total) by mouth at bedtime. (Patient not taking: Reported on 01/18/2019) 30 capsule 3   No current facility-administered medications on file prior to visit.    No Known Allergies  Social History   Socioeconomic  History  . Marital status: Married    Spouse name: Not on file  . Number of children: Not on file  . Years of education: Not on file  . Highest education level: Not on file  Occupational History  . Not on file  Tobacco Use  . Smoking status: Heavy Tobacco Smoker    Packs/day: 1.00    Types: Cigarettes  . Smokeless tobacco: Never Used  Vaping Use  . Vaping Use: Never used  Substance and Sexual Activity  . 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 Drug use: Yes    Types: Marijuana    Comment: THC and Crack last usage 4-5 years ago  . Sexual activity: Yes  Other Topics Concern  . Not on file  Social History Narrative  . Not on file   Social Determinants of Health   Financial Resource Strain:   . Difficulty of Paying Living Expenses:   Food Insecurity:   . Worried About Charity fundraiser in the Last Year:   . Arboriculturist in the Last Year:   Transportation Needs:   . Film/video editor (Medical):   Marland Kitchen Lack of Transportation (Non-Medical):   Physical Activity:   .  Days of Exercise per Week:   . Minutes of Exercise per Session:   Stress:   . Feeling of Stress :   Social Connections:   . Frequency of Communication with Friends and Family:   . Frequency of Social Gatherings with Friends and Family:   . Attends Religious Services:   . Active Member of Clubs or Organizations:   . Attends Archivist Meetings:   Marland Kitchen Marital Status:   Intimate Partner Violence:   . Fear of Current or Ex-Partner:   . Emotionally Abused:   Marland Kitchen Physically Abused:   . Sexually Abused:     Family History  Problem Relation Age of Onset  . Diabetes Mother     Past Surgical History:  Procedure Laterality Date  . LAPAROSCOPIC GASTROTOMY W/ REPAIR OF ULCER      ROS: Review of Systems Negative except as stated above  PHYSICAL EXAM: BP (!) 156/94   Pulse 91   Temp 98.2 F (36.8 C)   Resp 16   Ht 5\' 7"  (1.702 m)   Wt 165 lb 9.6 oz (75.1 kg)    SpO2 98%   BMI 25.94 kg/m   Wt Readings from Last 3 Encounters:  06/05/20 165 lb 9.6 oz (75.1 kg)  02/13/19 172 lb 4 oz (78.1 kg)  01/18/19 166 lb 3.2 oz (75.4 kg)    Physical Exam   {male adult master:[id number]}  CMP Latest Ref Rng & Units 02/13/2019 09/26/2018 06/02/2017  Glucose 70 - 99 mg/dL 96 72 -  BUN 8 - 23 mg/dL 11 12 -  Creatinine 0.61 - 1.24 mg/dL 0.76 0.73(L) -  Sodium 135 - 145 mmol/L 140 139 -  Potassium 3.5 - 5.1 mmol/L 3.8 3.7 -  Chloride 98 - 111 mmol/L 108 97 -  CO2 22 - 32 mmol/L 24 24 -  Calcium 8.9 - 10.3 mg/dL 9.0 10.0 -  Total Protein 6.5 - 8.1 g/dL 8.3(H) 8.0 7.5  Total Bilirubin 0.3 - 1.2 mg/dL 0.3 0.3 0.3  Alkaline Phos 38 - 126 U/L 74 67 65  AST 15 - 41 U/L 32 35 38  ALT 0 - 44 U/L 31 27 37   Lipid Panel     Component Value Date/Time   CHOL 158 09/26/2018 1138   TRIG 189 (H) 09/26/2018 1138   HDL 48 09/26/2018 1138   CHOLHDL 3.3 09/26/2018 1138   CHOLHDL 6.5 (H) 01/16/2016 1052   VLDL NOT CALC 01/16/2016 1052   LDLCALC 72 09/26/2018 1138    CBC    Component Value Date/Time   WBC 7.0 02/13/2019 0840   RBC 4.73 02/13/2019 0840   HGB 14.7 02/13/2019 0840   HGB 15.9 09/26/2018 1138   HCT 45.4 02/13/2019 0840   HCT 45.0 09/26/2018 1138   PLT 255 02/13/2019 0840   PLT 250 09/26/2018 1138   MCV 96.0 02/13/2019 0840   MCV 90 09/26/2018 1138   MCH 31.1 02/13/2019 0840   MCHC 32.4 02/13/2019 0840   RDW 14.3 02/13/2019 0840   RDW 14.4 09/26/2018 1138   LYMPHSABS 2.4 02/13/2019 0840   MONOABS 0.6 02/13/2019 0840   EOSABS 0.1 02/13/2019 0840   BASOSABS 0.1 02/13/2019 0840    ASSESSMENT AND PLAN:  There are no diagnoses linked to this encounter.   Patient was given the opportunity to ask questions.  Patient verbalized understanding of the plan and was able to repeat key elements of the plan.   Orders Placed This Encounter  Procedures  . DG Shoulder  Left  . Pneumococcal polysaccharide vaccine 23-valent greater than or equal to 2yo  subcutaneous/IM  . CBC  . Comprehensive metabolic panel  . Lipid panel  . Hemoglobin A1c  . Ambulatory referral to Gastroenterology     Requested Prescriptions   Signed Prescriptions Disp Refills  . lisinopril-hydrochlorothiazide (ZESTORETIC) 10-12.5 MG tablet 90 tablet 2    Sig: Take 1 tablet by mouth daily.  Marland Kitchen atorvastatin (LIPITOR) 20 MG tablet 90 tablet 3    Sig: Take 1 tablet (20 mg total) by mouth daily.  Marland Kitchen amLODipine (NORVASC) 10 MG tablet 90 tablet 3    Sig: Take 1 tablet (10 mg total) by mouth daily.  . metFORMIN (GLUCOPHAGE) 500 MG tablet 90 tablet 2    Sig: Take 1 tablet (500 mg total) by mouth daily with breakfast.  . varenicline (CHANTIX STARTING MONTH PAK) 0.5 MG X 11 & 1 MG X 42 tablet 53 tablet 0    Sig: Take one 0.5 mg tablet by mouth once daily for 3 days, then increase to one 0.5 mg tablet twice daily for 4 days, then increase to one 1 mg tablet twice daily.    Return in about 4 months (around 10/05/2020).  Karle Plumber, MD, FACP

## 2020-06-05 NOTE — Patient Instructions (Addendum)
Your blood pressure is not at goal.  We have increased the amlodipine to 10 mg a day.  Continue lisinopril/hydrochlorothiazide.  Try to limit the salt in the foods.  We have started you on a medication called Chantix to help you to quit smoking.  Please go to the radiology department at [facility] to get the x-ray of the left shoulder.  Pneumococcal Polysaccharide Vaccine (PPSV23): What You Need to Know 1. Why get vaccinated? Pneumococcal polysaccharide vaccine (PPSV23) can prevent pneumococcal disease. Pneumococcal disease refers to any illness caused by pneumococcal bacteria. These bacteria can cause many types of illnesses, including pneumonia, which is an infection of the lungs. Pneumococcal bacteria are one of the most common causes of pneumonia. Besides pneumonia, pneumococcal bacteria can also cause:  Ear infections  Sinus infections  Meningitis (infection of the tissue covering the brain and spinal cord)  Bacteremia (bloodstream infection) Anyone can get pneumococcal disease, but children under 1 years of age, people with certain medical conditions, adults 39 years or older, and cigarette smokers are at the highest risk. Most pneumococcal infections are mild. However, some can result in long-term problems, such as brain damage or hearing loss. Meningitis, bacteremia, and pneumonia caused by pneumococcal disease can be fatal. 2. PPSV23 PPSV23 protects against 23 types of bacteria that cause pneumococcal disease. PPSV23 is recommended for:  All adults 40 years or older,  Anyone 2 years or older with certain medical conditions that can lead to an increased risk for pneumococcal disease. Most people need only one dose of PPSV23. A second dose of PPSV23, and another type of pneumococcal vaccine called PCV13, are recommended for certain high-risk groups. Your health care provider can give you more information. People 65 years or older should get a dose of PPSV23 even if they  have already gotten one or more doses of the vaccine before they turned 44. 3. Talk with your health care provider Tell your vaccine provider if the person getting the vaccine:  Has had an allergic reaction after a previous dose of PPSV23, or has any severe, life-threatening allergies. In some cases, your health care provider may decide to postpone PPSV23 vaccination to a future visit. People with minor illnesses, such as a cold, may be vaccinated. People who are moderately or severely ill should usually wait until they recover before getting PPSV23. Your health care provider can give you more information. 4. Risks of a vaccine reaction  Redness or pain where the shot is given, feeling tired, fever, or muscle aches can happen after PPSV23. People sometimes faint after medical procedures, including vaccination. Tell your provider if you feel dizzy or have vision changes or ringing in the ears. As with any medicine, there is a very remote chance of a vaccine causing a severe allergic reaction, other serious injury, or death. 5. What if there is a serious problem? An allergic reaction could occur after the vaccinated person leaves the clinic. If you see signs of a severe allergic reaction (hives, swelling of the face and throat, difficulty breathing, a fast heartbeat, dizziness, or weakness), call 9-1-1 and get the person to the nearest hospital. For other signs that concern you, call your health care provider. Adverse reactions should be reported to the Vaccine Adverse Event Reporting System (VAERS). Your health care provider will usually file this report, or you can do it yourself. Visit the VAERS website at www.vaers.SamedayNews.es or call 949-185-4941. VAERS is only for reporting reactions, and VAERS staff do not give medical advice. 6.  How can I learn more?  Ask your health care provider.  Call your local or state health department.  Contact the Centers for Disease Control and Prevention  (CDC): ? Call 407-717-9054 (1-800-CDC-INFO) or ? Visit CDC's website at http://hunter.com/ CDC Vaccine Information Statement PPSV23 Vaccine (10/11/2018) This information is not intended to replace advice given to you by your health care provider. Make sure you discuss any questions you have with your health care provider. Document Revised: 03/20/2019 Document Reviewed: 07/11/2018 Elsevier Patient Education  [place].

## 2020-06-05 NOTE — Progress Notes (Signed)
Patient ID: Chad Cantu, male    DOB: Apr 27, 1951  MRN: [id number]  CC: Hypertension   Subjective: Chad Cantu is a 69 y.o. male who presents for chronic ds management.  I last saw him 09/2018. Was seeing a doctor in [place] His concerns today include:  Hx of prediabetes, HTN, tobacco, hyperlipidemia, CTS RT, ETOH use disorder admitted for detox 07/2016 at [place] Currently taking: see medication list.  When I last saw him he was on lisinopril/HCTZ 10/12.5 and Norvasc 5 mg daily.  He reports that he is still taking them. Med Adherence: [x]  Yes    []  No Medication side effects: []  Yes    [x]  No Adherence with salt restriction: [x]  Yes    []  No Home Monitoring?: [x]  Yes    []  No Monitoring Frequency: QOD.  Does not recall # Home BP results range: []  Yes    []  No SOB? []  Yes    [x]  No Chest Pain?: []  Yes    [x]  No Leg swelling?: []  Yes    [x]  No Headaches?: []  Yes    [x]  No Dizziness? []  Yes    [x]  No Comments:    HL:  Still taking Lipitor and tolerating  Tob Dep: 1/3 pk/day.  Ready to quit. Does not want the patches ETOH: drinks 16 oz 2 at a time but not every day.    C/o pain in LT shoulder x 3 mths. Slip and fell while power washing a floor.  Landed on LT side.  Did not start hurting till a few days after.  However pain has become progressively worse to the point that he is unable to elevate the arm at all.  Pain is worse with certain movements and when laying down at night.  He has been taking Excedrin and using an over-the-counter pain patch.  He rates the pain as 10 out of 10.  PreDM:  Compliant with Metformin.  He thinks he is doing okay with his eating habits but states that he eats candy off and on Not much exercise  Patient Active Problem List   Diagnosis Date Noted  . Alcohol use disorder, severe, dependence ([place])   . Memory difficulty 05/02/2017  . Rash, skin 05/02/2017  . Overactive bladder 05/02/2017  . Prediabetes 05/01/2017  . Alcohol  use disorder 05/01/2017  . HLD (hyperlipidemia) 04/09/2015  . Essential hypertension 04/08/2015  . Smoking 04/08/2015     Current Outpatient Medications on File Prior to Visit  Medication Sig Dispense Refill  . gabapentin (NEURONTIN) 300 MG capsule Take 1 capsule (300 mg total) by mouth at bedtime. (Patient not taking: Reported on 01/18/2019) 30 capsule 3   No current facility-administered medications on file prior to visit.    No Known Allergies  Social History   Socioeconomic History  . Marital status: Married    Spouse name: Not on file  . Number of children: Not on file  . Years of education: Not on file  . Highest education level: Not on file  Occupational History  . Not on file  Tobacco Use  . Smoking status: Heavy Tobacco Smoker    Packs/day: 1.00    Types: Cigarettes  . Smokeless tobacco: Never Used  Vaping Use  . Vaping Use: Never used  Substance and Sexual Activity  . 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 Drug use: Yes    Types: Marijuana    Comment: THC and  Crack last usage 4-5 years ago  . Sexual activity: Yes  Other Topics Concern  . Not on file  Social History Narrative  . Not on file   Social Determinants of Health   Financial Resource Strain:   . Difficulty of Paying Living Expenses:   Food Insecurity:   . Worried About Charity fundraiser in the Last Year:   . Arboriculturist in the Last Year:   Transportation Needs:   . Film/video editor (Medical):   Marland Kitchen Lack of Transportation (Non-Medical):   Physical Activity:   . Days of Exercise per Week:   . Minutes of Exercise per Session:   Stress:   . Feeling of Stress :   Social Connections:   . Frequency of Communication with Friends and Family:   . Frequency of Social Gatherings with Friends and Family:   . Attends Religious Services:   . Active Member of Clubs or Organizations:   . Attends Archivist Meetings:   Marland Kitchen Marital Status:   Intimate Partner  Violence:   . Fear of Current or Ex-Partner:   . Emotionally Abused:   Marland Kitchen Physically Abused:   . Sexually Abused:     Family History  Problem Relation Age of Onset  . Diabetes Mother     Past Surgical History:  Procedure Laterality Date  . LAPAROSCOPIC GASTROTOMY W/ REPAIR OF ULCER      ROS: Review of Systems Negative except as stated above  PHYSICAL EXAM: BP (!) 156/94   Pulse 91   Temp 98.2 F (36.8 C)   Resp 16   Ht 5\' 7"  (1.702 m)   Wt 165 lb 9.6 oz (75.1 kg)   SpO2 98%   BMI 25.94 kg/m   Wt Readings from Last 3 Encounters:  06/05/20 165 lb 9.6 oz (75.1 kg)  02/13/19 172 lb 4 oz (78.1 kg)  01/18/19 166 lb 3.2 oz (75.4 kg)    Physical Exam General appearance - alert, well appearing, and in no distress Mental status - normal mood, behavior, speech, dress, motor activity, and thought processes Neck - supple, no significant adenopathy Chest - clear to auscultation, no wheezes, rales or rhonchi, symmetric air entry Heart - normal rate, regular rhythm, normal S1, S2, no murmurs, rubs, clicks or gallops Musculoskeletal -left shoulder: Mild tenderness on palpation anteriorly.  He has moderate discomfort with passive range of motion across the chest and when trying to place behind the back.  He has significantly limited range of motion with attempted passive elevation.  Drop arm test definitely positive. Extremities -no lower extremity edema  CMP Latest Ref Rng & Units 02/13/2019 09/26/2018 06/02/2017  Glucose 70 - 99 mg/dL 96 72 -  BUN 8 - 23 mg/dL 11 12 -  Creatinine 0.61 - 1.24 mg/dL 0.76 0.73(L) -  Sodium 135 - 145 mmol/L 140 139 -  Potassium 3.5 - 5.1 mmol/L 3.8 3.7 -  Chloride 98 - 111 mmol/L 108 97 -  CO2 22 - 32 mmol/L 24 24 -  Calcium 8.9 - 10.3 mg/dL 9.0 10.0 -  Total Protein 6.5 - 8.1 g/dL 8.3(H) 8.0 7.5  Total Bilirubin 0.3 - 1.2 mg/dL 0.3 0.3 0.3  Alkaline Phos 38 - 126 U/L 74 67 65  AST 15 - 41 U/L 32 35 38  ALT 0 - 44 U/L 31 27 37   Lipid Panel       Component Value Date/Time   CHOL 158 09/26/2018 1138   TRIG 189 (  H) 09/26/2018 1138   HDL 48 09/26/2018 1138   CHOLHDL 3.3 09/26/2018 1138   CHOLHDL 6.5 (H) 01/16/2016 1052   VLDL NOT CALC 01/16/2016 1052   LDLCALC 72 09/26/2018 1138    CBC    Component Value Date/Time   WBC 7.0 02/13/2019 0840   RBC 4.73 02/13/2019 0840   HGB 14.7 02/13/2019 0840   HGB 15.9 09/26/2018 1138   HCT 45.4 02/13/2019 0840   HCT 45.0 09/26/2018 1138   PLT 255 02/13/2019 0840   PLT 250 09/26/2018 1138   MCV 96.0 02/13/2019 0840   MCV 90 09/26/2018 1138   MCH 31.1 02/13/2019 0840   MCHC 32.4 02/13/2019 0840   RDW 14.3 02/13/2019 0840   RDW 14.4 09/26/2018 1138   LYMPHSABS 2.4 02/13/2019 0840   MONOABS 0.6 02/13/2019 0840   EOSABS 0.1 02/13/2019 0840   BASOSABS 0.1 02/13/2019 0840    ASSESSMENT AND PLAN: 1. Essential hypertension Not at goal.  Increase Norvasc to 10 mg.  DASH diet discussed and encouraged - CBC - Comprehensive metabolic panel - lisinopril-hydrochlorothiazide (ZESTORETIC) 10-12.5 MG tablet; Take 1 tablet by mouth daily.  Dispense: 90 tablet; Refill: 2 - amLODipine (NORVASC) 10 MG tablet; Take 1 tablet (10 mg total) by mouth daily.  Dispense: 90 tablet; Refill: 3  2. Chronic left shoulder pain Most likely rotator cuff tear.  We will start with plain x-ray.  If negative we will move onto MRI. - DG Shoulder Left; Future  3. Pure hypercholesterolemia - Lipid panel - atorvastatin (LIPITOR) 20 MG tablet; Take 1 tablet (20 mg total) by mouth daily.  Dispense: 90 tablet; Refill: 3  4. Prediabetes Dietary counseling given.  Encouraged him to do some form of moderate intensity exercise with goal of at least 150 minutes/week - Hemoglobin A1c - metFORMIN (GLUCOPHAGE) 500 MG tablet; Take 1 tablet (500 mg total) by mouth daily with breakfast.  Dispense: 90 tablet; Refill: 2  5. Alcohol use disorder, mild, in sustained remission Commended him on what he has done.  However advised him  not to drink more than one 16 ounce beer in the setting.  6. Screening for colon cancer - Ambulatory referral to Gastroenterology  7. Prostate cancer screening PSA was ordered but it appears that this is not covered by his insurance so we will hold off on this for now  8. Tobacco dependence Advised to quit.  Discussed health risks associated with smoking.  Patient ready to give a trial of quitting.  He would like to try Chantix.  I went over possible side effects including mood swings and bad dreams.  Less than 5 minutes spent on counseling. - varenicline (CHANTIX STARTING MONTH PAK) 0.5 MG X 11 & 1 MG X 42 tablet; Take one 0.5 mg tablet by mouth once daily for 3 days, then increase to one 0.5 mg tablet twice daily for 4 days, then increase to one 1 mg tablet twice daily.  Dispense: 53 tablet; Refill: 0  9. Need for vaccination for Strep pneumoniae - Pneumococcal polysaccharide vaccine 23-valent greater than or equal to 2yo subcutaneous/IM  Patient was given the opportunity to ask questions.  Patient verbalized understanding of the plan and was able to repeat key elements of the plan.   Orders Placed This Encounter  Procedures  . DG Shoulder Left  . Pneumococcal polysaccharide vaccine 23-valent greater than or equal to 2yo subcutaneous/IM  . CBC  . Comprehensive metabolic panel  . Lipid panel  . Hemoglobin A1c  . Ambulatory  referral to Gastroenterology     Requested Prescriptions   Signed Prescriptions Disp Refills  . lisinopril-hydrochlorothiazide (ZESTORETIC) 10-12.5 MG tablet 90 tablet 2    Sig: Take 1 tablet by mouth daily.  Marland Kitchen atorvastatin (LIPITOR) 20 MG tablet 90 tablet 3    Sig: Take 1 tablet (20 mg total) by mouth daily.  Marland Kitchen amLODipine (NORVASC) 10 MG tablet 90 tablet 3    Sig: Take 1 tablet (10 mg total) by mouth daily.  . metFORMIN (GLUCOPHAGE) 500 MG tablet 90 tablet 2    Sig: Take 1 tablet (500 mg total) by mouth daily with breakfast.  . varenicline (CHANTIX  STARTING MONTH PAK) 0.5 MG X 11 & 1 MG X 42 tablet 53 tablet 0    Sig: Take one 0.5 mg tablet by mouth once daily for 3 days, then increase to one 0.5 mg tablet twice daily for 4 days, then increase to one 1 mg tablet twice daily.    Return in about 4 months (around 10/05/2020).  Karle Plumber, MD, FACP

## 2020-06-06 ENCOUNTER — Telehealth: Payer: Self-pay | Admitting: Internal Medicine

## 2020-06-06 LAB — HEMOGLOBIN A1C
Est. average glucose Bld gHb Est-mCnc: 120 mg/dL
Hgb A1c MFr Bld: 5.8 % — ABNORMAL HIGH (ref 4.8–5.6)

## 2020-06-06 LAB — LIPID PANEL
Chol/HDL Ratio: 3 ratio (ref 0.0–5.0)
Cholesterol, Total: 132 mg/dL (ref 100–199)
HDL: 44 mg/dL (ref >39)
LDL Chol Calc (NIH): 63 mg/dL (ref 0–99)
Triglycerides: 145 mg/dL (ref 0–149)
VLDL Cholesterol Cal: 25 mg/dL (ref 5–40)

## 2020-06-06 LAB — COMPREHENSIVE METABOLIC PANEL
ALT: 29 IU/L (ref 0–44)
AST: 26 IU/L (ref 0–40)
Albumin/Globulin Ratio: 1.3 (ref 1.2–2.2)
Albumin: 4.2 g/dL (ref 3.8–4.8)
Alkaline Phosphatase: 92 IU/L (ref 48–121)
BUN/Creatinine Ratio: 11 (ref 10–24)
BUN: 7 mg/dL — ABNORMAL LOW (ref 8–27)
Bilirubin Total: <0.2 mg/dL (ref 0.0–1.2)
CO2: 24 mmol/L (ref 20–29)
Calcium: 9.2 mg/dL (ref 8.6–10.2)
Chloride: 104 mmol/L (ref 96–106)
Creatinine, Ser: 0.61 mg/dL — ABNORMAL LOW (ref 0.76–1.27)
GFR calc Af Amer: 119 mL/min/{1.73_m2} (ref >59)
GFR calc non Af Amer: 103 mL/min/{1.73_m2} (ref >59)
Globulin, Total: 3.3 g/dL (ref 1.5–4.5)
Glucose: 89 mg/dL (ref 65–99)
Potassium: 4 mmol/L (ref 3.5–5.2)
Sodium: 141 mmol/L (ref 134–144)
Total Protein: 7.5 g/dL (ref 6.0–8.5)

## 2020-06-06 LAB — CBC
Hematocrit: 40.9 % (ref 37.5–51.0)
Hemoglobin: 14 g/dL (ref 13.0–17.7)
MCH: 32 pg (ref 26.6–33.0)
MCHC: 34.2 g/dL (ref 31.5–35.7)
MCV: 93 fL (ref 79–97)
Platelets: 284 10*3/uL (ref 150–450)
RBC: 4.38 x10E6/uL (ref 4.14–5.80)
RDW: 12.1 % (ref 11.6–15.4)
WBC: 10.3 10*3/uL (ref 3.4–10.8)

## 2020-06-06 MED ORDER — CYCLOBENZAPRINE HCL 5 MG PO TABS
5.0000 mg | ORAL_TABLET | Freq: Every day | ORAL | 1 refills | Status: DC | PRN
Start: 2020-06-06 — End: 2020-11-16

## 2020-06-06 MED FILL — CYCLOBENZAPRINE 5 MG TABLET: 5 | 30 days supply | Qty: 30 | Fill #0

## 2020-06-06 NOTE — Telephone Encounter (Signed)
Patient came into the office today and requested for the muscle relaxer's that were talked about in recent OV. Please follow up at your earliest convenience.

## 2020-06-06 NOTE — Progress Notes (Signed)
Let patient know that his blood count is normal meaning no anemia. Kidney and liver function tests normal. Cholesterol level normal. He is still in the range for prediabetes. Healthy eating habits and regular exercise will help prevent progression to full diabetes.

## 2020-06-06 NOTE — Telephone Encounter (Signed)
Will forward to pcp

## 2020-06-12 ENCOUNTER — Telehealth: Payer: Self-pay | Admitting: Internal Medicine

## 2020-06-12 NOTE — Telephone Encounter (Signed)
Will forward to pcp

## 2020-06-12 NOTE — Telephone Encounter (Signed)
Please Advise

## 2020-06-12 NOTE — Telephone Encounter (Signed)
Pt had a medication prescribed from an alcohol anonymous facility. Pt wants to know if Dr. Wynetta Emery will continue to fill this Rx for him / the medication is Naltrexone Hydrochloride / please advise

## 2020-06-17 NOTE — Telephone Encounter (Signed)
Pt is schedule for a virtual visit with pcp on 7/9

## 2020-06-18 ENCOUNTER — Telehealth: Payer: Self-pay

## 2020-06-18 NOTE — Telephone Encounter (Signed)
Contacted pt to go over lab results pt was unavailable spoke with pt wife and provided lab results. Pt wife doesn't have any questions or concerns

## 2020-06-20 ENCOUNTER — Other Ambulatory Visit: Payer: Self-pay

## 2020-06-20 ENCOUNTER — Ambulatory Visit: Payer: Medicare Other | Attending: Internal Medicine | Admitting: Internal Medicine

## 2020-06-20 DIAGNOSIS — F1020 Alcohol dependence, uncomplicated: Secondary | ICD-10-CM

## 2020-06-20 MED ORDER — NALTREXONE HCL 50 MG PO TABS: 50.0000 mg | ORAL_TABLET | Freq: Every day | ORAL | 0 refills | Status: DC

## 2020-06-20 MED FILL — NALTREXONE 50 MG TABLET: 50 | 30 days supply | Qty: 30 | Fill #0

## 2020-06-20 NOTE — Progress Notes (Signed)
Pt is requesting a refill on Naltrexone Hydrochloride 50MG 

## 2020-06-20 NOTE — Progress Notes (Signed)
Virtual Visit via Telephone Note Due to current restrictions/limitations of in-office visits due to the COVID-19 pandemic, this scheduled clinical appointment was converted to a telehealth visit  I connected with Chad Cantu on 06/20/20 at 11:14 a.m by telephone and verified that I am speaking with the correct person using two identifiers. I am in my office.  The patient is at home.  Only the patient and myself participated in this encounter.  I discussed the limitations, risks, security and privacy concerns of performing an evaluation and management service by telephone and the availability of in person appointments. I also discussed with the patient that there may be a patient responsible charge related to this service. The patient expressed understanding and agreed to proceed.   History of Present Illness: Hx of prediabetes, HTN, tob dep, HL, CTS RT, ETOH use disorder admitted for detox 07/2016 at [facility].   Pt requesting rxn for naltrexone.  Patient with history of EtOH use disorder.  Patient states he was doing good until recently when he started drinking heavily again after our last visit.  He still had some naltrexone left over that was given to him in the past through a detox program so he started taking it and it has decreased his cravings to the extent that he is no longer drinking.  However he would like some more of the naltrexone to keep the cravings down.  He used to go to [employer] and [employer] but states that they have shut down since the Covid pandemic.  He is not on any narcotic medications by prescription or from purchase on the street Observations/Objective: Results for orders placed or performed in visit on 06/05/20  CBC  Result Value Ref Range   WBC 10.3 3.4 - 10.8 x10E3/uL   RBC 4.38 4.14 - 5.80 x10E6/uL   Hemoglobin 14.0 13.0 - 17.7 g/dL   Hematocrit 40.9 37.5 - 51.0 %   MCV 93 79 - 97 fL   MCH 32.0 26.6 - 33.0 pg   MCHC 34.2 31 - 35 g/dL   RDW 12.1 11.6 - 15.4 %    Platelets 284 150 - 450 x10E3/uL  Comprehensive metabolic panel  Result Value Ref Range   Glucose 89 65 - 99 mg/dL   BUN 7 (L) 8 - 27 mg/dL   Creatinine, Ser 0.61 (L) 0.76 - 1.27 mg/dL   GFR calc non Af Amer 103 >59 mL/min/1.73   GFR calc Af Amer 119 >59 mL/min/1.73   BUN/Creatinine Ratio 11 10 - 24   Sodium 141 134 - 144 mmol/L   Potassium 4.0 3.5 - 5.2 mmol/L   Chloride 104 96 - 106 mmol/L   CO2 24 20 - 29 mmol/L   Calcium 9.2 8.6 - 10.2 mg/dL   Total Protein 7.5 6.0 - 8.5 g/dL   Albumin 4.2 3.8 - 4.8 g/dL   Globulin, Total 3.3 1.5 - 4.5 g/dL   Albumin/Globulin Ratio 1.3 1.2 - 2.2   Bilirubin Total <0.2 0.0 - 1.2 mg/dL   Alkaline Phosphatase 92 48 - 121 IU/L   AST 26 0 - 40 IU/L   ALT 29 0 - 44 IU/L  Lipid panel  Result Value Ref Range   Cholesterol, Total 132 100 - 199 mg/dL   Triglycerides 145 0 - 149 mg/dL   HDL 44 >39 mg/dL   VLDL Cholesterol Cal 25 5 - 40 mg/dL   LDL Chol Calc (NIH) 63 0 - 99 mg/dL   Chol/HDL Ratio 3.0 0.0 - 5.0 ratio  Hemoglobin A1c  Result Value Ref Range   Hgb A1c MFr Bld 5.8 (H) 4.8 - 5.6 %   Est. average glucose Bld gHb Est-mCnc 120 mg/dL     Assessment and Plan: 1. Alcohol use disorder, severe, dependence ([place]) Commended patient on making a step to prevent himself from going down the slippery slope of severe alcohol abuse again.  However I advised that he discontinue drinking completely.  He is not on any narcotic medications.  I told him that if he is taking naltrexone can cause severe withdrawal symptoms.  I think it is reasonable to put him on naltrexone for 1 to 2 months to help decrease the cravings as he tries to maintain his sobriety.  Last LFTs were normal.  I will also have our LCSW follow-up with him to see if there is any AA meetings that are currently being held in person or online.  He reports that he was doing AA and NA regularly up until the time of the Covid pandemic. - naltrexone (DEPADE) 50 MG tablet; Take 1 tablet (50 mg total)  by mouth daily.  Dispense: 30 tablet; Refill: 0   Follow Up Instructions: Keep follow-up visit   I discussed the assessment and treatment plan with the patient. The patient was provided an opportunity to ask questions and all were answered. The patient agreed with the plan and demonstrated an understanding of the instructions.   The patient was advised to call back or seek an in-person evaluation if the symptoms worsen or if the condition fails to improve as anticipated.  I provided 6 minutes of non-face-to-face time during this encounter.   Karle Plumber, MD

## 2020-06-27 ENCOUNTER — Telehealth: Payer: Self-pay | Admitting: Licensed Clinical Social Worker

## 2020-06-27 NOTE — Telephone Encounter (Signed)
Call placed to patient to provide supportive resources regarding alcohol use. Pt shared that he knows where to go for AA meetings. LCSW informed him of additional virtual meetings. Pt was appreciative and denied any other behavioral health or resource needs.

## 2020-07-03 ENCOUNTER — Encounter: Payer: Self-pay | Admitting: Internal Medicine

## 2020-08-27 ENCOUNTER — Telehealth: Payer: Self-pay | Admitting: *Deleted

## 2020-08-27 ENCOUNTER — Ambulatory Visit (AMBULATORY_SURGERY_CENTER): Payer: Self-pay | Admitting: *Deleted

## 2020-08-27 ENCOUNTER — Other Ambulatory Visit: Payer: Self-pay

## 2020-08-27 VITALS — Ht 67.0 in | Wt 165.0 lb

## 2020-08-27 DIAGNOSIS — Z1211 Encounter for screening for malignant neoplasm of colon: Secondary | ICD-10-CM

## 2020-08-27 MED ORDER — NA SULFATE-K SULFATE-MG SULF 17.5-3.13-1.6 GM/177ML PO SOLN
ORAL | 0 refills | Status: DC
Start: 2020-08-27 — End: 2020-09-11

## 2020-08-27 MED FILL — SUPREP BOWEL PREP KIT: 17.5-3.13-1 | 1 days supply | Qty: 354 | Fill #0

## 2020-08-27 NOTE — Telephone Encounter (Signed)
Patient had his PV today. Direct screening Colonoscopy scheduled with Dr.Mansouraty on 09/11/2020. He states his last colonoscopy was about 5 years ago he thinks at [place]. He is unsure results of that colon. Release of information form signed by patient and faxed to [facility] and given to Mc[place]od, CMA.

## 2020-08-27 NOTE — Progress Notes (Signed)
Patient and wife is here in-person for PV. Patient denies any allergies to eggs or soy. Patient denies any problems with anesthesia/sedation. Patient denies any oxygen use at home. Patient denies taking any diet/weight loss medications or blood thinners. Patient is not being treated for MRSA or C-diff. Patient is aware of our care-partner policy and BBWNJ-54 safety protocol. EMMI education assisgned to the patient for the procedure, sent to MyChart.   COVID-19 vaccines completed on 02/07/20, per patient.   Release of information signed by patient.

## 2020-09-09 ENCOUNTER — Encounter: Payer: Medicare Other | Admitting: Internal Medicine

## 2020-09-11 ENCOUNTER — Other Ambulatory Visit: Payer: Self-pay

## 2020-09-11 ENCOUNTER — Ambulatory Visit (AMBULATORY_SURGERY_CENTER): Payer: Medicare Other | Admitting: Gastroenterology

## 2020-09-11 ENCOUNTER — Encounter: Payer: Self-pay | Admitting: Gastroenterology

## 2020-09-11 VITALS — BP 131/89 | HR 84 | Temp 97.8°F | Resp 19 | Ht 67.0 in | Wt 165.0 lb

## 2020-09-11 DIAGNOSIS — Z1211 Encounter for screening for malignant neoplasm of colon: Secondary | ICD-10-CM | POA: Diagnosis not present

## 2020-09-11 DIAGNOSIS — D127 Benign neoplasm of rectosigmoid junction: Secondary | ICD-10-CM | POA: Diagnosis not present

## 2020-09-11 DIAGNOSIS — D125 Benign neoplasm of sigmoid colon: Secondary | ICD-10-CM

## 2020-09-11 MED ORDER — SODIUM CHLORIDE 0.9 % IV SOLN: 500.0000 mL | Freq: Once | INTRAVENOUS | Status: DC

## 2020-09-11 NOTE — Patient Instructions (Signed)
Discharge instructions given. Handouts on polyps,diverticulosis and hemorrhoids. High fiber diet. Use Fibercon 1-2 tablets po daily. Resume previous medications. YOU HAD AN ENDOSCOPIC PROCEDURE TODAY AT [facility]:   Refer to the procedure report that was given to you for any specific questions about what was found during the examination.  If the procedure report does not answer your questions, please call your gastroenterologist to clarify.  If you requested that your care partner not be given the details of your procedure findings, then the procedure report has been included in a sealed envelope for you to review at your convenience later.  YOU SHOULD EXPECT: Some feelings of bloating in the abdomen. Passage of more gas than usual.  Walking can help get rid of the air that was put into your GI tract during the procedure and reduce the bloating. If you had a lower endoscopy (such as a colonoscopy or flexible sigmoidoscopy) you may notice spotting of blood in your stool or on the toilet paper. If you underwent a bowel prep for your procedure, you may not have a normal bowel movement for a few days.  Please Note:  You might notice some irritation and congestion in your nose or some drainage.  This is from the oxygen used during your procedure.  There is no need for concern and it should clear up in a day or so.  SYMPTOMS TO REPORT IMMEDIATELY:   Following lower endoscopy (colonoscopy or flexible sigmoidoscopy):  Excessive amounts of blood in the stool  Significant tenderness or worsening of abdominal pains  Swelling of the abdomen that is new, acute  Fever of 100F or higher   For urgent or emergent issues, a gastroenterologist can be reached at any hour by calling [phone]. Do not use MyChart messaging for urgent concerns.    DIET:  We do recommend a small meal at first, but then you may proceed to your regular diet.  Drink plenty of fluids but you should avoid  alcoholic beverages for 24 hours.  ACTIVITY:  You should plan to take it easy for the rest of today and you should NOT DRIVE or use heavy machinery until tomorrow (because of the sedation medicines used during the test).    FOLLOW UP: Our staff will call the number listed on your records 48-72 hours following your procedure to check on you and address any questions or concerns that you may have regarding the information given to you following your procedure. If we do not reach you, we will leave a message.  We will attempt to reach you two times.  During this call, we will ask if you have developed any symptoms of COVID 19. If you develop any symptoms (ie: fever, flu-like symptoms, shortness of breath, cough etc.) before then, please call [phone].  If you test positive for Covid 19 in the 2 weeks post procedure, please call and report this information to [place].    If any biopsies were taken you will be contacted by phone or by letter within the next 1-3 weeks.  Please call us at [phone] if you have not heard about the biopsies in 3 weeks.    SIGNATURES/CONFIDENTIALITY: You and/or your care partner have signed paperwork which will be entered into your electronic medical record.  These signatures attest to the fact that that the information above on your After Visit Summary has been reviewed and is understood.  Full responsibility of the confidentiality of this discharge information lies with you and/or  your care-partner.

## 2020-09-11 NOTE — Progress Notes (Signed)
VS taken by C.W. 

## 2020-09-11 NOTE — Progress Notes (Signed)
Report given to PACU, vss 

## 2020-09-11 NOTE — Op Note (Signed)
[place] Patient Name: Chad Cantu Procedure Date: 09/11/2020 11:22 AM MRN: [id number] Endoscopist: Justice Britain , MD Age: 69 Referring MD:  Date of Birth: 1951-04-12 Gender: Male Account #: [id number] Procedure:                Colonoscopy Indications:              Screening for colorectal malignant neoplasm, This                            is the patient's first colonoscopy Medicines:                Monitored Anesthesia Care Procedure:                Pre-Anesthesia Assessment:                           - Prior to the procedure, a History and Physical                            was performed, and patient medications and                            allergies were reviewed. The patient's tolerance of                            previous anesthesia was also reviewed. The risks                            and benefits of the procedure and the sedation                            options and risks were discussed with the patient.                            All questions were answered, and informed consent                            was obtained. Prior Anticoagulants: The patient has                            taken no previous anticoagulant or antiplatelet                            agents. ASA Grade Assessment: II - A patient with                            mild systemic disease. After reviewing the risks                            and benefits, the patient was deemed in                            satisfactory condition to undergo the procedure.  After obtaining informed consent, the colonoscope                            was passed under direct vision. Throughout the                            procedure, the patient's blood pressure, pulse, and                            oxygen saturations were monitored continuously. The                            Colonoscope was introduced through the anus and                            advanced to the 5 cm  into the ileum. The                            colonoscopy was performed without difficulty. The                            patient tolerated the procedure. The quality of the                            bowel preparation was adequate. The terminal ileum,                            ileocecal valve, appendiceal orifice, and rectum                            were photographed. Scope In: 11:41:41 AM Scope Out: 12:03:20 PM Scope Withdrawal Time: 0 hours 18 minutes 33 seconds  Total Procedure Duration: 0 hours 21 minutes 39 seconds  Findings:                 The digital rectal exam findings include                            hemorrhoids. Pertinent negatives include no                            palpable rectal lesions.                           The terminal ileum and ileocecal valve appeared                            normal.                           Five sessile polyps were found in the recto-sigmoid                            colon (2) and sigmoid colon (3). The polyps were 2  to 3 mm in size. These polyps were removed with a                            cold snare. Resection and retrieval were complete.                           Multiple small-mouthed diverticula were found in                            the entire colon.                           Normal mucosa was found in the entire colon                            otherwise.                           Non-bleeding non-thrombosed external and internal                            hemorrhoids were found during retroflexion, during                            perianal exam and during digital exam. The                            hemorrhoids were Grade II (internal hemorrhoids                            that prolapse but reduce spontaneously). Complications:            No immediate complications. Estimated Blood Loss:     Estimated blood loss was minimal. Impression:               - Hemorrhoids found on digital rectal  exam.                           - The examined portion of the ileum was normal.                           - Five 2 to 3 mm polyps at the recto-sigmoid colon                            and in the sigmoid colon, removed with a cold                            snare. Resected and retrieved.                           - Diverticulosis in the entire examined colon.                           - Normal mucosa in the entire examined colon.                           -  Non-bleeding non-thrombosed external and internal                            hemorrhoids. Recommendation:           - The patient will be observed post-procedure,                            until all discharge criteria are met.                           - Discharge patient to home.                           - Patient has a contact number available for                            emergencies. The signs and symptoms of potential                            delayed complications were discussed with the                            patient. Return to normal activities tomorrow.                            Written discharge instructions were provided to the                            patient.                           - High fiber diet.                           - Use FiberCon 1-2 tablets PO daily.                           - Continue present medications.                           - Await pathology results.                           - Repeat colonoscopy in 3/04/18/09 years for                            surveillance based on pathology results and                            findings of adenomatous tissue.                           - The findings and recommendations were discussed                            with the patient. Justice Britain, MD 09/11/2020 12:09:28 PM

## 2020-09-11 NOTE — Progress Notes (Signed)
Pt's states no medical or surgical changes since previsit or office visit. 

## 2020-09-11 NOTE — Progress Notes (Signed)
Called to room to assist during endoscopic procedure.  Patient ID and intended procedure confirmed with present staff. Received instructions for my participation in the procedure from the performing physician.  

## 2020-09-15 ENCOUNTER — Telehealth: Payer: Self-pay | Admitting: *Deleted

## 2020-09-15 NOTE — Telephone Encounter (Signed)
  Follow up Call-  Call back number 09/11/2020  Post procedure Call Back phone  # [phone]  Permission to leave phone message Yes  Some recent data might be hidden     Patient questions:  Do you have a fever, pain , or abdominal swelling? No. Pain Score  0 *  Have you tolerated food without any problems? Yes.    Have you been able to return to your normal activities? Yes.    Do you have any questions about your discharge instructions: Diet   No. Medications  No. Follow up visit  No.  Do you have questions or concerns about your Care? No.  Actions: * If pain score is 4 or above: No action needed, pain <4.  1. Have you developed a fever since your procedure? no  2.   Have you had an respiratory symptoms (SOB or cough) since your procedure? no  3.   Have you tested positive for COVID 19 since your procedure no  4.   Have you had any family members/close contacts diagnosed with the COVID 19 since your procedure?  no   If yes to any of these questions please route to Joylene John, RN and Joella Prince, RN'

## 2020-09-18 ENCOUNTER — Encounter: Payer: Self-pay | Admitting: Gastroenterology

## 2020-09-20 ENCOUNTER — Other Ambulatory Visit: Payer: Self-pay

## 2020-09-20 ENCOUNTER — Emergency Department (HOSPITAL_COMMUNITY)
Admission: EM | Admit: 2020-09-20 | Discharge: 2020-09-20 | Disposition: A | Discharge status: Home / Self Care | Payer: Medicare Other | Attending: Emergency Medicine | Admitting: Emergency Medicine

## 2020-09-20 DIAGNOSIS — I10 Essential (primary) hypertension: Secondary | ICD-10-CM | POA: Insufficient documentation

## 2020-09-20 DIAGNOSIS — Z79899 Other long term (current) drug therapy: Secondary | ICD-10-CM | POA: Diagnosis not present

## 2020-09-20 DIAGNOSIS — E119 Type 2 diabetes mellitus without complications: Secondary | ICD-10-CM | POA: Diagnosis not present

## 2020-09-20 DIAGNOSIS — F1010 Alcohol abuse, uncomplicated: Secondary | ICD-10-CM | POA: Diagnosis not present

## 2020-09-20 DIAGNOSIS — Z7984 Long term (current) use of oral hypoglycemic drugs: Secondary | ICD-10-CM | POA: Insufficient documentation

## 2020-09-20 DIAGNOSIS — F17210 Nicotine dependence, cigarettes, uncomplicated: Secondary | ICD-10-CM | POA: Diagnosis not present

## 2020-09-20 DIAGNOSIS — R55 Syncope and collapse: Secondary | ICD-10-CM | POA: Diagnosis present

## 2020-09-20 DIAGNOSIS — R4182 Altered mental status, unspecified: Secondary | ICD-10-CM | POA: Diagnosis present

## 2020-09-20 DIAGNOSIS — E860 Dehydration: Secondary | ICD-10-CM | POA: Diagnosis not present

## 2020-09-20 LAB — BASIC METABOLIC PANEL
Anion gap: 15 (ref 5–15)
BUN: 8 mg/dL (ref 8–23)
CO2: 23 mmol/L (ref 22–32)
Calcium: 9.2 mg/dL (ref 8.9–10.3)
Chloride: 98 mmol/L (ref 98–111)
Creatinine, Ser: 1.17 mg/dL (ref 0.61–1.24)
GFR, Estimated: >60 mL/min (ref >60)
Glucose, Bld: 106 mg/dL — ABNORMAL HIGH (ref 70–99)
Potassium: 3.6 mmol/L (ref 3.5–5.1)
Sodium: 136 mmol/L (ref 135–145)

## 2020-09-20 LAB — CBC
HCT: 39.8 % (ref 39.0–52.0)
Hemoglobin: 12.4 g/dL — ABNORMAL LOW (ref 13.0–17.0)
MCH: 27.5 pg (ref 26.0–34.0)
MCHC: 31.2 g/dL (ref 30.0–36.0)
MCV: 88.2 fL (ref 80.0–100.0)
Platelets: 338 10*3/uL (ref 150–400)
RBC: 4.51 MIL/uL (ref 4.22–5.81)
RDW: 14.2 % (ref 11.5–15.5)
WBC: 12.2 10*3/uL — ABNORMAL HIGH (ref 4.0–10.5)
nRBC: 0 % (ref 0.0–0.2)

## 2020-09-20 LAB — URINALYSIS, ROUTINE W REFLEX MICROSCOPIC
Bilirubin Urine: NEGATIVE
Glucose, UA: NEGATIVE mg/dL
Hgb urine dipstick: NEGATIVE
Ketones, ur: NEGATIVE mg/dL
Leukocytes,Ua: NEGATIVE
Nitrite: NEGATIVE
Protein, ur: NEGATIVE mg/dL
Specific Gravity, Urine: 1.005 (ref 1.005–1.030)
pH: 6 (ref 5.0–8.0)

## 2020-09-20 LAB — ETHANOL: Alcohol, Ethyl (B): 19 mg/dL — ABNORMAL HIGH (ref <10)

## 2020-09-20 MED ORDER — THIAMINE HCL 100 MG/ML IJ SOLN: 100.0000 mg | Freq: Every day | INTRAMUSCULAR | Status: DC

## 2020-09-20 MED ORDER — SODIUM CHLORIDE 0.9 % IV BOLUS
1000.0000 mL | Freq: Once | INTRAVENOUS | Status: AC
  Administered 2020-09-20: 1000 mL via INTRAVENOUS

## 2020-09-20 MED ORDER — LORAZEPAM 1 MG PO TABS: 0.0000 mg | ORAL_TABLET | Freq: Four times a day (QID) | ORAL | Status: DC

## 2020-09-20 MED ORDER — THIAMINE HCL 100 MG PO TABS
100.0000 mg | ORAL_TABLET | Freq: Every day | ORAL | Status: DC
  Administered 2020-09-20: 100 mg via ORAL
  Filled 2020-09-20: qty 1

## 2020-09-20 MED ORDER — LORAZEPAM 2 MG/ML IJ SOLN
0.0000 mg | Freq: Four times a day (QID) | INTRAMUSCULAR | Status: DC
  Administered 2020-09-20: 1 mg via INTRAVENOUS
  Filled 2020-09-20: qty 1

## 2020-09-20 NOTE — ED Provider Notes (Signed)
[facility] EMERGENCY DEPARTMENT Provider Note   CSN: [id number] Arrival date & time: 09/20/20  1500     History Chief Complaint  Patient presents with   Loss of Consciousness    Chad Cantu is a 69 y.o. male with past medical history of alcohol abuse, prediabetes, hypertension who presents to the ED for an episode of altered mental status.  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  After that he proceed to sit down.  Denies loss of consciousness, head injury or extremity injury.  He also denies shortness of breath, chest pain, headache, palpitation, weakness, abdominal pain.  Blood pressure was checked by EMS at that time and 80/62 sitting and 60/40 while standing.  Patient states that he was outside but the weather was cool.  He did not eat anything today and states that his first meal is usually later in the afternoon when he is working.  He only drinks 1 bottle of water and about 4-6 cans of beer this morning.  Patient has had similar episodes in the past which he contributes to heat.    Patient also states that he has not been taking his blood pressure medications in the last few day and just started back today.  Patient reportedly drinking about 10 cans of beers throughout the day and smokes around 1 pack a day for many years.  He denies drug use.  Patient states that he was prescribed a medication to help with smoking cessation but it was in backorder, chart review showed that it was Chantix.  Patient had multiple ED visits for alcohol abuse  HPI     Past Medical History:  Diagnosis Date   Diabetes mellitus without complication ([place])    ETOH abuse    Hyperlipidemia    Hypertension    Substance abuse ([place])    ETOH, cocaine.  Remission    Patient Active Problem List   Diagnosis Date Noted   Near syncope 09/20/2020   Dehydration 09/20/2020   Alcohol abuse 09/20/2020    Alcohol use disorder, severe, dependence ([place])    Memory difficulty 05/02/2017   Rash, skin 05/02/2017   Overactive bladder 05/02/2017   Prediabetes 05/01/2017   Alcohol use disorder 05/01/2017   HLD (hyperlipidemia) 04/09/2015   Essential hypertension 04/08/2015   Smoking 04/08/2015    Past Surgical History:  Procedure Laterality Date   COLONOSCOPY  5 years ago?   at [facility] maybe per pt,?unsure results   LAPAROSCOPIC GASTROTOMY W/ REPAIR OF ULCER         Family History  Problem Relation Age of Onset   Diabetes Mother    Colon cancer Neg Hx    Colon polyps Neg Hx    Esophageal cancer Neg Hx    Rectal cancer Neg Hx    Stomach cancer Neg Hx     Social History   Tobacco Use   Smoking status: Current Every Day Smoker    Packs/day: 1.00    Types: Cigarettes   Smokeless tobacco: Never Used  Vaping Use   Vaping Use: Never used  Substance Use Topics   Alcohol use: Yes    Alcohol/week: 12.0 standard drinks    Types: 12 Cans of beer per week   Drug use: Not Currently    Types: Marijuana    Comment: THC and Crack last usage 4-5 years ago    Home Medications Prior to Admission medications  Medication Sig Start Date End Date Taking? Authorizing Provider  amLODipine (NORVASC) 10 MG tablet Take 1 tablet (10 mg total) by mouth daily. 06/05/20   [name], MD  Ascorbic Acid (VITAMIN C PO) Take by mouth.    [provider]  atorvastatin (LIPITOR) 20 MG tablet Take 1 tablet (20 mg total) by mouth daily. 06/05/20   [name], MD  Cholecalciferol (VITAMIN D3 PO) Take by mouth.    [provider]  cyclobenzaprine (FLEXERIL) 5 MG tablet Take 1 tablet (5 mg total) by mouth daily as needed for muscle spasms. 06/06/20   [name], MD  metFORMIN (GLUCOPHAGE) 500 MG tablet Take 1 tablet (500 mg total) by mouth daily with breakfast. 06/05/20   [name], MD  varenicline (CHANTIX STARTING MONTH PAK) 0.5 MG X 11 & 1 MG X  42 tablet Take one 0.5 mg tablet by mouth once daily for 3 days, then increase to one 0.5 mg tablet twice daily for 4 days, then increase to one 1 mg tablet twice daily. Patient not taking: Reported on 08/27/2020 06/05/20   [name], MD  zinc gluconate 50 MG tablet Take 50 mg by mouth daily.    [provider]  lisinopril (ZESTRIL) 20 MG tablet Take 20 mg by mouth daily. 03/26/20 09/20/20  [provider]    Allergies    Patient has no known allergies.  Review of Systems   Review of Systems  Constitutional: Positive for diaphoresis.  Respiratory: Negative for chest tightness and shortness of breath.   Cardiovascular: Negative for chest pain and palpitations.  Gastrointestinal: Negative for abdominal pain.  Neurological: Positive for tremors and light-headedness. Negative for weakness and headaches.    Physical Exam Updated Vital Signs BP (!) 151/102    Pulse (!) 104    Temp 99.4 F (37.4 C)    Resp 17    Ht 5\' 7"  (1.702 m)    Wt 74.8 kg    SpO2 99%    BMI 25.84 kg/m   Physical Exam Constitutional:      General: He is not in acute distress.    Appearance: Normal appearance.  HENT:     Head: Normocephalic.  Eyes:     General:        Right eye: No discharge.        Left eye: No discharge.     Pupils: Pupils are equal, round, and reactive to light.  Cardiovascular:     Rate and Rhythm: Normal rate and regular rhythm.     Heart sounds: No murmur heard.   Pulmonary:     Effort: No respiratory distress.     Breath sounds: Normal breath sounds. No wheezing.  Abdominal:     General: Bowel sounds are normal.     Palpations: Abdomen is soft.  Musculoskeletal:        General: No tenderness. Normal range of motion.     Cervical back: Normal range of motion.     Right lower leg: No edema.     Left lower leg: No edema.     Comments: No hip tenderness to palpation Normal range of motion of upper and lower extremities  Skin:    General: Skin is warm.    Neurological:     Mental Status: He is alert.     Comments: PERRLA Cranial nerve no deficit Normal strength and sensation upper and lower extremities Mild tremors noted with outstretched hands  Psychiatric:  Mood and Affect: Mood normal.      ED Results / Procedures / Treatments   Labs (all labs ordered are listed, but only abnormal results are displayed) Labs Reviewed  BASIC METABOLIC PANEL - Abnormal; Notable for the following components:      Result Value   Glucose, Bld 106 (*)    All other components within normal limits  CBC - Abnormal; Notable for the following components:   WBC 12.2 (*)    Hemoglobin 12.4 (*)    All other components within normal limits  ETHANOL - Abnormal; Notable for the following components:   Alcohol, Ethyl (B) 19 (*)    All other components within normal limits  URINALYSIS, ROUTINE W REFLEX MICROSCOPIC  CBG MONITORING, ED    EKG EKG Interpretation  Date/Time:  Saturday September 20 2020 15:00:55 EDT Ventricular Rate:  100 PR Interval:  156 QRS Duration: 80 QT Interval:  348 QTC Calculation: 448 R Axis:   10 Text Interpretation: Normal sinus rhythm Nonspecific ST and T wave abnormality Abnormal ECG No old tracing to compare Confirmed by Delora Fuel ([id number]) on 09/20/2020 3:29:10 PM   Radiology No results found.  Procedures Procedures (including critical care time)  Medications Ordered in ED Medications  LORazepam (ATIVAN) injection 0-4 mg (1 mg Intravenous Given 09/20/20 1646)    Or  LORazepam (ATIVAN) tablet 0-4 mg ( Oral See Alternative 09/20/20 1646)  thiamine tablet 100 mg (100 mg Oral Given 09/20/20 1650)    Or  thiamine (B-1) injection 100 mg ( Intravenous See Alternative 09/20/20 1650)  sodium chloride 0.9 % bolus 1,000 mL (0 mLs Intravenous Stopped 09/20/20 1813)    ED Course  I have reviewed the triage vital signs and the nursing notes.  Pertinent labs & imaging results that were available during my care of the  patient were reviewed by me and considered in my medical decision making (see chart for details).  Patient seen and examined.  He is comfortable and in no acute distress.  Patient denies chest pain, shortness of breath, headache, palpitation, weakness, abdominal pain.  Blood pressure rechecked in the room 150/90.  His lightheadedness is a multifactorial issue which include dehydration, hypotension due to restarting BP meds, and alcohol use.  Low suspicion for arrhythmia or CVA.  CBC shows leukocytosis of 12.2.  BMP was unremarkable with blood glucose of 106.  EKG shows normal sinus rhythm with nonspecific ST elevation at V1 V2.  Will check orthostatic vital sign and give 1 L bolus of normal saline.  Also check ethanol level and started CIWA with Ativan.  BP 125/81    Pulse 97    Temp 98.6 F (37 C) (Oral)    Resp 18    Ht 5\' 7"  (1.702 m)    Wt 74.8 kg    SpO2 100%    BMI 25.84 kg/m   Recheck orthostatic vital sign sitting 147/100 and standing 140/99.  Also had a conversation about alcohol and smoking cessation.  Patient is more motivated to quit and will ask his PCP for resources.  Patient is stable for discharge  Plan: We will hold lisinopril and continue amlodipine only.  Advised patient to continue drinking plenty of water and eat his meals on time.  Cut back on alcohol and smoking.  Come back to ED for worsening symptoms.  Patient agrees with the plan.   MDM Rules/Calculators/A&P  Patient presented to the ED for an episode of lightheadedness.  Patient was at work and standing when episode started then proceeded to sitting down.  No loss of consciousness or head injury or other injuries.  This episode is likely due to multiple factors including dehydration, hypotension due to resolving blood pressure meds today, and alcohol use.  Low suspicion for arrhythmia or CVA.  CBC and BMP were unremarkable.  EKG did not show any arrhythmia.  1 L of normal saline was given.  Recheck  orthostatic vital sign sitting 144/100 and standing 144/99.  Patient is stable for discharge.  Conversation about smoking alcohol cessation and patient will reach out to his PCP for resources.  Hold lisinopril due to hypotensive episode.  Continue amlodipine and other medications.  Come back to the ED for worsening symptoms.    Final Clinical Impression(s) / ED Diagnoses Final diagnoses:  Near syncope  Dehydration  Alcohol abuse    Rx / DC Orders ED Discharge Orders    None       Gaylan Gerold, DO 09/20/20 1934    Drenda Freeze, MD 09/20/20 2032

## 2020-09-20 NOTE — ED Notes (Signed)
Pt ambulatory out of ED, discharged from ED in  NA[facility], no questions at this time

## 2020-09-20 NOTE — ED Triage Notes (Signed)
Pt felt nauseas and lightheaded today at work, had syncopal episode while in the process of sitting down. No fall, no injury, no seizure activity. Pt's bp 80/62, dropped to 60/40 standing. 500 NS bolus given PTA.

## 2020-09-20 NOTE — Discharge Instructions (Addendum)
Chad Cantu, it is a pleasure getting to know you today.  You came to the ED for an episode of lightheadedness.  This is likely due to dehydration, hypotension for restarting your blood pressure medications, and alcohol use.  Your blood work came back unremarkable.  Your serum alcohol level is mildly elevated and you are not having an episode of alcohol withdrawal.  Please try to eat your meals on time and drink plenty of water.  Please follow-up with your PCP as soon as possible.  We have talked about alcohol abuse, Wernicke and Korsakoff, please cut back on your drinking slowly.  Also contact your primary physician for prescription of nicotine patches.  Please Stop taking lisinopril.  Please only take Amlodipine 10 mg once a day.  Continue other medication as instructed.  Please come back to the ED for worsening symptoms or new neurological symptoms.  Take care  Dr. Alfonse Spruce

## 2020-10-06 ENCOUNTER — Ambulatory Visit: Payer: Medicare Other | Admitting: Internal Medicine

## 2020-10-31 MED FILL — ATORVASTATIN CALCIUM 20 MG: 20 | 90 days supply | Qty: 90 | Fill #1

## 2020-11-16 ENCOUNTER — Other Ambulatory Visit: Payer: Self-pay

## 2020-11-16 ENCOUNTER — Encounter (HOSPITAL_COMMUNITY): Payer: Self-pay | Admitting: Emergency Medicine

## 2020-11-16 ENCOUNTER — Inpatient Hospital Stay (HOSPITAL_COMMUNITY)
Admission: EM | Admit: 2020-11-16 | Discharge: 2020-11-22 | DRG: 897 | Disposition: A | Discharge status: Home / Self Care | Payer: Medicare Other | Attending: Internal Medicine | Admitting: Internal Medicine

## 2020-11-16 DIAGNOSIS — R1310 Dysphagia, unspecified: Secondary | ICD-10-CM | POA: Diagnosis present

## 2020-11-16 DIAGNOSIS — F17200 Nicotine dependence, unspecified, uncomplicated: Secondary | ICD-10-CM | POA: Diagnosis present

## 2020-11-16 DIAGNOSIS — R160 Hepatomegaly, not elsewhere classified: Secondary | ICD-10-CM

## 2020-11-16 DIAGNOSIS — Z79899 Other long term (current) drug therapy: Secondary | ICD-10-CM | POA: Diagnosis not present

## 2020-11-16 DIAGNOSIS — R509 Fever, unspecified: Secondary | ICD-10-CM | POA: Diagnosis not present

## 2020-11-16 DIAGNOSIS — F10230 Alcohol dependence with withdrawal, uncomplicated: Secondary | ICD-10-CM | POA: Diagnosis present

## 2020-11-16 DIAGNOSIS — D3500 Benign neoplasm of unspecified adrenal gland: Secondary | ICD-10-CM | POA: Diagnosis present

## 2020-11-16 DIAGNOSIS — E860 Dehydration: Secondary | ICD-10-CM | POA: Diagnosis present

## 2020-11-16 DIAGNOSIS — Z7984 Long term (current) use of oral hypoglycemic drugs: Secondary | ICD-10-CM | POA: Diagnosis not present

## 2020-11-16 DIAGNOSIS — F10930 Alcohol use, unspecified with withdrawal, uncomplicated: Secondary | ICD-10-CM

## 2020-11-16 DIAGNOSIS — E785 Hyperlipidemia, unspecified: Secondary | ICD-10-CM | POA: Diagnosis present

## 2020-11-16 DIAGNOSIS — I10 Essential (primary) hypertension: Secondary | ICD-10-CM | POA: Diagnosis present

## 2020-11-16 DIAGNOSIS — Z20822 Contact with and (suspected) exposure to covid-19: Secondary | ICD-10-CM | POA: Diagnosis present

## 2020-11-16 DIAGNOSIS — Y900 Blood alcohol level of less than 20 mg/100 ml: Secondary | ICD-10-CM | POA: Diagnosis present

## 2020-11-16 DIAGNOSIS — N2889 Other specified disorders of kidney and ureter: Secondary | ICD-10-CM | POA: Diagnosis present

## 2020-11-16 DIAGNOSIS — F1020 Alcohol dependence, uncomplicated: Secondary | ICD-10-CM | POA: Diagnosis present

## 2020-11-16 DIAGNOSIS — Z720 Tobacco use: Secondary | ICD-10-CM | POA: Diagnosis present

## 2020-11-16 DIAGNOSIS — E119 Type 2 diabetes mellitus without complications: Secondary | ICD-10-CM | POA: Diagnosis present

## 2020-11-16 DIAGNOSIS — F17210 Nicotine dependence, cigarettes, uncomplicated: Secondary | ICD-10-CM | POA: Diagnosis present

## 2020-11-16 LAB — URINALYSIS, ROUTINE W REFLEX MICROSCOPIC
Bilirubin Urine: NEGATIVE
Glucose, UA: NEGATIVE mg/dL
Hgb urine dipstick: NEGATIVE
Ketones, ur: NEGATIVE mg/dL
Leukocytes,Ua: NEGATIVE
Nitrite: NEGATIVE
Protein, ur: 30 mg/dL — AB
Specific Gravity, Urine: 1.011 (ref 1.005–1.030)
pH: 7 (ref 5.0–8.0)

## 2020-11-16 LAB — COMPREHENSIVE METABOLIC PANEL
ALT: 28 U/L (ref 0–44)
AST: 30 U/L (ref 15–41)
Albumin: 2.1 g/dL — ABNORMAL LOW (ref 3.5–5.0)
Alkaline Phosphatase: 99 U/L (ref 38–126)
Anion gap: 16 — ABNORMAL HIGH (ref 5–15)
BUN: 12 mg/dL (ref 8–23)
CO2: 22 mmol/L (ref 22–32)
Calcium: 10.7 mg/dL — ABNORMAL HIGH (ref 8.9–10.3)
Chloride: 94 mmol/L — ABNORMAL LOW (ref 98–111)
Creatinine, Ser: 1.07 mg/dL (ref 0.61–1.24)
GFR, Estimated: >60 mL/min (ref >60)
Glucose, Bld: 149 mg/dL — ABNORMAL HIGH (ref 70–99)
Potassium: 3.7 mmol/L (ref 3.5–5.1)
Sodium: 132 mmol/L — ABNORMAL LOW (ref 135–145)
Total Bilirubin: 0.7 mg/dL (ref 0.3–1.2)
Total Protein: 8.5 g/dL — ABNORMAL HIGH (ref 6.5–8.1)

## 2020-11-16 LAB — MAGNESIUM: Magnesium: 1.9 mg/dL (ref 1.7–2.4)

## 2020-11-16 LAB — RAPID URINE DRUG SCREEN, HOSP PERFORMED
Amphetamines: NOT DETECTED
Barbiturates: NOT DETECTED
Benzodiazepines: NOT DETECTED
Cocaine: NOT DETECTED
Opiates: NOT DETECTED
Tetrahydrocannabinol: NOT DETECTED

## 2020-11-16 LAB — CBC
HCT: 39.4 % (ref 39.0–52.0)
Hemoglobin: 12.3 g/dL — ABNORMAL LOW (ref 13.0–17.0)
MCH: 26.9 pg (ref 26.0–34.0)
MCHC: 31.2 g/dL (ref 30.0–36.0)
MCV: 86 fL (ref 80.0–100.0)
Platelets: 485 10*3/uL — ABNORMAL HIGH (ref 150–400)
RBC: 4.58 MIL/uL (ref 4.22–5.81)
RDW: 15.6 % — ABNORMAL HIGH (ref 11.5–15.5)
WBC: 14.5 10*3/uL — ABNORMAL HIGH (ref 4.0–10.5)
nRBC: 0 % (ref 0.0–0.2)

## 2020-11-16 LAB — HIV ANTIBODY (ROUTINE TESTING W REFLEX): HIV Screen 4th Generation wRfx: NONREACTIVE

## 2020-11-16 LAB — CBG MONITORING, ED: Glucose-Capillary: 115 mg/dL — ABNORMAL HIGH (ref 70–99)

## 2020-11-16 LAB — LIPASE, BLOOD: Lipase: 35 U/L (ref 11–51)

## 2020-11-16 LAB — RESP PANEL BY RT-PCR (FLU A&B, COVID) ARPGX2
Influenza A by PCR: NEGATIVE
Influenza B by PCR: NEGATIVE
SARS Coronavirus 2 by RT PCR: NEGATIVE

## 2020-11-16 LAB — ETHANOL: Alcohol, Ethyl (B): <10 mg/dL (ref <10)

## 2020-11-16 LAB — GLUCOSE, CAPILLARY: Glucose-Capillary: 141 mg/dL — ABNORMAL HIGH (ref 70–99)

## 2020-11-16 LAB — POC OCCULT BLOOD, ED: Fecal Occult Bld: NEGATIVE

## 2020-11-16 MED ORDER — ADULT MULTIVITAMIN W/MINERALS CH
1.0000 | ORAL_TABLET | Freq: Every day | ORAL | Status: DC
  Administered 2020-11-17 – 2020-11-22 (×6): 1 via ORAL
  Filled 2020-11-16 (×6): qty 1

## 2020-11-16 MED ORDER — LORAZEPAM 1 MG PO TABS
1.0000 mg | ORAL_TABLET | ORAL | Status: AC | PRN
  Administered 2020-11-18 – 2020-11-19 (×2): 1 mg via ORAL
  Filled 2020-11-16: qty 1
  Filled 2020-11-16: qty 2
  Filled 2020-11-16: qty 1

## 2020-11-16 MED ORDER — ACETAMINOPHEN 650 MG RE SUPP: 650.0000 mg | Freq: Four times a day (QID) | RECTAL | Status: DC | PRN

## 2020-11-16 MED ORDER — ONDANSETRON HCL 4 MG/2ML IJ SOLN: 4.0000 mg | Freq: Four times a day (QID) | INTRAMUSCULAR | Status: DC | PRN

## 2020-11-16 MED ORDER — ATORVASTATIN CALCIUM 10 MG PO TABS
20.0000 mg | ORAL_TABLET | Freq: Every day | ORAL | Status: DC
  Administered 2020-11-17 – 2020-11-22 (×6): 20 mg via ORAL
  Filled 2020-11-16 (×6): qty 2

## 2020-11-16 MED ORDER — DIAZEPAM 5 MG PO TABS
5.0000 mg | ORAL_TABLET | Freq: Four times a day (QID) | ORAL | Status: DC
  Administered 2020-11-16 – 2020-11-19 (×13): 5 mg via ORAL
  Filled 2020-11-16 (×13): qty 1

## 2020-11-16 MED ORDER — MORPHINE SULFATE (PF) 2 MG/ML IV SOLN: 2.0000 mg | INTRAVENOUS | Status: DC | PRN

## 2020-11-16 MED ORDER — THIAMINE HCL 100 MG PO TABS
100.0000 mg | ORAL_TABLET | Freq: Every day | ORAL | Status: DC
  Administered 2020-11-16: 100 mg via ORAL
  Filled 2020-11-16: qty 1

## 2020-11-16 MED ORDER — ALBUTEROL SULFATE (2.5 MG/3ML) 0.083% IN NEBU: 2.5000 mg | INHALATION_SOLUTION | RESPIRATORY_TRACT | Status: DC | PRN

## 2020-11-16 MED ORDER — LORAZEPAM 1 MG PO TABS
1.0000 mg | ORAL_TABLET | ORAL | Status: DC | PRN
  Administered 2020-11-16: 1 mg via ORAL
  Administered 2020-11-16: 2 mg via ORAL
  Filled 2020-11-16: qty 1
  Filled 2020-11-16: qty 2

## 2020-11-16 MED ORDER — INSULIN ASPART 100 UNIT/ML ~~LOC~~ SOLN
0.0000 [IU] | Freq: Three times a day (TID) | SUBCUTANEOUS | Status: DC
  Administered 2020-11-18: 1 [IU] via SUBCUTANEOUS
  Administered 2020-11-18: 2 [IU] via SUBCUTANEOUS
  Administered 2020-11-20: 1 [IU] via SUBCUTANEOUS

## 2020-11-16 MED ORDER — HYDROCODONE-ACETAMINOPHEN 5-325 MG PO TABS: 1.0000 | ORAL_TABLET | ORAL | Status: DC | PRN

## 2020-11-16 MED ORDER — BISACODYL 5 MG PO TBEC: 5.0000 mg | DELAYED_RELEASE_TABLET | Freq: Every day | ORAL | Status: DC | PRN

## 2020-11-16 MED ORDER — ONDANSETRON HCL 4 MG/2ML IJ SOLN
4.0000 mg | Freq: Once | INTRAMUSCULAR | Status: AC
  Administered 2020-11-16: 4 mg via INTRAVENOUS
  Filled 2020-11-16: qty 2

## 2020-11-16 MED ORDER — LORAZEPAM 2 MG/ML IJ SOLN
0.0000 mg | Freq: Four times a day (QID) | INTRAMUSCULAR | Status: DC
  Administered 2020-11-16: 2 mg via INTRAVENOUS
  Administered 2020-11-18: 1 mg via INTRAVENOUS
  Filled 2020-11-16 (×2): qty 1

## 2020-11-16 MED ORDER — THIAMINE HCL 100 MG/ML IJ SOLN
100.0000 mg | Freq: Every day | INTRAMUSCULAR | Status: DC
  Filled 2020-11-16: qty 2

## 2020-11-16 MED ORDER — LORAZEPAM 2 MG/ML IJ SOLN: 1.0000 mg | INTRAMUSCULAR | Status: DC | PRN

## 2020-11-16 MED ORDER — FOLIC ACID 1 MG PO TABS
1.0000 mg | ORAL_TABLET | Freq: Every day | ORAL | Status: DC
  Administered 2020-11-16: 1 mg via ORAL
  Filled 2020-11-16: qty 1

## 2020-11-16 MED ORDER — HYDRALAZINE HCL 20 MG/ML IJ SOLN: 5.0000 mg | INTRAMUSCULAR | Status: DC | PRN

## 2020-11-16 MED ORDER — ONDANSETRON HCL 4 MG PO TABS: 4.0000 mg | ORAL_TABLET | Freq: Four times a day (QID) | ORAL | Status: DC | PRN

## 2020-11-16 MED ORDER — POLYETHYLENE GLYCOL 3350 17 G PO PACK: 17.0000 g | PACK | Freq: Every day | ORAL | Status: DC | PRN

## 2020-11-16 MED ORDER — ACETAMINOPHEN 325 MG PO TABS
650.0000 mg | ORAL_TABLET | Freq: Four times a day (QID) | ORAL | Status: DC | PRN
  Administered 2020-11-18 – 2020-11-21 (×3): 650 mg via ORAL
  Filled 2020-11-16 (×3): qty 2

## 2020-11-16 MED ORDER — THIAMINE HCL 100 MG PO TABS
100.0000 mg | ORAL_TABLET | Freq: Every day | ORAL | Status: DC
  Administered 2020-11-17 – 2020-11-22 (×6): 100 mg via ORAL
  Filled 2020-11-16 (×6): qty 1

## 2020-11-16 MED ORDER — NICOTINE 21 MG/24HR TD PT24
21.0000 mg | MEDICATED_PATCH | Freq: Every day | TRANSDERMAL | Status: DC
  Administered 2020-11-16 – 2020-11-22 (×7): 21 mg via TRANSDERMAL
  Filled 2020-11-16 (×7): qty 1

## 2020-11-16 MED ORDER — LACTATED RINGERS IV SOLN
INTRAVENOUS | Status: DC
  Administered 2020-11-17: 1 mL via INTRAVENOUS

## 2020-11-16 MED ORDER — LORAZEPAM 2 MG/ML IJ SOLN: 0.0000 mg | Freq: Two times a day (BID) | INTRAMUSCULAR | Status: DC

## 2020-11-16 MED ORDER — THIAMINE HCL 100 MG/ML IJ SOLN: 100.0000 mg | Freq: Every day | INTRAMUSCULAR | Status: DC

## 2020-11-16 MED ORDER — DOCUSATE SODIUM 100 MG PO CAPS
100.0000 mg | ORAL_CAPSULE | Freq: Two times a day (BID) | ORAL | Status: DC
  Administered 2020-11-16 – 2020-11-21 (×12): 100 mg via ORAL
  Filled 2020-11-16 (×11): qty 1

## 2020-11-16 MED ORDER — FOLIC ACID 1 MG PO TABS
1.0000 mg | ORAL_TABLET | Freq: Every day | ORAL | Status: DC
  Administered 2020-11-17 – 2020-11-22 (×6): 1 mg via ORAL
  Filled 2020-11-16 (×6): qty 1

## 2020-11-16 MED ORDER — LORAZEPAM 2 MG/ML IJ SOLN: 1.0000 mg | INTRAMUSCULAR | Status: AC | PRN

## 2020-11-16 MED ORDER — SODIUM CHLORIDE 0.9% FLUSH
3.0000 mL | Freq: Two times a day (BID) | INTRAVENOUS | Status: DC
  Administered 2020-11-16 – 2020-11-21 (×10): 3 mL via INTRAVENOUS

## 2020-11-16 MED ORDER — SODIUM CHLORIDE 0.9 % IV BOLUS
1000.0000 mL | Freq: Once | INTRAVENOUS | Status: AC
  Administered 2020-11-16: 1000 mL via INTRAVENOUS

## 2020-11-16 MED ORDER — ENOXAPARIN SODIUM 40 MG/0.4ML ~~LOC~~ SOLN
40.0000 mg | SUBCUTANEOUS | Status: DC
  Administered 2020-11-16 – 2020-11-19 (×4): 40 mg via SUBCUTANEOUS
  Filled 2020-11-16 (×4): qty 0.4

## 2020-11-16 MED ORDER — ADULT MULTIVITAMIN W/MINERALS CH
1.0000 | ORAL_TABLET | Freq: Every day | ORAL | Status: DC
  Administered 2020-11-16: 1 via ORAL
  Filled 2020-11-16: qty 1

## 2020-11-16 NOTE — ED Provider Notes (Signed)
[facility] EMERGENCY DEPARTMENT Provider Note   CSN: [id number] Arrival date & time: 11/16/20  7902     History Chief Complaint  Patient presents with  . Abdominal Pain    Chad Cantu is a 69 y.o. male.  HPI   Patient with significant medical history of diabetes, alcohol abuse, hypertension presents to the emergency department with chief complaint of left lower abdominal pain and dizziness.  Patient states this came on suddenly, when he woke up this morning,  he stood up felt very dizzy and developed left lower quadrant pain.  Patient endorses  the pain lasts about 5 minutes and then went away, described the pain as sharp sensation which does not radiate.  He states that he has had this pain in the past, it comes and goes and is associated with some nausea and diarrhea.  He denies seeing any blood in his stool, but does state that his stools have been more dark looking over the last week.  He has a history of stomach ulcers and states last time he had some pain like this he had a stomach ulcer.  Patient has a history of alcohol use, drinks 10 beers daily, last time he drink beer was yesterday, also endorses that he smokes, denies NSAID use.  He has no significant  abdominal history.  Patient denies fevers, chills, shortness of breath, chest pain, vomiting, dysuria, pedal edema.  Past Medical History:  Diagnosis Date  . Diabetes mellitus without complication ([place])   . ETOH abuse   . Hyperlipidemia   . Hypertension   . Substance abuse ([place])    ETOH, cocaine.  Remission    Patient Active Problem List   Diagnosis Date Noted  . Alcohol dependence ([place]) 11/16/2020  . Near syncope 09/20/2020  . Dehydration 09/20/2020  . Alcohol abuse 09/20/2020  . Alcohol use disorder, severe, dependence ([place])   . Memory difficulty 05/02/2017  . Rash, skin 05/02/2017  . Overactive bladder 05/02/2017  . Prediabetes 05/01/2017  . Alcohol use disorder 05/01/2017  . HLD  (hyperlipidemia) 04/09/2015  . Essential hypertension 04/08/2015  . Smoking 04/08/2015    Past Surgical History:  Procedure Laterality Date  . COLONOSCOPY  5 years ago?   at [facility] maybe per pt,?unsure results  . LAPAROSCOPIC GASTROTOMY W/ REPAIR OF ULCER         Family History  Problem Relation Age of Onset  . Diabetes Mother   . Colon cancer Neg Hx   . Colon polyps Neg Hx   . Esophageal cancer Neg Hx   . Rectal cancer Neg Hx   . Stomach cancer Neg Hx     Social History   Tobacco Use  . Smoking status: Current Every Day Smoker    Packs/day: 1.00    Types: Cigarettes  . Smokeless tobacco: Never Used  Vaping Use  . Vaping Use: Never used  Substance Use Topics  . Alcohol use: Yes    Alcohol/week: 12.0 standard drinks    Types: 12 Cans of beer per week  . Drug use: Not Currently    Types: Marijuana    Comment: THC and Crack last usage 4-5 years ago    Home Medications Prior to Admission medications   Medication Sig Start Date End Date Taking? Authorizing Provider  Ascorbic Acid (VITAMIN C PO) Take by mouth.   Yes [provider]  atorvastatin (LIPITOR) 20 MG tablet Take 1 tablet (20 mg total) by mouth daily. 06/05/20  Yes [name],  MD  metFORMIN (GLUCOPHAGE) 500 MG tablet Take 1 tablet (500 mg total) by mouth daily with breakfast. 06/05/20  Yes [name], MD  amLODipine (NORVASC) 10 MG tablet Take 1 tablet (10 mg total) by mouth daily. Patient not taking: Reported on 11/16/2020 06/05/20   [name], MD  cyclobenzaprine (FLEXERIL) 5 MG tablet Take 1 tablet (5 mg total) by mouth daily as needed for muscle spasms. Patient not taking: Reported on 11/16/2020 06/06/20   [name], MD  varenicline (CHANTIX STARTING MONTH PAK) 0.5 MG X 11 & 1 MG X 42 tablet Take one 0.5 mg tablet by mouth once daily for 3 days, then increase to one 0.5 mg tablet twice daily for 4 days, then increase to one 1 mg tablet twice daily. Patient not taking:  Reported on 11/16/2020 06/05/20   [name], MD  lisinopril (ZESTRIL) 20 MG tablet Take 20 mg by mouth daily. 03/26/20 09/20/20  [provider]    Allergies    Patient has no known allergies.  Review of Systems   Review of Systems  Constitutional: Negative for chills and fever.  HENT: Negative for congestion.   Eyes: Negative for visual disturbance.  Respiratory: Negative for cough and shortness of breath.   Cardiovascular: Negative for chest pain.  Gastrointestinal: Positive for abdominal pain, diarrhea and nausea. Negative for blood in stool and vomiting.  Genitourinary: Negative for enuresis.  Musculoskeletal: Negative for back pain.  Skin: Negative for rash.  Neurological: Positive for dizziness.  Hematological: Does not bruise/bleed easily.    Physical Exam Updated Vital Signs BP 123/85   Pulse (!) 107   Temp 98.5 F (36.9 C) (Oral)   Resp 19   Ht $R'5\' 6"'oX$  (1.676 m)   Wt 72.6 kg   SpO2 100%   BMI 25.82 kg/m   Physical Exam Vitals and nursing note reviewed.  Constitutional:      General: He is not in acute distress.    Appearance: He is not ill-appearing.     Comments: On exam patient appeared to be anxious, vital signs shows tachycardia, he was in no acute distress at this time.  HENT:     Head: Normocephalic and atraumatic.     Nose: No congestion.     Mouth/Throat:     Mouth: Mucous membranes are moist.     Pharynx: Oropharynx is clear. No oropharyngeal exudate or posterior oropharyngeal erythema.  Eyes:     Conjunctiva/sclera: Conjunctivae normal.  Cardiovascular:     Rate and Rhythm: Regular rhythm. Tachycardia present.     Pulses: Normal pulses.     Heart sounds: No murmur heard.  No friction rub. No gallop.   Pulmonary:     Effort: No respiratory distress.     Breath sounds: No wheezing, rhonchi or rales.  Abdominal:     Palpations: Abdomen is soft.     Tenderness: There is no abdominal tenderness.  Musculoskeletal:     Right lower  leg: No edema.     Left lower leg: No edema.  Skin:    General: Skin is warm and dry.  Neurological:     Mental Status: He is alert.  Psychiatric:        Mood and Affect: Mood normal.     ED Results / Procedures / Treatments   Labs (all labs ordered are listed, but only abnormal results are displayed) Labs Reviewed  COMPREHENSIVE METABOLIC PANEL - Abnormal; Notable for the following components:  Result Value   Sodium 132 (*)    Chloride 94 (*)    Glucose, Bld 149 (*)    Calcium 10.7 (*)    Total Protein 8.5 (*)    Albumin 2.1 (*)    Anion gap 16 (*)    All other components within normal limits  CBC - Abnormal; Notable for the following components:   WBC 14.5 (*)    Hemoglobin 12.3 (*)    RDW 15.6 (*)    Platelets 485 (*)    All other components within normal limits  URINALYSIS, ROUTINE W REFLEX MICROSCOPIC - Abnormal; Notable for the following components:   APPearance HAZY (*)    Protein, ur 30 (*)    Bacteria, UA RARE (*)    All other components within normal limits  RESP PANEL BY RT-PCR (FLU A&B, COVID) ARPGX2  ETHANOL  LIPASE, BLOOD  MAGNESIUM  RAPID URINE DRUG SCREEN, HOSP PERFORMED  POC OCCULT BLOOD, ED    EKG EKG Interpretation  Date/Time:  Sunday November 16 2020 09:22:03 EST Ventricular Rate:  120 PR Interval:  130 QRS Duration: 80 QT Interval:  324 QTC Calculation: 457 R Axis:   23 Text Interpretation: Sinus tachycardia Nonspecific ST abnormality Abnormal ECG since last tracing no significant change Confirmed by Noemi Chapel [phone]) on 11/16/2020 12:22:35 PM   Radiology No results found.  Procedures Procedures (including critical care time)  Medications Ordered in ED Medications  LORazepam (ATIVAN) tablet 1-4 mg (2 mg Oral Given 11/16/20 1305)    Or  LORazepam (ATIVAN) injection 1-4 mg ( Intravenous See Alternative 11/16/20 1305)  thiamine tablet 100 mg (100 mg Oral Given 11/16/20 1107)    Or  thiamine (B-1) injection 100 mg (  Intravenous See Alternative 80/3/21 2248)  folic acid (FOLVITE) tablet 1 mg (1 mg Oral Given 11/16/20 1107)  multivitamin with minerals tablet 1 tablet (1 tablet Oral Given 11/16/20 1107)  sodium chloride 0.9 % bolus 1,000 mL (0 mLs Intravenous Stopped 11/16/20 1212)  ondansetron (ZOFRAN) injection 4 mg (4 mg Intravenous Given 11/16/20 1108)    ED Course  I have reviewed the triage vital signs and the nursing notes.  Pertinent labs & imaging results that were available during my care of the patient were reviewed by me and considered in my medical decision making (see chart for details).    MDM Rules/Calculators/A&P                          Patient presents emerged department chief complaint of abdominal pain and lightheadedness.  On exam patient appears to be anxious, vital signs have been for tachycardia, will obtain basic lab work-up, provide him with fluids, Ativan for withdrawal and reevaluate.  Patient has a negative orthostatics, CIWA of 8 will start patient on CIWA protocol.   Patient was reevaluated after providing him with fluids, lorazepam.  His tachycardia has improved but he continues to shake and heart rate remains in the 110.  Concerned that patient is experiencing withdrawal symptoms and recommend admission for further management and monitoring.  Patient is amenable to this.  Will consult with hospitalist for admission.  Spoke with Dr. Lorin Mercy hospitalist team who is agreed to accept the patient, she will come down and evaluate the patient.  CBC shows leukocytosis 14.5, anemia 12.3 appears to be a baseline for patient.  CMP shows slight hyponatremia 132, hyperglycemia of 149, no AKI, hypoalbuminemia 2.1, no elevated liver enzymes, anion gap of 16.  Lipase 35, Hemoccult negative.  EKG shows sinus tach without signs of ischemia no ST of elevation depression noted.    Low suspicion for systemic infection as patient is nontoxic-appearing.  Suspect patient's tachycardia is multifactorial  possibly from withdrawal as well as being dehydrated.  Suspect elevated leukocytosis is an acute phase reactant as there is no signs of infection on exam or are within lab work.  Low suspicion acute abdomen requiring immediate intervention as abdomen was soft nontender to palpation, patient tolerating p.o., no elevation in liver enzymes or alk phos, lipase is 35. Low suspicion for UTI or pyelonephritis as patient denies urinary symptoms, negative CVA tenderness UA negative for nitrates or leukocytes, no hematuria noted on exam.  suspect patient's dizziness is secondary to dehydration as well as possible alcohol withdrawal.  Patient will be transferred to hospice team for further evaluation.  Final Clinical Impression(s) / ED Diagnoses Final diagnoses:  Alcohol withdrawal syndrome without complication [facility])    Rx / DC Orders ED Discharge Orders    None       Maliki, Gignac, PA-C 11/16/20 1358    Noemi Chapel, MD 11/18/20 [phone]

## 2020-11-16 NOTE — ED Triage Notes (Signed)
C/o intermittent LLQ pain x 1-2 months.  Reports LLQ pain, dizziness, nausea, and diarrhea since this morning.  Pt has tremors.  Reports heavy ETOH use and states he is trying to quit.  Last ETOH "2 beers" yesterday.

## 2020-11-16 NOTE — ED Provider Notes (Signed)
This patient is a 69 year old male, complains of several symptoms including left lower quadrant pain as well as having some dizziness and nausea, has had some diarrhea this morning, he is a heavy intake alcoholic, he stopped drinking yesterday, it has been approximately 24 hours.  The patient presents with diffuse tremor and tachycardia which improved with IV fluids and Ativan but still maintains his tachycardia around 115 bpm and still has a tremor when he raises his hands.  The patient was unable to urinate into the urinal spraying urine across the floor, this speaks to the level of the patient's withdrawal, I suspect he probably needs to be admitted to the hospital with more Ativan and fluids for stabilizing care as he is not stable to be discharged in his current withdrawal state.  The patient is agreeable to this plan  Reviewed labs, leukocytosis is nonspecific, heart rate is still in the 1 teens, more Ativan will be given.  No seizures, no hallucinations.  Medical screening examination/treatment/procedure(s) were conducted as a shared visit with non-physician practitioner(s) and myself.  I personally evaluated the patient during the encounter.  Clinical Impression:   Final diagnoses:  Alcohol withdrawal syndrome without complication ([facility])         Noemi Chapel, MD 11/18/20 [phone]

## 2020-11-16 NOTE — H&P (Signed)
History and Physical    Chad Cantu LFY:[id number] DOB: 13-May-1951 DOA: 11/16/2020  PCP: Chad Pier, Cantu Consultants:  Chad Cantu - GI; Chad Cantu - neurology Patient coming from: Home - lives with wife; NOK: Wife, Chad Cantu, [phone]  Chief Complaint: Abdominal pain  HPI: Chad Cantu is a 69 y.o. male with medical history significant of polysubstance abuse; HTN; HLD; and DM presenting with abdominal pain. He reported starting LLQ pain and dizziness with standing.  He has the shakes, no appetite.  He hasn't eaten in weeks to months.  +unintentional weight loss, 160 down to 140s.  Mild dysphagia.  No n/v.  Watery stools once daily, nonbloody.  He drinks "a whole lot" of alcohol and wants to quit.  His last drink was Saturday, only drank 2 cans (compared to usual 9 cans daily).  He can't remember the last day he didn't drink, other than today.  No h/o ETOH withdrawal.  He did have a remote bleeding ulcer associated with alcohol use.  He reports that he has ben to detox innumerable times in the past and his longest period of sobriety was "146 years" (I asked multiple times to confirm that this was his intended response).    ED Course:  Needs admission for ETOH WD.  Came in for abdominal pain, resolved.  Drinks 10 beers daily.  Dry, CIWA 8, given Ativan.  Still tachycardic, shaky.  Review of Systems: As per HPI; otherwise review of systems reviewed and negative.   Ambulatory Status:  Ambulates without assistance  COVID Vaccine Status: Complete  Past Medical History:  Diagnosis Date  . Diabetes mellitus without complication ([place])   . ETOH abuse   . Hyperlipidemia   . Hypertension   . Substance abuse ([place])    ETOH, cocaine.  Remission    Past Surgical History:  Procedure Laterality Date  . COLONOSCOPY  5 years ago?   at [place] maybe per pt,?unsure results  . LAPAROSCOPIC GASTROTOMY W/ REPAIR OF ULCER      Social History   Socioeconomic History  . Marital status:  Married    Spouse name: Not on file  . Number of children: Not on file  . Years of education: Not on file  . Highest education level: Not on file  Occupational History  . Occupation: security guard  Tobacco Use  . Smoking status: Current Every Day Smoker    Packs/day: 2.00    Years: 55.00    Pack years: 110.00    Types: Cigarettes  . Smokeless tobacco: Never Used  Vaping Use  . Vaping Use: Never used  Substance and Sexual Activity  . Alcohol use: Yes    Alcohol/week: 12.0 standard drinks    Types: 12 Cans of beer per week  . Drug use: Yes    Types: Marijuana    Comment: marijuana maybe 2 months ago, remote h/o cocaine use  . Sexual activity: Yes  Other Topics Concern  . Not on file  Social History Narrative  . Not on file   Social Determinants of Health   Financial Resource Strain:   . Difficulty of Paying Living Expenses: Not on file  Food Insecurity:   . Worried About Charity fundraiser in the Last Year: Not on file  . Ran Out of Food in the Last Year: Not on file  Transportation Needs:   . Lack of Transportation (Medical): Not on file  . Lack of Transportation (Non-Medical): Not on file  Physical Activity:   . Days of  Exercise per Week: Not on file  . Minutes of Exercise per Session: Not on file  Stress:   . Feeling of Stress : Not on file  Social Connections:   . Frequency of Communication with Friends and Family: Not on file  . Frequency of Social Gatherings with Friends and Family: Not on file  . Attends Religious Services: Not on file  . Active Member of Clubs or Organizations: Not on file  . Attends Archivist Meetings: Not on file  . Marital Status: Not on file  Intimate Partner Violence:   . Fear of Current or Ex-Partner: Not on file  . Emotionally Abused: Not on file  . Physically Abused: Not on file  . Sexually Abused: Not on file    No Known Allergies  Family History  Problem Relation Age of Onset  . Diabetes Mother   . Colon  cancer Neg Hx   . Colon polyps Neg Hx   . Esophageal cancer Neg Hx   . Rectal cancer Neg Hx   . Stomach cancer Neg Hx     Prior to Admission medications   Medication Sig Start Date End Date Taking? Authorizing Provider  Ascorbic Acid (VITAMIN C PO) Take by mouth.   Yes Chad Cantu  atorvastatin (LIPITOR) 20 MG tablet Take 1 tablet (20 mg total) by mouth daily. 06/05/20  Yes Chad Pier, Cantu  metFORMIN (GLUCOPHAGE) 500 MG tablet Take 1 tablet (500 mg total) by mouth daily with breakfast. 06/05/20  Yes Chad Pier, Cantu  amLODipine (NORVASC) 10 MG tablet Take 1 tablet (10 mg total) by mouth daily. Patient not taking: Reported on 11/16/2020 06/05/20   Chad Pier, Cantu  cyclobenzaprine (FLEXERIL) 5 MG tablet Take 1 tablet (5 mg total) by mouth daily as needed for muscle spasms. Patient not taking: Reported on 11/16/2020 06/06/20   Chad Pier, Cantu  varenicline (CHANTIX STARTING MONTH PAK) 0.5 MG X 11 & 1 MG X 42 tablet Take one 0.5 mg tablet by mouth once daily for 3 days, then increase to one 0.5 mg tablet twice daily for 4 days, then increase to one 1 mg tablet twice daily. Patient not taking: Reported on 11/16/2020 06/05/20   Chad Pier, Cantu  lisinopril (ZESTRIL) 20 MG tablet Take 20 mg by mouth daily. 03/26/20 09/20/20  Chad Cantu    Physical Exam: Vitals:   11/16/20 1230 11/16/20 1259 11/16/20 1300 11/16/20 1345  BP: (!) 144/82 (!) 159/96 121/83 123/85  Pulse: (!) 108 (!) 108 (!) 106 (!) 107  Resp: 18  (!) 22 19  Temp:      TempSrc:      SpO2: 100%  99% 100%  Weight:      Height:         . General:  Appears calm and comfortable and is NAD . Eyes:  PERRL, EOMI, normal lids, iris . ENT:  grossly normal hearing, lips & tongue, mmm; edentulous . Neck:  no LAD, masses or thyromegaly . Cardiovascular:  RR with persistent tachycardia despite Ativan, no m/r/g. No LE edema.  Marland Kitchen Respiratory:   CTA bilaterally with no wheezes/rales/rhonchi.   Normal respiratory effort. . Abdomen:  soft, NT, ND, NABS . Back:   normal alignment, no CVAT . Skin:  no rash or induration seen on limited exam . Musculoskeletal:  grossly normal tone BUE/BLE, good ROM, no bony abnormality . Psychiatric:  grossly normal mood and affect, speech fluent and appropriate, AOx3 but with periodic  answers that indicate confusion . Neurologic:  CN 2-12 grossly intact, moves all extremities in coordinated fashion, tremulous    Radiological Exams on Admission: Independently reviewed - see discussion in A/P where applicable  No results found.  EKG: Independently reviewed.  Sinus tachcyardia with rate 120; nonspecific ST changes with NSCSLT   Labs on Admission: I have personally reviewed the available labs and imaging studies at the time of the admission.  Pertinent labs:   Na++ 132 Glucose 149 Calcium 10.7 Anion gap 16 Mag++ 1.9 Albumin 2.1 WBC 14.5 Hgb 12.3 Platelets 485 UA: 30 protein, rare bacteria Heme negative ETOH <10   Assessment/Plan Principal Problem:   Alcohol dependence with uncomplicated withdrawal ([place]) Active Problems:   Essential hypertension   Smoking   HLD (hyperlipidemia)   Dehydration   ETOH dependence (severe) with withdrawal -Patient with chronic ETOH dependence -Number of drinks per day: 9+ -Number of admissions for management of alcohol withdrawal syndrome (detox): 1 in hospital but patient reports innumerable detox/rehab stints -History of withdrawal seizures, ICU admissions, DTs: 0 -Patient is exhibiting active s/sx of withdrawal, CIWA score is 8 -He is at high risk for complications of withdrawal including seizures, DTs -Will admit to telemetry at this time -CIWA protocol -Folate, thiamine, and MVI ordered -Will provide fixed-dose (Valium 5 mg PO q6h) and also symptom-triggered BZD (Ativan per CIWA protocol) given his prolonged severe ETOH dependence and current signs of persistent withdrawal despite  symptom-triggered BZD. -TOC team consult for possible inpatient treatment -Will also check UDS. -Consider offering a medication for Alcohol Use Disorder at the time of d/c, to include Disulfuram; Naltrexone; or Acamprosate.  Dehydration -Slightly low sodium and high anion gap, indicating dehydration despite normal renal function -Will hydrate and follow  HTN -Stopped taking Norvasc -May need to resume medication soon  DM -Previous A1c indicates good control -hold Glucophage -Cover with sensitive-scale SSI  HLD -Continue Lipitor  Tobacco dependence -Encourage cessation.   -This was discussed with the patient and should be reviewed on an ongoing basis.   -Patch ordered at patient request.      Note: This patient has been tested and is negative for the novel coronavirus COVID-19. The patient has been fully vaccinated against COVID-19.    DVT prophylaxis:  Lovenox  Code Status:  Full - confirmed with patient Family Communication: None present Disposition Plan:  The patient is from: home  Anticipated d/c is to: home vs. Long-term ETOH dependence treatment facility Anticipated d/c date will depend on clinical response to treatment, but likely 2-3 days depending on how he manages through detox  Patient is currently: acutely ill Consults called: TOC team; nutrition Admission status:  Admit - It is my clinical opinion that admission to [place] is reasonable and necessary because of the expectation that this patient will require hospital care that crosses at least 2 midnights to treat this condition based on the medical complexity of the problems presented.  Given the aforementioned information, the predictability of an adverse outcome is felt to be significant.    Karmen Bongo Cantu [facility]   How to contact the [facility] Attending or Consulting provider [place] or covering provider during after hours [place], for this patient?  1. Check the care team in [facility] and look for  a) attending/consulting TRH provider listed and b) the [facility] team listed 2. Log into [email or web address] and use [place]'s universal password to access. If you do not have the password, please contact the hospital operator. 3. Locate  the [facility] provider you are looking for under [facility] and page to a number that you can be directly reached. 4. If you still have difficulty reaching the provider, please page the [facility] (Director on Call) for the Hospitalists listed on amion for assistance.   11/16/2020, 2:15 PM

## 2020-11-17 DIAGNOSIS — I10 Essential (primary) hypertension: Secondary | ICD-10-CM

## 2020-11-17 LAB — CBC
HCT: 30 % — ABNORMAL LOW (ref 39.0–52.0)
Hemoglobin: 10 g/dL — ABNORMAL LOW (ref 13.0–17.0)
MCH: 27.6 pg (ref 26.0–34.0)
MCHC: 33.3 g/dL (ref 30.0–36.0)
MCV: 82.9 fL (ref 80.0–100.0)
Platelets: 399 10*3/uL (ref 150–400)
RBC: 3.62 MIL/uL — ABNORMAL LOW (ref 4.22–5.81)
RDW: 15.2 % (ref 11.5–15.5)
WBC: 11.1 10*3/uL — ABNORMAL HIGH (ref 4.0–10.5)
nRBC: 0 % (ref 0.0–0.2)

## 2020-11-17 LAB — BASIC METABOLIC PANEL
Anion gap: 10 (ref 5–15)
BUN: 11 mg/dL (ref 8–23)
CO2: 29 mmol/L (ref 22–32)
Calcium: 10 mg/dL (ref 8.9–10.3)
Chloride: 95 mmol/L — ABNORMAL LOW (ref 98–111)
Creatinine, Ser: 0.76 mg/dL (ref 0.61–1.24)
GFR, Estimated: >60 mL/min (ref >60)
Glucose, Bld: 110 mg/dL — ABNORMAL HIGH (ref 70–99)
Potassium: 3.4 mmol/L — ABNORMAL LOW (ref 3.5–5.1)
Sodium: 134 mmol/L — ABNORMAL LOW (ref 135–145)

## 2020-11-17 LAB — GLUCOSE, CAPILLARY
Glucose-Capillary: 110 mg/dL — ABNORMAL HIGH (ref 70–99)
Glucose-Capillary: 109 mg/dL — ABNORMAL HIGH (ref 70–99)
Glucose-Capillary: 91 mg/dL (ref 70–99)
Glucose-Capillary: 90 mg/dL (ref 70–99)

## 2020-11-17 MED ORDER — POTASSIUM CHLORIDE CRYS ER 20 MEQ PO TBCR
40.0000 meq | EXTENDED_RELEASE_TABLET | Freq: Once | ORAL | Status: AC
  Administered 2020-11-17: 40 meq via ORAL
  Filled 2020-11-17: qty 2

## 2020-11-17 MED ORDER — ENSURE ENLIVE PO LIQD
237.0000 mL | Freq: Two times a day (BID) | ORAL | Status: DC
  Administered 2020-11-17 – 2020-11-21 (×6): 237 mL via ORAL
  Filled 2020-11-17 (×2): qty 237

## 2020-11-17 NOTE — Progress Notes (Signed)
Initial Nutrition Assessment  INTERVENTION:   -Ensure Enlive po BID, each supplement provides 350 kcal and 20 grams of protein  NUTRITION DIAGNOSIS:   Inadequate oral intake related to poor appetite (polysubstance abuse) as evidenced by per patient/family report.  GOAL:   Patient will meet greater than or equal to 90% of their needs  MONITOR:   PO intake, Supplement acceptance, Labs, Weight trends, I & O's  REASON FOR ASSESSMENT:   Consult Assessment of nutrition requirement/status  ASSESSMENT:   69 y.o. male with medical history significant of polysubstance abuse; HTN; HLD; and DM presenting with abdominal pain.  Spoke with pt's wife over the phone with pt in the room. Pt's wife reports pt is eating better today. Appetite was poor PTA but is now improving. Pt with history of alcohol abuse, drinking 10 beers daily.  Pt is willing to try protein supplements between meals. RD placed order.  Per weight records, pt has lost 5 lbs since 6/24, insignificant for time frame.  Medications: Colace, Folic acid, Multivitamin with minerals daily, KLOR-CON, Thiamine, Lactated ringers  Labs reviewed: CBGs: 109-110 Low Na, K  NUTRITION - FOCUSED PHYSICAL EXAM:  Unable to complete  Diet Order:   Diet Order            Diet heart healthy/carb modified Room service appropriate? Yes; Fluid consistency: Thin  Diet effective now                 EDUCATION NEEDS:   No education needs have been identified at this time  Skin:  Skin Assessment: Reviewed RN Assessment  Last BM:  12/5  Height:   Ht Readings from Last 1 Encounters:  11/16/20 5\' 6"  (1.676 m)    Weight:   Wt Readings from Last 1 Encounters:  11/16/20 72.6 kg   BMI:  Body mass index is 25.82 kg/m.  Estimated Nutritional Needs:   Kcal:  1900-2100  Protein:  100-115g  Fluid:  2L/day  Chad Bibles, MS, RD, LDN Inpatient Clinical Dietitian Contact information available via Amion

## 2020-11-17 NOTE — Progress Notes (Signed)
PROGRESS NOTE    Chad Cantu  CHE:[id number] DOB: 1951/11/10 DOA: 11/16/2020 PCP: [name], MD    Brief Narrative:  69 y.o. male with medical history significant of polysubstance abuse; HTN; HLD; and DM presenting with abdominal pain. He reported starting LLQ pain and dizziness with standing and shakes. Pt reports drinking over 9 beers daily but decided to cut back and only drank 2 beers two days prior to admission. Pt was admitted for management of ETOH withdrawals  Assessment & Plan:   Principal Problem:   Alcohol dependence with uncomplicated withdrawal ([place]) Active Problems:   Essential hypertension   Smoking   HLD (hyperlipidemia)   Dehydration   ETOH dependence (severe) with withdrawal -Patient with chronic ETOH dependence -Number of drinks per day: 9+ -Was continued on Valium 5 mg PO q6h and ativan per CIWA -TOC team consulted. Plan [facility], appt for 12/8 at 8:30am for intake -CIWA is improving, down to 1-2 this afternoon although pt remains tachycardic with HR in the 100's. Also continues to have visible shaking -Pt noted to be "shaky" when trying to ambulate. Have consulted PT  Dehydration -Clinically improved with IVF hydration  HTN -Stopped taking Norvasc -BP seems stable  DM -Previous A1c indicates good control -hold Glucophage -Continue SSI coverage  HLD -Continue Lipitor  Tobacco dependence -Cessation was done at bedside -Pt has a desire to quit smoking as well. Pt congratulated -Continued on nicotine patch  DVT prophylaxis: Lovenox subq Code Status: Full Family Communication: Pt in room, family not at bedside  Status is: Inpatient  Remains inpatient appropriate because:IV treatments appropriate due to intensity of illness or inability to take PO and Inpatient level of care appropriate due to severity of illness   Dispo: The patient is from: Home              Anticipated d/c is to: Home              Anticipated  d/c date is: 2 days              Patient currently is not medically stable to d/c.       Consultants:     Procedures:     Antimicrobials: Anti-infectives (From admission, onward)   None       Subjective: Still shaking but pt reports he is feeing better since admit  Objective: Vitals:   11/16/20 1815 11/16/20 2045 11/17/20 0608 11/17/20 1454  BP: 122/90 116/71 125/81 (!) 130/91  Pulse: (!) 115 (!) 109 (!) 103 (!) 102  Resp: 16 20  16   Temp:  100.2 F (37.9 C) 99.5 F (37.5 C)   TempSrc:  Oral Oral   SpO2: 100% 95% 97% 100%  Weight:      Height:        Intake/Output Summary (Last 24 hours) at 11/17/2020 1658 Last data filed at 11/17/2020 0330 Gross per 24 hour  Intake 475 ml  Output 625 ml  Net -150 ml   Filed Weights   11/16/20 0925  Weight: 72.6 kg    Examination:  General exam: Appears calm and comfortable  Respiratory system: Clear to auscultation. Respiratory effort normal. Cardiovascular system: S1 & S2 heard, Regular Gastrointestinal system: Abdomen is nondistended, soft and nontender. No organomegaly or masses felt. Normal bowel sounds heard. Central nervous system: Alert and oriented. No focal neurological deficits. Extremities: Symmetric 5 x 5 power. Skin: No rashes, lesions Psychiatry: Judgement and insight appear normal. Mood & affect appropriate.  Data Reviewed: I have personally reviewed following labs and imaging studies  CBC: Recent Labs  Lab 11/16/20 0936 11/17/20 0210  WBC 14.5* 11.1*  HGB 12.3* 10.0*  HCT 39.4 30.0*  MCV 86.0 82.9  PLT 485* 676   Basic Metabolic Panel: Recent Labs  Lab 11/16/20 0936 11/16/20 1017 11/17/20 0210  NA 132*  --  134*  K 3.7  --  3.4*  CL 94*  --  95*  CO2 22  --  29  GLUCOSE 149*  --  110*  BUN 12  --  11  CREATININE 1.07  --  0.76  CALCIUM 10.7*  --  10.0  MG  --  1.9  --    GFR: Estimated Creatinine Clearance: 80.9 mL/min (by C-G formula based on SCr of 0.76 mg/dL). Liver  Function Tests: Recent Labs  Lab 11/16/20 0936  AST 30  ALT 28  ALKPHOS 99  BILITOT 0.7  PROT 8.5*  ALBUMIN 2.1*   Recent Labs  Lab 11/16/20 0936  LIPASE 35   No results for input(s): AMMONIA in the last 168 hours. Coagulation Profile: No results for input(s): INR, PROTIME in the last 168 hours. Cardiac Enzymes: No results for input(s): CKTOTAL, CKMB, CKMBINDEX, TROPONINI in the last 168 hours. BNP (last 3 results) No results for input(s): PROBNP in the last 8760 hours. HbA1C: No results for input(s): HGBA1C in the last 72 hours. CBG: Recent Labs  Lab 11/16/20 1717 11/16/20 2103 11/17/20 0749 11/17/20 1122 11/17/20 1620  GLUCAP 115* 141* 110* 109* 91   Lipid Profile: No results for input(s): CHOL, HDL, LDLCALC, TRIG, CHOLHDL, LDLDIRECT in the last 72 hours. Thyroid Function Tests: No results for input(s): TSH, T4TOTAL, FREET4, T3FREE, THYROIDAB in the last 72 hours. Anemia Panel: No results for input(s): VITAMINB12, FOLATE, FERRITIN, TIBC, IRON, RETICCTPCT in the last 72 hours. Sepsis Labs: No results for input(s): PROCALCITON, LATICACIDVEN in the last 168 hours.  Recent Results (from the past 240 hour(s))  Resp Panel by RT-PCR (Flu A&B, Covid) Nasopharyngeal Swab     Status: None   Collection Time: 11/16/20 12:56 PM   Specimen: Nasopharyngeal Swab; Nasopharyngeal(NP) swabs in vial transport medium  Result Value Ref Range Status   SARS Coronavirus 2 by RT PCR NEGATIVE NEGATIVE Final    Comment: (NOTE) SARS-CoV-2 target nucleic acids are NOT DETECTED.  The SARS-CoV-2 RNA is generally detectable in upper respiratory specimens during the acute phase of infection. The lowest concentration of SARS-CoV-2 viral copies this assay can detect is 138 copies/mL. A negative result does not preclude SARS-Cov-2 infection and should not be used as the sole basis for treatment or other patient management decisions. A negative result may occur with  improper specimen  collection/handling, submission of specimen other than nasopharyngeal swab, presence of viral mutation(s) within the areas targeted by this assay, and inadequate number of viral copies(<138 copies/mL). A negative result must be combined with clinical observations, patient history, and epidemiological information. The expected result is Negative.  Fact Sheet for Patients:  EntrepreneurPulse.com.au  Fact Sheet for Healthcare Providers:  IncredibleEmployment.be  This test is no t yet approved or cleared by the [place] FDA and  has been authorized for detection and/or diagnosis of SARS-CoV-2 by FDA under an Emergency Use Authorization (EUA). This EUA will remain  in effect (meaning this test can be used) for the duration of the COVID-19 declaration under Section 564(b)(1) of the Act, 21 U.S.C.section 360bbb-3(b)(1), unless the authorization is terminated  or revoked sooner.  Influenza A by PCR NEGATIVE NEGATIVE Final   Influenza B by PCR NEGATIVE NEGATIVE Final    Comment: (NOTE) The Xpert Xpress SARS-CoV-2/FLU/RSV plus assay is intended as an aid in the diagnosis of influenza from Nasopharyngeal swab specimens and should not be used as a sole basis for treatment. Nasal washings and aspirates are unacceptable for Xpert Xpress SARS-CoV-2/FLU/RSV testing.  Fact Sheet for Patients: EntrepreneurPulse.com.au  Fact Sheet for Healthcare Providers: IncredibleEmployment.be  This test is not yet approved or cleared by the [place] FDA and has been authorized for detection and/or diagnosis of SARS-CoV-2 by FDA under an Emergency Use Authorization (EUA). This EUA will remain in effect (meaning this test can be used) for the duration of the COVID-19 declaration under Section 564(b)(1) of the Act, 21 U.S.C. section 360bbb-3(b)(1), unless the authorization is terminated or revoked.  Performed at [facility], [place] [address] [place], [place] [address]      Radiology Studies: No results found.  Scheduled Meds: . atorvastatin  20 mg Oral Daily  . diazepam  5 mg Oral Q6H  . docusate sodium  100 mg Oral BID  . enoxaparin (LOVENOX) injection  40 mg Subcutaneous Q24H  . feeding supplement  237 mL Oral BID BM  . folic acid  1 mg Oral Daily  . insulin aspart  0-9 Units Subcutaneous TID WC  . LORazepam  0-4 mg Intravenous Q6H   Followed by  . [START ON 11/18/2020] LORazepam  0-4 mg Intravenous Q12H  . multivitamin with minerals  1 tablet Oral Daily  . nicotine  21 mg Transdermal Daily  . sodium chloride flush  3 mL Intravenous Q12H  . thiamine  100 mg Oral Daily   Or  . thiamine  100 mg Intravenous Daily   Continuous Infusions: . lactated ringers 75 mL/hr at 11/17/20 1640     LOS: 1 day   Marylu Lund, MD [facility] Pager On Amion  If 7PM-7AM, please contact night-coverage 11/17/2020, 4:58 PM

## 2020-11-17 NOTE — TOC CAGE-AID Note (Signed)
Transition of Care [facility]) - CAGE-AID Screening   Patient Details  Name: Chad Cantu MRN: [id number] Date of Birth: 1951-11-07  Transition of Care [facility]) CM/SW Contact:    Joanne Chars, LCSW Phone Number: 11/17/2020, 4:11 PM   Clinical Narrative:CSW met with pt and wife to discuss alcohol use.  Pt gives permission for wife to be present for conversation.  Pt reports daily drinking of 9 beers per day for "half my life."  Pt has been to [facility] 3 years ago, has been part of AA in past, would be interested in getting back into treatment.  Discussed options and pt would like to try [facility] outpt IOP program.  CSW was able to speak to [place] at [place] and schedule appt for Wed 12/8 at 830am for intake.     CAGE-AID Screening:    Have You Ever Felt You Ought to Cut Down on Your Drinking or Drug Use?: Yes Have People Annoyed You By Critizing Your Drinking Or Drug Use?: Yes Have You Felt Bad Or Guilty About Your Drinking Or Drug Use?: Yes Have You Ever Had a Drink or Used Drugs First Thing In The Morning to Steady Your Nerves or to Get Rid of a Hangover?: Yes CAGE-AID Score: 4  Substance Abuse Education Offered: Yes  Substance abuse interventions: Referral to (must comment) ([facility])

## 2020-11-18 ENCOUNTER — Inpatient Hospital Stay (HOSPITAL_COMMUNITY): Payer: Medicare Other

## 2020-11-18 DIAGNOSIS — E860 Dehydration: Secondary | ICD-10-CM

## 2020-11-18 LAB — COMPREHENSIVE METABOLIC PANEL
ALT: 48 U/L — ABNORMAL HIGH (ref 0–44)
AST: 39 U/L (ref 15–41)
Albumin: 1.9 g/dL — ABNORMAL LOW (ref 3.5–5.0)
Alkaline Phosphatase: 80 U/L (ref 38–126)
Anion gap: 10 (ref 5–15)
BUN: 7 mg/dL — ABNORMAL LOW (ref 8–23)
CO2: 28 mmol/L (ref 22–32)
Calcium: 10.9 mg/dL — ABNORMAL HIGH (ref 8.9–10.3)
Chloride: 98 mmol/L (ref 98–111)
Creatinine, Ser: 0.77 mg/dL (ref 0.61–1.24)
GFR, Estimated: >60 mL/min (ref >60)
Glucose, Bld: 95 mg/dL (ref 70–99)
Potassium: 4.5 mmol/L (ref 3.5–5.1)
Sodium: 136 mmol/L (ref 135–145)
Total Bilirubin: 0.5 mg/dL (ref 0.3–1.2)
Total Protein: 7.2 g/dL (ref 6.5–8.1)

## 2020-11-18 LAB — MAGNESIUM: Magnesium: 1.3 mg/dL — ABNORMAL LOW (ref 1.7–2.4)

## 2020-11-18 LAB — GLUCOSE, CAPILLARY
Glucose-Capillary: 90 mg/dL (ref 70–99)
Glucose-Capillary: 153 mg/dL — ABNORMAL HIGH (ref 70–99)
Glucose-Capillary: 135 mg/dL — ABNORMAL HIGH (ref 70–99)
Glucose-Capillary: 129 mg/dL — ABNORMAL HIGH (ref 70–99)

## 2020-11-18 NOTE — Progress Notes (Signed)
   11/18/20 1517  Vitals  Temp (!) 100.8 F (38.2 C)  Temp Source Oral  BP 128/68  BP Location Right Arm  BP Method Automatic  Patient Position (if appropriate) Lying  Pulse Rate (!) 122  Pulse Rate Source Dinamap  ECG Heart Rate (!) 126  Resp 16  Level of Consciousness  Level of Consciousness Alert  MEWS COLOR  MEWS Score Color Yellow  Oxygen Therapy  SpO2 99 %  O2 Device Room Air  Pain Assessment  Pain Scale 0-10  Pain Score 0  MEWS Score  MEWS Temp 1  MEWS Systolic 0  MEWS Pulse 2  MEWS RR 0  MEWS LOC 0  MEWS Score 3

## 2020-11-18 NOTE — Evaluation (Signed)
Physical Therapy Evaluation Patient Details Name: Chad Cantu MRN: [id number] DOB: September 19, 1951 Today's Date: 11/18/2020   History of Present Illness  69 y/o M presenting to ED on 12/5 for LLQ pain (on and off 1-2 months), dizziness, nausea, diarhea, and tremors. PMHx: DM, polysubstance abuse, HTN, and HLD.  Clinical Impression  Pt had incontinence episode while getting up from the bed that lasted while he walked to the bathroom. Pt didn't acknowledge his incontinence but states he can get on his own to the bathroom at home, so cognition was difficult to assess. Pt unsteady in standing balance and sit to stand during incontinence but after changed to min guard and was able to walk 320 feet. States that he is 'woozy' during ambulation but with no signs of instability, SpO2 at 100%, and HR WNL. Pt would benefit from skilled therapy in order to further assess cognition and work on stair navigation so that pt can safely get back into his apartment. Pt doesn't need PT at d/c since he is ambulating community distances.    Follow Up Recommendations No PT follow up    Equipment Recommendations       Recommendations for Other Services       Precautions / Restrictions Precautions Precautions: Fall Restrictions Weight Bearing Restrictions: No      Mobility  Bed Mobility Overal bed mobility: Modified Independent             General bed mobility comments: pt modified independent to get towards EOB with increased time    Transfers Overall transfer level: Needs assistance Equipment used: None Transfers: Sit to/from Stand Sit to Stand: Min assist;Min guard         General transfer comment: pt performed 1x sit to stand to get from bed to bathroom and was min assist for stability during incontinence episode; after incontinence episode and getting pt cleaned and then walking, pt performed 3x sit to stand and was min guard  Ambulation/Gait Ambulation/Gait assistance: Min guard;Min  assist Gait Distance (Feet): 320 Feet Assistive device: None Gait Pattern/deviations: Decreased stride length   Gait velocity interpretation: 1.31 - 2.62 ft/sec, indicative of limited community ambulator General Gait Details: pt was min assist for 30 feet ambulation to and from the bathroom during incontinence episode for stability; after pt was cleaned up and ambulated in the hallway, he was min guard for safety; pt stated that he 'woozy' with mobility but SpO2 100% and HR WNL  Stairs            Wheelchair Mobility    Modified Rankin (Stroke Patients Only)       Balance Overall balance assessment: Needs assistance Sitting-balance support: Feet supported;No upper extremity supported Sitting balance-Leahy Scale: Fair       Standing balance-Leahy Scale: Poor Standing balance comment: pt required min assist for static standing balance during incontinence episode for stability, after incontinence episode, pt was min guard for standing balance and standing peri care                             Pertinent Vitals/Pain Pain Assessment: No/denies pain    Home Living Family/patient expects to be discharged to:: Private residence Living Arrangements: Spouse/significant other Available Help at Discharge: Family;Available 24 hours/day (spuse) Type of Home: Apartment Home Access: Stairs to enter Entrance Stairs-Rails: Can reach both Entrance Stairs-Number of Steps: 14 Home Layout: One level Home Equipment: None      Prior Function Level of Independence:  Independent         Comments: independent, drives, 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     Hand Dominance        Extremity/Trunk Assessment   Upper Extremity Assessment Upper Extremity Assessment: Overall WFL for tasks assessed    Lower Extremity Assessment Lower Extremity Assessment: Overall WFL for tasks assessed    Cervical / Trunk Assessment Cervical / Trunk Assessment: Normal   Communication   Communication: No difficulties  Cognition Arousal/Alertness: Awake/alert Behavior During Therapy: WFL for tasks assessed/performed Overall Cognitive Status: Difficult to assess                                 General Comments: pt had episode of incontinence in bed and as walking to the bathroom, unable to assess if he knew he was incontient or didn't want to acknowledge it; pt states that he can usually get to the bathroom without incontinence at home      General Comments      Exercises     Assessment/Plan    PT Assessment Patient needs continued PT services  PT Problem List Decreased cognition;Decreased balance;Decreased knowledge of precautions       PT Treatment Interventions Balance training;Gait training;Stair training;Patient/family education;Functional mobility training;Therapeutic activities;Cognitive remediation    PT Goals (Current goals can be found in the Care Plan section)  Acute Rehab PT Goals Patient Stated Goal: get back to work PT Goal Formulation: With patient Time For Goal Achievement: 12/02/20 Potential to Achieve Goals: Fair    Frequency Min 3X/week   Barriers to discharge        Co-evaluation               AM-PAC PT "6 Clicks" Mobility  Outcome Measure Help needed turning from your back to your side while in a flat bed without using bedrails?: None Help needed moving from lying on your back to sitting on the side of a flat bed without using bedrails?: None Help needed moving to and from a bed to a chair (including a wheelchair)?: A Little Help needed standing up from a chair using your arms (e.g., wheelchair or bedside chair)?: A Little Help needed to walk in hospital room?: A Little Help needed climbing 3-5 steps with a railing? : A Little 6 Click Score: 20    End of Session Equipment Utilized During Treatment: Gait belt Activity Tolerance: Patient tolerated treatment well Patient left: in chair;with  nursing/sitter in room;with chair alarm set;with call bell/phone within reach   PT Visit Diagnosis: Unsteadiness on feet (R26.81);Difficulty in walking, not elsewhere classified (R26.2)    Time: 2707-8675 PT Time Calculation (min) (ACUTE ONLY): 32 min   Charges:   PT Evaluation $PT Eval Moderate Complexity: 1 Mod PT Treatments $Gait Training: 8-22 mins        Caleb Popp, SPT [id number]  Bexleigh Theriault 11/18/2020, 9:41 AM

## 2020-11-18 NOTE — Progress Notes (Signed)
PROGRESS NOTE    Chad Cantu  IAX:[id number] DOB: 10/03/1951 DOA: 11/16/2020 PCP: [name], MD    Brief Narrative:  69 y.o. male with medical history significant of polysubstance abuse; HTN; HLD; and DM presenting with abdominal pain. He reported starting LLQ pain and dizziness with standing and shakes. Pt reports drinking over 9 beers daily but decided to cut back and only drank 2 beers two days prior to admission. Pt was admitted for management of ETOH withdrawals  Assessment & Plan:   Principal Problem:   Alcohol dependence with uncomplicated withdrawal ([place]) Active Problems:   Essential hypertension   Smoking   HLD (hyperlipidemia)   Dehydration   ETOH dependence (severe) with withdrawal -Patient with chronic ETOH dependence -Number of drinks per day: 9+ -Was continued on Valium 5 mg PO q6h and ativan per CIWA -TOC team consulted. Plan for [facility] program, appt for 12/8 at 8:30am for intake -CIWA overall improved, still at 5 this afternoon with continued shaking and unsteadiness on his feet. Pt remains tachycardic with HR over 100 beats with tachypnea with RR in the mid-upper 20's -Will continue above scheduled valium with CIWA  Dehydration -Clinically improved with IVF hydration  HTN -Stopped taking Norvasc -BP seems stable at this time  DM -Previous A1c indicates good control -hold Glucophage -Continue SSI coverage as needed  HLD -Continue Lipitor  Tobacco dependence -Cessation was done at bedside -Pt has a desire to quit smoking as well. Pt congratulated -Continued on nicotine patch while in hospital  Fever -Tmax of 100.25F overnight -Presenting UA is unremarkable -Will check CXR -Hold off on antimicrobial for now. If pt continue to have elevated temp, then would check blood cx at that time  DVT prophylaxis: Lovenox subq Code Status: Full Family Communication: Pt in room, family not at bedside  Status is: Inpatient  Remains  inpatient appropriate because:IV treatments appropriate due to intensity of illness or inability to take PO and Inpatient level of care appropriate due to severity of illness   Dispo: The patient is from: Home              Anticipated d/c is to: Home              Anticipated d/c date is: 2 days              Patient currently is not medically stable to d/c.    Consultants:     Procedures:     Antimicrobials: Anti-infectives (From admission, onward)   None      Subjective: Still shaking and unsteady on his feet  Objective: Vitals:   11/17/20 0608 11/17/20 1454 11/17/20 2300 11/18/20 0500  BP: 125/81 (!) 130/91 124/78 120/75  Pulse: (!) 103 (!) 102 98 95  Resp:  16 17 17   Temp: 99.5 F (37.5 C)  100.3 F (37.9 C) 99.5 F (37.5 C)  TempSrc: Oral  Oral Oral  SpO2: 97% 100%    Weight:      Height:        Intake/Output Summary (Last 24 hours) at 11/18/2020 1439 Last data filed at 11/18/2020 0136 Gross per 24 hour  Intake 2181.24 ml  Output 1100 ml  Net 1081.24 ml   Filed Weights   11/16/20 0925  Weight: 72.6 kg    Examination: General exam: Awake, laying in bed, in nad Respiratory system: Normal respiratory effort, no wheezing Cardiovascular system: regular rate, s1, s2 Gastrointestinal system: Soft, nondistended, positive BS Central nervous system: CN2-12 grossly intact,  strength intact, tremors Extremities: Perfused, no clubbing Skin: Normal skin turgor, no notable skin lesions seen Psychiatry: Mood normal // no visual hallucinations   Data Reviewed: I have personally reviewed following labs and imaging studies  CBC: Recent Labs  Lab 11/16/20 0936 11/17/20 0210  WBC 14.5* 11.1*  HGB 12.3* 10.0*  HCT 39.4 30.0*  MCV 86.0 82.9  PLT 485* 073   Basic Metabolic Panel: Recent Labs  Lab 11/16/20 0936 11/16/20 1017 11/17/20 0210 11/18/20 0117  NA 132*  --  134* 136  K 3.7  --  3.4* 4.5  CL 94*  --  95* 98  CO2 22  --  29 28  GLUCOSE 149*  --   110* 95  BUN 12  --  11 7*  CREATININE 1.07  --  0.76 0.77  CALCIUM 10.7*  --  10.0 10.9*  MG  --  1.9  --  1.3*   GFR: Estimated Creatinine Clearance: 80.9 mL/min (by C-G formula based on SCr of 0.77 mg/dL). Liver Function Tests: Recent Labs  Lab 11/16/20 0936 11/18/20 0117  AST 30 39  ALT 28 48*  ALKPHOS 99 80  BILITOT 0.7 0.5  PROT 8.5* 7.2  ALBUMIN 2.1* 1.9*   Recent Labs  Lab 11/16/20 0936  LIPASE 35   No results for input(s): AMMONIA in the last 168 hours. Coagulation Profile: No results for input(s): INR, PROTIME in the last 168 hours. Cardiac Enzymes: No results for input(s): CKTOTAL, CKMB, CKMBINDEX, TROPONINI in the last 168 hours. BNP (last 3 results) No results for input(s): PROBNP in the last 8760 hours. HbA1C: No results for input(s): HGBA1C in the last 72 hours. CBG: Recent Labs  Lab 11/17/20 1122 11/17/20 1620 11/17/20 2152 11/18/20 0823 11/18/20 1216  GLUCAP 109* 91 90 90 153*   Lipid Profile: No results for input(s): CHOL, HDL, LDLCALC, TRIG, CHOLHDL, LDLDIRECT in the last 72 hours. Thyroid Function Tests: No results for input(s): TSH, T4TOTAL, FREET4, T3FREE, THYROIDAB in the last 72 hours. Anemia Panel: No results for input(s): VITAMINB12, FOLATE, FERRITIN, TIBC, IRON, RETICCTPCT in the last 72 hours. Sepsis Labs: No results for input(s): PROCALCITON, LATICACIDVEN in the last 168 hours.  Recent Results (from the past 240 hour(s))  Resp Panel by RT-PCR (Flu A&B, Covid) Nasopharyngeal Swab     Status: None   Collection Time: 11/16/20 12:56 PM   Specimen: Nasopharyngeal Swab; Nasopharyngeal(NP) swabs in vial transport medium  Result Value Ref Range Status   SARS Coronavirus 2 by RT PCR NEGATIVE NEGATIVE Final    Comment: (NOTE) SARS-CoV-2 target nucleic acids are NOT DETECTED.  The SARS-CoV-2 RNA is generally detectable in upper respiratory specimens during the acute phase of infection. The lowest concentration of SARS-CoV-2 viral  copies this assay can detect is 138 copies/mL. A negative result does not preclude SARS-Cov-2 infection and should not be used as the sole basis for treatment or other patient management decisions. A negative result may occur with  improper specimen collection/handling, submission of specimen other than nasopharyngeal swab, presence of viral mutation(s) within the areas targeted by this assay, and inadequate number of viral copies(<138 copies/mL). A negative result must be combined with clinical observations, patient history, and epidemiological information. The expected result is Negative.  Fact Sheet for Patients:  EntrepreneurPulse.com.au  Fact Sheet for Healthcare Providers:  IncredibleEmployment.be  This test is no t yet approved or cleared by the [place] FDA and  has been authorized for detection and/or diagnosis of SARS-CoV-2 by FDA under an  Emergency Use Authorization (EUA). This EUA will remain  in effect (meaning this test can be used) for the duration of the COVID-19 declaration under Section 564(b)(1) of the Act, 21 U.S.C.section 360bbb-3(b)(1), unless the authorization is terminated  or revoked sooner.       Influenza A by PCR NEGATIVE NEGATIVE Final   Influenza B by PCR NEGATIVE NEGATIVE Final    Comment: (NOTE) The Xpert Xpress SARS-CoV-2/FLU/RSV plus assay is intended as an aid in the diagnosis of influenza from Nasopharyngeal swab specimens and should not be used as a sole basis for treatment. Nasal washings and aspirates are unacceptable for Xpert Xpress SARS-CoV-2/FLU/RSV testing.  Fact Sheet for Patients: EntrepreneurPulse.com.au  Fact Sheet for Healthcare Providers: IncredibleEmployment.be  This test is not yet approved or cleared by the [place] FDA and has been authorized for detection and/or diagnosis of SARS-CoV-2 by FDA under an Emergency Use Authorization (EUA). This  EUA will remain in effect (meaning this test can be used) for the duration of the COVID-19 declaration under Section 564(b)(1) of the Act, 21 U.S.C. section 360bbb-3(b)(1), unless the authorization is terminated or revoked.  Performed at [facility], [place] [address] [place], [place] [address]      Radiology Studies: No results found.  Scheduled Meds: . atorvastatin  20 mg Oral Daily  . diazepam  5 mg Oral Q6H  . docusate sodium  100 mg Oral BID  . enoxaparin (LOVENOX) injection  40 mg Subcutaneous Q24H  . feeding supplement  237 mL Oral BID BM  . folic acid  1 mg Oral Daily  . insulin aspart  0-9 Units Subcutaneous TID WC  . LORazepam  0-4 mg Intravenous Q12H  . multivitamin with minerals  1 tablet Oral Daily  . nicotine  21 mg Transdermal Daily  . sodium chloride flush  3 mL Intravenous Q12H  . thiamine  100 mg Oral Daily   Or  . thiamine  100 mg Intravenous Daily   Continuous Infusions: . lactated ringers 75 mL/hr at 11/18/20 0521     LOS: 2 days   Marylu Lund, MD [facility] Pager On Amion  If 7PM-7AM, please contact night-coverage 11/18/2020, 2:39 PM

## 2020-11-19 ENCOUNTER — Ambulatory Visit (HOSPITAL_COMMUNITY): Payer: Medicare Other | Admitting: Licensed Clinical Social Worker

## 2020-11-19 ENCOUNTER — Inpatient Hospital Stay (HOSPITAL_COMMUNITY): Payer: Medicare Other

## 2020-11-19 DIAGNOSIS — F10230 Alcohol dependence with withdrawal, uncomplicated: Principal | ICD-10-CM

## 2020-11-19 LAB — COMPREHENSIVE METABOLIC PANEL
ALT: 39 U/L (ref 0–44)
AST: 26 U/L (ref 15–41)
Albumin: 1.8 g/dL — ABNORMAL LOW (ref 3.5–5.0)
Alkaline Phosphatase: 71 U/L (ref 38–126)
Anion gap: 10 (ref 5–15)
BUN: 10 mg/dL (ref 8–23)
CO2: 27 mmol/L (ref 22–32)
Calcium: 10.8 mg/dL — ABNORMAL HIGH (ref 8.9–10.3)
Chloride: 100 mmol/L (ref 98–111)
Creatinine, Ser: 0.79 mg/dL (ref 0.61–1.24)
GFR, Estimated: >60 mL/min (ref >60)
Glucose, Bld: 103 mg/dL — ABNORMAL HIGH (ref 70–99)
Potassium: 4.2 mmol/L (ref 3.5–5.1)
Sodium: 137 mmol/L (ref 135–145)
Total Bilirubin: 0.5 mg/dL (ref 0.3–1.2)
Total Protein: 7.1 g/dL (ref 6.5–8.1)

## 2020-11-19 LAB — CBC
HCT: 32.8 % — ABNORMAL LOW (ref 39.0–52.0)
HCT: 32.1 % — ABNORMAL LOW (ref 39.0–52.0)
Hemoglobin: 10.3 g/dL — ABNORMAL LOW (ref 13.0–17.0)
Hemoglobin: 10.2 g/dL — ABNORMAL LOW (ref 13.0–17.0)
MCH: 26.7 pg (ref 26.0–34.0)
MCH: 26.7 pg (ref 26.0–34.0)
MCHC: 31.4 g/dL (ref 30.0–36.0)
MCHC: 31.8 g/dL (ref 30.0–36.0)
MCV: 85 fL (ref 80.0–100.0)
MCV: 84 fL (ref 80.0–100.0)
Platelets: 370 10*3/uL (ref 150–400)
Platelets: 388 10*3/uL (ref 150–400)
RBC: 3.86 MIL/uL — ABNORMAL LOW (ref 4.22–5.81)
RBC: 3.82 MIL/uL — ABNORMAL LOW (ref 4.22–5.81)
RDW: 15.1 % (ref 11.5–15.5)
RDW: 15.1 % (ref 11.5–15.5)
WBC: 10.3 10*3/uL (ref 4.0–10.5)
WBC: 11.2 10*3/uL — ABNORMAL HIGH (ref 4.0–10.5)
nRBC: 0 % (ref 0.0–0.2)
nRBC: 0 % (ref 0.0–0.2)

## 2020-11-19 LAB — GLUCOSE, CAPILLARY
Glucose-Capillary: 95 mg/dL (ref 70–99)
Glucose-Capillary: 99 mg/dL (ref 70–99)
Glucose-Capillary: 114 mg/dL — ABNORMAL HIGH (ref 70–99)
Glucose-Capillary: 120 mg/dL — ABNORMAL HIGH (ref 70–99)

## 2020-11-19 LAB — MAGNESIUM: Magnesium: 1.3 mg/dL — ABNORMAL LOW (ref 1.7–2.4)

## 2020-11-19 MED ORDER — DIAZEPAM 2 MG PO TABS
2.0000 mg | ORAL_TABLET | Freq: Two times a day (BID) | ORAL | Status: AC
  Administered 2020-11-19 – 2020-11-20 (×2): 2 mg via ORAL
  Filled 2020-11-19 (×2): qty 1

## 2020-11-19 MED ORDER — PANTOPRAZOLE SODIUM 40 MG PO TBEC
40.0000 mg | DELAYED_RELEASE_TABLET | Freq: Every day | ORAL | Status: DC
  Administered 2020-11-19 – 2020-11-21 (×3): 40 mg via ORAL
  Filled 2020-11-19 (×3): qty 1

## 2020-11-19 MED ORDER — MAGNESIUM SULFATE 2 GM/50ML IV SOLN
2.0000 g | Freq: Once | INTRAVENOUS | Status: AC
  Administered 2020-11-19: 2 g via INTRAVENOUS
  Filled 2020-11-19: qty 50

## 2020-11-19 MED ORDER — IOHEXOL 300 MG/ML  SOLN
100.0000 mL | Freq: Once | INTRAMUSCULAR | Status: AC | PRN
  Administered 2020-11-19: 100 mL via INTRAVENOUS

## 2020-11-19 NOTE — Progress Notes (Addendum)
PROGRESS NOTE    Chad Cantu  FIE:[id number] DOB: 1951/10/18 DOA: 11/16/2020 PCP: [name], MD    Brief Narrative:  69 y.o. male with medical history significant of polysubstance abuse; HTN; HLD; and DM presenting with abdominal pain. He reported starting LLQ pain and dizziness with standing and shakes. Pt reports drinking over 9 beers daily but decided to cut back and only drank 2 beers two days prior to admission  Assessment & Plan:    ETOH dependence (severe) with withdrawal -Patient with chronic ETOH dependence -Patient reported drinking alcohol all day for the last 55 years, 9-10 drinks per day -Was started on Valium and Ativan per [facility] protocol on admission -Clinically improving cut down Valium -Counseled, TOC consult appreciated, outpatient follow-up made for [facility]  Chronic abdominal pain, weight loss -Add PPI, also check CT abdomen pelvis with contrast, r/o malignancy  Severe hypomagnesemia -Replace  Dehydration -Clinically improved with IVF hydration  HTN -Stopped taking Norvasc -BP seems stable at this time  DM -Previous A1c indicates good control -hold Glucophage -Continue SSI coverage as needed  HLD -Continue Lipitor  Tobacco dependence -Counseled, continue nicotine patch  Low-grade fever 12/7 -Covid PCR and blood cultures negative, chest x-ray and urinalysis unremarkable, monitor clinically  DVT prophylaxis: Lovenox subq Code Status: Full Family Communication: family not at bedside  Status is: Inpatient  Remains inpatient appropriate because:IV treatments appropriate due to intensity of illness or inability to take PO and Inpatient level of care appropriate due to severity of illness   Dispo: The patient is from: Home              Anticipated d/c is to: Home              Anticipated d/c date is: 2 days              Patient currently is not medically stable to d/c.    Consultants:     Procedures:      Antimicrobials: Anti-infectives (From admission, onward)   None      Subjective: -Complains of nausea, chronic intermittent abdominal pain, denies any withdrawal symptoms today  Objective: Vitals:   11/18/20 1759 11/18/20 2148 11/19/20 0503 11/19/20 1200  BP:  115/68 122/81 (!) 134/94  Pulse:  88 85 97  Resp:  18 18 20   Temp: (!) 100.9 F (38.3 C) 98.7 F (37.1 C) 99.6 F (37.6 C) 98.6 F (37 C)  TempSrc: Oral Oral Oral Oral  SpO2:  98% 100% 100%  Weight:      Height:        Intake/Output Summary (Last 24 hours) at 11/19/2020 1259 Last data filed at 11/19/2020 1000 Gross per 24 hour  Intake 223 ml  Output 1835 ml  Net -1612 ml   Filed Weights   11/16/20 0925  Weight: 72.6 kg    Examination: General exam: Chronically ill pleasant male laying in bed, AAOx3, no distress HEENT: No JVD CVS: S1-S2, regular rate rhythm Abdomen: Soft, mildly distended, nontender, bowel sounds present Extremities: No edema Skin: No rashes on exposed skin  Data Reviewed: I have personally reviewed following labs and imaging studies  CBC: Recent Labs  Lab 11/16/20 0936 11/17/20 0210 11/19/20 0340  WBC 14.5* 11.1* 10.3  HGB 12.3* 10.0* 10.3*  HCT 39.4 30.0* 32.8*  MCV 86.0 82.9 85.0  PLT 485* 399 166   Basic Metabolic Panel: Recent Labs  Lab 11/16/20 0936 11/16/20 1017 11/17/20 0210 11/18/20 0117 11/19/20 0340  NA 132*  --  134* 136 137  K 3.7  --  3.4* 4.5 4.2  CL 94*  --  95* 98 100  CO2 22  --  29 28 27   GLUCOSE 149*  --  110* 95 103*  BUN 12  --  11 7* 10  CREATININE 1.07  --  0.76 0.77 0.79  CALCIUM 10.7*  --  10.0 10.9* 10.8*  MG  --  1.9  --  1.3* 1.3*   GFR: Estimated Creatinine Clearance: 80.9 mL/min (by C-G formula based on SCr of 0.79 mg/dL). Liver Function Tests: Recent Labs  Lab 11/16/20 0936 11/18/20 0117 11/19/20 0340  AST 30 39 26  ALT 28 48* 39  ALKPHOS 99 80 71  BILITOT 0.7 0.5 0.5  PROT 8.5* 7.2 7.1  ALBUMIN 2.1* 1.9* 1.8*    Recent Labs  Lab 11/16/20 0936  LIPASE 35   No results for input(s): AMMONIA in the last 168 hours. Coagulation Profile: No results for input(s): INR, PROTIME in the last 168 hours. Cardiac Enzymes: No results for input(s): CKTOTAL, CKMB, CKMBINDEX, TROPONINI in the last 168 hours. BNP (last 3 results) No results for input(s): PROBNP in the last 8760 hours. HbA1C: No results for input(s): HGBA1C in the last 72 hours. CBG: Recent Labs  Lab 11/18/20 1216 11/18/20 1652 11/18/20 2013 11/19/20 0752 11/19/20 1226  GLUCAP 153* 135* 129* 95 99   Lipid Profile: No results for input(s): CHOL, HDL, LDLCALC, TRIG, CHOLHDL, LDLDIRECT in the last 72 hours. Thyroid Function Tests: No results for input(s): TSH, T4TOTAL, FREET4, T3FREE, THYROIDAB in the last 72 hours. Anemia Panel: No results for input(s): VITAMINB12, FOLATE, FERRITIN, TIBC, IRON, RETICCTPCT in the last 72 hours. Sepsis Labs: No results for input(s): PROCALCITON, LATICACIDVEN in the last 168 hours.  Recent Results (from the past 240 hour(s))  Resp Panel by RT-PCR (Flu A&B, Covid) Nasopharyngeal Swab     Status: None   Collection Time: 11/16/20 12:56 PM   Specimen: Nasopharyngeal Swab; Nasopharyngeal(NP) swabs in vial transport medium  Result Value Ref Range Status   SARS Coronavirus 2 by RT PCR NEGATIVE NEGATIVE Final    Comment: (NOTE) SARS-CoV-2 target nucleic acids are NOT DETECTED.  The SARS-CoV-2 RNA is generally detectable in upper respiratory specimens during the acute phase of infection. The lowest concentration of SARS-CoV-2 viral copies this assay can detect is 138 copies/mL. A negative result does not preclude SARS-Cov-2 infection and should not be used as the sole basis for treatment or other patient management decisions. A negative result may occur with  improper specimen collection/handling, submission of specimen other than nasopharyngeal swab, presence of viral mutation(s) within the areas  targeted by this assay, and inadequate number of viral copies(<138 copies/mL). A negative result must be combined with clinical observations, patient history, and epidemiological information. The expected result is Negative.  Fact Sheet for Patients:  EntrepreneurPulse.com.au  Fact Sheet for Healthcare Providers:  IncredibleEmployment.be  This test is no t yet approved or cleared by the [place] FDA and  has been authorized for detection and/or diagnosis of SARS-CoV-2 by FDA under an Emergency Use Authorization (EUA). This EUA will remain  in effect (meaning this test can be used) for the duration of the COVID-19 declaration under Section 564(b)(1) of the Act, 21 U.S.C.section 360bbb-3(b)(1), unless the authorization is terminated  or revoked sooner.       Influenza A by PCR NEGATIVE NEGATIVE Final   Influenza B by PCR NEGATIVE NEGATIVE Final    Comment: (NOTE) The Xpert Xpress  SARS-CoV-2/FLU/RSV plus assay is intended as an aid in the diagnosis of influenza from Nasopharyngeal swab specimens and should not be used as a sole basis for treatment. Nasal washings and aspirates are unacceptable for Xpert Xpress SARS-CoV-2/FLU/RSV testing.  Fact Sheet for Patients: EntrepreneurPulse.com.au  Fact Sheet for Healthcare Providers: IncredibleEmployment.be  This test is not yet approved or cleared by the [place] FDA and has been authorized for detection and/or diagnosis of SARS-CoV-2 by FDA under an Emergency Use Authorization (EUA). This EUA will remain in effect (meaning this test can be used) for the duration of the COVID-19 declaration under Section 564(b)(1) of the Act, 21 U.S.C. section 360bbb-3(b)(1), unless the authorization is terminated or revoked.  Performed at [facility], [place] [address] [place], [place] [address]   Culture, blood (routine x 2)     Status: None (Preliminary result)    Collection Time: 11/18/20  4:12 PM   Specimen: BLOOD LEFT HAND  Result Value Ref Range Status   Specimen Description BLOOD LEFT HAND  Final   Special Requests   Final    BOTTLES DRAWN AEROBIC AND ANAEROBIC Blood Culture adequate volume   Culture   Final    NO GROWTH < 24 HOURS Performed at [facility], [place] [address] [place], [place] [address]    Report Status PENDING  Incomplete  Culture, blood (routine x 2)     Status: None (Preliminary result)   Collection Time: 11/18/20  4:17 PM   Specimen: BLOOD  Result Value Ref Range Status   Specimen Description BLOOD RIGHT ANTECUBITAL  Final   Special Requests   Final    BOTTLES DRAWN AEROBIC AND ANAEROBIC Blood Culture results may not be optimal due to an inadequate volume of blood received in culture bottles   Culture   Final    NO GROWTH < 24 HOURS Performed at [facility], [place] [address] [place], [place] [address]    Report Status PENDING  Incomplete     Radiology Studies: DG CHEST PORT 1 VIEW  Result Date: 11/18/2020 CLINICAL DATA:  Fever. EXAM: PORTABLE CHEST 1 VIEW COMPARISON:  May 04, 2016 FINDINGS: The heart size and mediastinal contours are within normal limits. There is moderate severity calcification of the aortic arch. Both lungs are clear. The visualized skeletal structures are unremarkable. IMPRESSION: No active disease. Electronically Signed   By: Virgina Norfolk M.D.   On: 11/18/2020 17:14    Scheduled Meds: . atorvastatin  20 mg Oral Daily  . diazepam  5 mg Oral Q6H  . docusate sodium  100 mg Oral BID  . enoxaparin (LOVENOX) injection  40 mg Subcutaneous Q24H  . feeding supplement  237 mL Oral BID BM  . folic acid  1 mg Oral Daily  . insulin aspart  0-9 Units Subcutaneous TID WC  . LORazepam  0-4 mg Intravenous Q12H  . multivitamin with minerals  1 tablet Oral Daily  . nicotine  21 mg Transdermal Daily  . pantoprazole  40 mg Oral Q1200  . sodium chloride flush  3 mL Intravenous Q12H  .  thiamine  100 mg Oral Daily   Or  . thiamine  100 mg Intravenous Daily   Continuous Infusions:    LOS: 3 days   Domenic Polite, MD [facility] 11/19/2020, 12:59 PM

## 2020-11-20 LAB — CBC
HCT: 33 % — ABNORMAL LOW (ref 39.0–52.0)
Hemoglobin: 10.4 g/dL — ABNORMAL LOW (ref 13.0–17.0)
MCH: 26.5 pg (ref 26.0–34.0)
MCHC: 31.5 g/dL (ref 30.0–36.0)
MCV: 84 fL (ref 80.0–100.0)
Platelets: 362 10*3/uL (ref 150–400)
RBC: 3.93 MIL/uL — ABNORMAL LOW (ref 4.22–5.81)
RDW: 14.9 % (ref 11.5–15.5)
WBC: 9.8 10*3/uL (ref 4.0–10.5)
nRBC: 0 % (ref 0.0–0.2)

## 2020-11-20 LAB — GLUCOSE, CAPILLARY
Glucose-Capillary: 123 mg/dL — ABNORMAL HIGH (ref 70–99)
Glucose-Capillary: 82 mg/dL (ref 70–99)
Glucose-Capillary: 168 mg/dL — ABNORMAL HIGH (ref 70–99)
Glucose-Capillary: 94 mg/dL (ref 70–99)

## 2020-11-20 LAB — PROTIME-INR
INR: 1.2 (ref 0.8–1.2)
Prothrombin Time: 14.5 seconds (ref 11.4–15.2)

## 2020-11-20 LAB — COMPREHENSIVE METABOLIC PANEL
ALT: 36 U/L (ref 0–44)
AST: 25 U/L (ref 15–41)
Albumin: 1.7 g/dL — ABNORMAL LOW (ref 3.5–5.0)
Alkaline Phosphatase: 72 U/L (ref 38–126)
Anion gap: 11 (ref 5–15)
BUN: 8 mg/dL (ref 8–23)
CO2: 26 mmol/L (ref 22–32)
Calcium: 10.6 mg/dL — ABNORMAL HIGH (ref 8.9–10.3)
Chloride: 97 mmol/L — ABNORMAL LOW (ref 98–111)
Creatinine, Ser: 0.8 mg/dL (ref 0.61–1.24)
GFR, Estimated: >60 mL/min (ref >60)
Glucose, Bld: 122 mg/dL — ABNORMAL HIGH (ref 70–99)
Potassium: 4 mmol/L (ref 3.5–5.1)
Sodium: 134 mmol/L — ABNORMAL LOW (ref 135–145)
Total Bilirubin: 0.5 mg/dL (ref 0.3–1.2)
Total Protein: 7.3 g/dL (ref 6.5–8.1)

## 2020-11-20 NOTE — Consult Note (Addendum)
Urology Consult   Physician requesting consult: Domenic Polite, MD  Reason for consult: Left renal mass  History of Present Illness: Chad Cantu is a 70 y.o. with past medical history of polysubstance abuse he reports drinking 9 beers daily, hypertension, hyperlipidemia, diabetes on Metformin who presented with left lower quadrant abdominal pain, dizziness and tremors. He was noted to be with EtOH dependence and withdrawal is on a CIWA protocol.  CT A/P 11/19/2020 demonstrated a large LEFT lower pole renal mass with adjacent subacute hematoma. Mass measures approximately 5.7 x 5.6 x 5.4 cm with perinephric inflammatory fat stranding and approximately 5 x 5 x 4 cm area of adjacent subacute hematoma. There is no evidence for any active bleeding. Hemoglobin 11/20/2020 was stable at 10.4.  CT A/P above also demonstrated a low attenuating RIGHT adrenal mass likely consistent with an adrenal adenoma. This measured to be 1.2 cm x 0.9 cm.  CT A/P above also demonstrated a hepatic hemangioma versus a solitary metastatic lesion measuring 1.3 cm x 0.9 cm in the posterior aspect of the right lobe of liver.  He denies significant left flank pain. He denies prior history of malignancy or family history of urologic malignancy. He does have a smoking history and alcohol abuse as above. He reports drinking 9 beers daily. He does state he has a history of diabetes however this is only managed with Metformin and his glucose seems to be well-controlled this hospital admission. He is not on insulin. His renal function is normal with creatinine of 0.8 on 11/20/2020.  He denies significant bothersome voiding complaints. He has not had a PSA obtained. He has no history of prostate cancer.  He states he does have a history of laparoscopic gastrotomy with repair of an ulcer he states in the 1980s.  He denies a history of voiding or storage urinary symptoms, hematuria, UTIs, STDs, urolithiasis, GU  malignancy/trauma/surgery.  Past Medical History:  Diagnosis Date  . Diabetes mellitus without complication ([place])   . ETOH abuse   . Hyperlipidemia   . Hypertension   . Substance abuse ([place])    ETOH, cocaine.  Remission    Past Surgical History:  Procedure Laterality Date  . COLONOSCOPY  5 years ago?   at [facility] maybe per pt,?unsure results  . LAPAROSCOPIC GASTROTOMY W/ REPAIR OF ULCER      Medications:  Home meds:  No current facility-administered medications on file prior to encounter.   Current Outpatient Medications on File Prior to Encounter  Medication Sig Dispense Refill  . Ascorbic Acid (VITAMIN C PO) Take by mouth.    Marland Kitchen atorvastatin (LIPITOR) 20 MG tablet Take 1 tablet (20 mg total) by mouth daily. 90 tablet 3  . metFORMIN (GLUCOPHAGE) 500 MG tablet Take 1 tablet (500 mg total) by mouth daily with breakfast. 90 tablet 2  . [DISCONTINUED] lisinopril (ZESTRIL) 20 MG tablet Take 20 mg by mouth daily.       Scheduled Meds: . atorvastatin  20 mg Oral Daily  . docusate sodium  100 mg Oral BID  . feeding supplement  237 mL Oral BID BM  . folic acid  1 mg Oral Daily  . insulin aspart  0-9 Units Subcutaneous TID WC  . multivitamin with minerals  1 tablet Oral Daily  . nicotine  21 mg Transdermal Daily  . pantoprazole  40 mg Oral Q1200  . sodium chloride flush  3 mL Intravenous Q12H  . thiamine  100 mg Oral Daily   Or  . thiamine  100 mg Intravenous Daily   Continuous Infusions: PRN Meds:.acetaminophen **OR** acetaminophen, albuterol, bisacodyl, hydrALAZINE, HYDROcodone-acetaminophen, ondansetron **OR** ondansetron (ZOFRAN) IV, polyethylene glycol  Allergies: No Known Allergies  Family History  Problem Relation Age of Onset  . Diabetes Mother   . Colon cancer Neg Hx   . Colon polyps Neg Hx   . Esophageal cancer Neg Hx   . Rectal cancer Neg Hx   . Stomach cancer Neg Hx     Social History:  reports that he has been smoking cigarettes. He has a 110.00  pack-year smoking history. He has never used smokeless tobacco. He reports current alcohol use of about 12.0 standard drinks of alcohol per week. He reports current drug use. Drug: Marijuana.  ROS: A complete review of systems was performed.  All systems are negative except for pertinent findings as noted.  Physical Exam:  Vital signs in last 24 hours: Temp:  [98 F (36.7 C)-100.4 F (38 C)] 100.3 F (37.9 C) (12/09 1613) Pulse Rate:  [77-122] 96 (12/09 1613) Resp:  [18-20] 18 (12/09 1613) BP: (113-145)/(70-86) 113/73 (12/09 1613) SpO2:  [97 %-98 %] 97 % (12/09 1613) Constitutional:  Alert and oriented, No acute distress Cardiovascular: Regular rate and rhythm Respiratory: Normal respiratory effort, Lungs clear bilaterally GI: Abdomen is soft, nontender, nondistended, no abdominal masses; well healed midline scar from prior ex-lap Rectal: Normal sphincter tone, no rectal masses, prostate is non tender and without nodularity. Prostate size is estimated to be 60 cc Neurologic: Grossly intact, no focal deficits Psychiatric: Normal mood and affect  Laboratory Data:  Recent Labs    11/19/20 0340 11/19/20 1643 11/20/20 0245  WBC 10.3 11.2* 9.8  HGB 10.3* 10.2* 10.4*  HCT 32.8* 32.1* 33.0*  PLT 370 388 362    Recent Labs    11/18/20 0117 11/19/20 0340 11/20/20 0245  NA 136 137 134*  K 4.5 4.2 4.0  CL 98 100 97*  GLUCOSE 95 103* 122*  BUN 7* 10 8  CALCIUM 10.9* 10.8* 10.6*  CREATININE 0.77 0.79 0.80     Results for orders placed or performed during the hospital encounter of 11/16/20 (from the past 24 hour(s))  Glucose, capillary     Status: Abnormal   Collection Time: 11/19/20  9:48 PM  Result Value Ref Range   Glucose-Capillary 120 (H) 70 - 99 mg/dL  CBC     Status: Abnormal   Collection Time: 11/20/20  2:45 AM  Result Value Ref Range   WBC 9.8 4.0 - 10.5 K/uL   RBC 3.93 (L) 4.22 - 5.81 MIL/uL   Hemoglobin 10.4 (L) 13.0 - 17.0 g/dL   HCT 33.0 (L) 39.0 - 52.0 %    MCV 84.0 80.0 - 100.0 fL   MCH 26.5 26.0 - 34.0 pg   MCHC 31.5 30.0 - 36.0 g/dL   RDW 14.9 11.5 - 15.5 %   Platelets 362 150 - 400 K/uL   nRBC 0.0 0.0 - 0.2 %  Comprehensive metabolic panel     Status: Abnormal   Collection Time: 11/20/20  2:45 AM  Result Value Ref Range   Sodium 134 (L) 135 - 145 mmol/L   Potassium 4.0 3.5 - 5.1 mmol/L   Chloride 97 (L) 98 - 111 mmol/L   CO2 26 22 - 32 mmol/L   Glucose, Bld 122 (H) 70 - 99 mg/dL   BUN 8 8 - 23 mg/dL   Creatinine, Ser 0.80 0.61 - 1.24 mg/dL   Calcium 10.6 (H) 8.9 - 10.3 mg/dL  Total Protein 7.3 6.5 - 8.1 g/dL   Albumin 1.7 (L) 3.5 - 5.0 g/dL   AST 25 15 - 41 U/L   ALT 36 0 - 44 U/L   Alkaline Phosphatase 72 38 - 126 U/L   Total Bilirubin 0.5 0.3 - 1.2 mg/dL   GFR, Estimated >60 >60 mL/min   Anion gap 11 5 - 15  Protime-INR     Status: None   Collection Time: 11/20/20  2:45 AM  Result Value Ref Range   Prothrombin Time 14.5 11.4 - 15.2 seconds   INR 1.2 0.8 - 1.2  Glucose, capillary     Status: Abnormal   Collection Time: 11/20/20  7:59 AM  Result Value Ref Range   Glucose-Capillary 123 (H) 70 - 99 mg/dL  Glucose, capillary     Status: None   Collection Time: 11/20/20 12:10 PM  Result Value Ref Range   Glucose-Capillary 82 70 - 99 mg/dL  Glucose, capillary     Status: Abnormal   Collection Time: 11/20/20  4:30 PM  Result Value Ref Range   Glucose-Capillary 168 (H) 70 - 99 mg/dL   Recent Results (from the past 240 hour(s))  Resp Panel by RT-PCR (Flu A&B, Covid) Nasopharyngeal Swab     Status: None   Collection Time: 11/16/20 12:56 PM   Specimen: Nasopharyngeal Swab; Nasopharyngeal(NP) swabs in vial transport medium  Result Value Ref Range Status   SARS Coronavirus 2 by RT PCR NEGATIVE NEGATIVE Final    Comment: (NOTE) SARS-CoV-2 target nucleic acids are NOT DETECTED.  The SARS-CoV-2 RNA is generally detectable in upper respiratory specimens during the acute phase of infection. The lowest concentration of  SARS-CoV-2 viral copies this assay can detect is 138 copies/mL. A negative result does not preclude SARS-Cov-2 infection and should not be used as the sole basis for treatment or other patient management decisions. A negative result may occur with  improper specimen collection/handling, submission of specimen other than nasopharyngeal swab, presence of viral mutation(s) within the areas targeted by this assay, and inadequate number of viral copies(<138 copies/mL). A negative result must be combined with clinical observations, patient history, and epidemiological information. The expected result is Negative.  Fact Sheet for Patients:  EntrepreneurPulse.com.au  Fact Sheet for Healthcare Providers:  IncredibleEmployment.be  This test is no t yet approved or cleared by the [place] FDA and  has been authorized for detection and/or diagnosis of SARS-CoV-2 by FDA under an Emergency Use Authorization (EUA). This EUA will remain  in effect (meaning this test can be used) for the duration of the COVID-19 declaration under Section 564(b)(1) of the Act, 21 U.S.C.section 360bbb-3(b)(1), unless the authorization is terminated  or revoked sooner.       Influenza A by PCR NEGATIVE NEGATIVE Final   Influenza B by PCR NEGATIVE NEGATIVE Final    Comment: (NOTE) The Xpert Xpress SARS-CoV-2/FLU/RSV plus assay is intended as an aid in the diagnosis of influenza from Nasopharyngeal swab specimens and should not be used as a sole basis for treatment. Nasal washings and aspirates are unacceptable for Xpert Xpress SARS-CoV-2/FLU/RSV testing.  Fact Sheet for Patients: EntrepreneurPulse.com.au  Fact Sheet for Healthcare Providers: IncredibleEmployment.be  This test is not yet approved or cleared by the [place] FDA and has been authorized for detection and/or diagnosis of SARS-CoV-2 by FDA under an Emergency Use  Authorization (EUA). This EUA will remain in effect (meaning this test can be used) for the duration of the COVID-19 declaration under Section 564(b)(1) of  the Act, 21 U.S.C. section 360bbb-3(b)(1), unless the authorization is terminated or revoked.  Performed at [facility], [place] [address] [place], [place] [address]   Culture, blood (routine x 2)     Status: None (Preliminary result)   Collection Time: 11/18/20  4:12 PM   Specimen: BLOOD LEFT HAND  Result Value Ref Range Status   Specimen Description BLOOD LEFT HAND  Final   Special Requests   Final    BOTTLES DRAWN AEROBIC AND ANAEROBIC Blood Culture adequate volume   Culture   Final    NO GROWTH 2 DAYS Performed at [facility], [place] [address], [place], [place] [address]    Report Status PENDING  Incomplete  Culture, blood (routine x 2)     Status: None (Preliminary result)   Collection Time: 11/18/20  4:17 PM   Specimen: BLOOD  Result Value Ref Range Status   Specimen Description BLOOD RIGHT ANTECUBITAL  Final   Special Requests   Final    BOTTLES DRAWN AEROBIC AND ANAEROBIC Blood Culture results may not be optimal due to an inadequate volume of blood received in culture bottles   Culture   Final    NO GROWTH 2 DAYS Performed at [facility], [place] [address], [place], [place] [address]    Report Status PENDING  Incomplete    Renal Function: Recent Labs    11/16/20 0936 11/17/20 0210 11/18/20 0117 11/19/20 0340 11/20/20 0245  CREATININE 1.07 0.76 0.77 0.79 0.80   Estimated Creatinine Clearance: 80.9 mL/min (by C-G formula based on SCr of 0.8 mg/dL).  Radiologic Imaging: CT ABDOMEN PELVIS W CONTRAST  Result Date: 11/19/2020 CLINICAL DATA:  Chronic abdominal pain and weight loss. EXAM: CT ABDOMEN AND PELVIS WITH CONTRAST TECHNIQUE: Multidetector CT imaging of the abdomen and pelvis was performed using the standard protocol following bolus administration of intravenous contrast. CONTRAST:  132mL  OMNIPAQUE IOHEXOL 300 MG/ML  SOLN COMPARISON:  None. FINDINGS: Lower chest: A trace amount of atelectasis is seen within the posterior aspect of the bilateral lung bases. Very small bilateral pleural effusions are noted. Hepatobiliary: A 1.3 cm x 0.9 cm focus of ill-defined low attenuation, with faint surrounding hyperdense rim, is seen within the posterior aspect of the right lobe of the liver. No gallstones, gallbladder wall thickening, or biliary dilatation. Pancreas: Unremarkable. No pancreatic ductal dilatation or surrounding inflammatory changes. Spleen: Normal in size without focal abnormality. Adrenals/Urinary Tract: A 1.2 cm x 0.9 cm low-attenuation right adrenal mass is seen. The left adrenal gland is normal in appearance. Kidneys are normal in size, without obstructing renal calculi or hydronephrosis. A 2 mm nonobstructing renal stone is seen within the mid left kidney. A 5.7 cm x 5.6 cm x 5.4 cm area of heterogeneous low attenuation is seen within the lower pole of the left kidney. A moderate amount of associated perinephric inflammatory fat stranding is seen. An adjacent 5.5 cm x 4.7 cm x 3.6 cm area of mildly increased attenuation is seen adjacent to the lower pole of the left kidney. The urinary bladder is unremarkable. Stomach/Bowel: Stomach is within normal limits. Appendix appears normal. Surgically anastomosed bowel is seen within the anterior aspect of the mid abdomen. No evidence of bowel wall thickening, distention, or inflammatory changes. Vascular/Lymphatic: Aortic atherosclerosis. No enlarged abdominal or pelvic lymph nodes. Reproductive: The prostate gland is moderately enlarged. Other: No abdominal wall hernia or abnormality. No abdominopelvic ascites. Musculoskeletal: There is approximately 3 mm retrolisthesis of the L5 vertebral  body on S1. IMPRESSION: 1. Findings consistent with a large mass within the lower pole of the left kidney, with an adjacent subacute hematoma. While no evidence  of active bleeding is identified, MRI correlation is recommended to further confirm the presence of an underlying neoplasm. 2. 2 mm nonobstructing renal stone within the left kidney. 3. Low-attenuation right adrenal mass likely consistent with an adrenal adenoma. 4. Very small bilateral pleural effusions. 5. Findings which may represent a hepatic hemangioma versus solitary metastatic lesion. MRI correlation is again recommended. Aortic Atherosclerosis (ICD10-I70.0). Electronically Signed   By: Virgina Norfolk M.D.   On: 11/19/2020 16:13    I independently reviewed the above imaging studies.  Impression/Recommendation 1. LEFT renal mass with adjacent subacute hematoma: CT A/P 11/19/2020 demonstrated a large left lower pole renal mass with adjacent subacute hematoma. Mass measures approximately 5.7 x 5.6 x 5.4 cm with perinephric inflammatory fat stranding and approximately 5 x 5 x 4 cm area of adjacent subacute hematoma. There is no evidence for any active bleeding. Hemoglobin 11/20/2020 was stable at 10.4. 2. RIGHT adrenal mass: Likely adrenal adenoma. CT A/P 11/19/2020 with 1.2 cm x 0.9 cm right adrenal mass. 3. Hepatic hemangioma versus single solitary metastatic lesion: CT A/P above also demonstrated a hepatic hemangioma versus a solitary metastatic lesion measuring 1.3 cm x 0.9 cm in the posterior aspect of the right lobe of liver. 4. Polysubstance abuse: Drinks 9 beers daily 5. DM: On Metformin, no insulin. Creatinine 0.8 on 11/20/2020  -Reviewed CT A/P 11/19/2020 with large left lower pole renal mass with adjacent subacute hematoma. No evidence of active bleeding. Hemoglobin is been stable. No need for surgical intervention this hospital admission. -We will obtain MRI to further evaluate left lower pole renal mass as well as liver lesion which is either a hepatic hemangioma versus a single solitary metastatic lesion. -Obtain adrenal labs to evaluate for right adrenal mass -Discussed surgical  options including robotic assisted laparoscopic left partial nephrectomy versus radical nephrectomy versus an open procedure. Will obtain MRI prior to making these decisions. -We will arrange for follow-up outpatient with [facility] to further discuss -Following peripherally. Please call with questions.  Matt R. Euclide Granito MD 11/20/2020, 5:07 PM  [facility]  Pager: [phone]  CC: Domenic Polite, MD

## 2020-11-20 NOTE — Progress Notes (Signed)
PROGRESS NOTE    Jordyn Doane  CVE:[id number] DOB: [birth year] DOA: 11/16/2020 PCP: [name], MD    Brief Narrative:  69 y.o. male with medical history significant of polysubstance abuse; HTN; HLD; and DM presenting with abdominal pain. He reported starting LLQ pain and dizziness with standing and shakes. Pt reports drinking over 9 beers daily but decided to cut back and only drank 2 beers two days prior to admission  Assessment & Plan:    ETOH dependence (severe) with withdrawal -chronic ETOH dependence, reported drinking alcohol all day for the last 55 years, 9-10 drinks per day -Was started on Valium and Ativan per [facility] protocol on admission -Improving clinically, Valium dose decreased -Counseled, TOC consult appreciated, outpatient follow-up made for [facility] [facility]  Left renal mass Perinephric subacute hematoma -Given ongoing weight loss, liver and adrenal lesion concern for possible malignancy -Hemoglobin is stable, no symptoms suggestive of acute bleed at this time, avoid anticoagulants and antiplatelet agents -Urology consulted  Severe hypomagnesemia -Replaced  Dehydration -Clinically improved with hydration  HTN -Stopped taking Norvasc -BP seems stable at this time  DM -Previous A1c indicates good control -hold Glucophage -Continue SSI coverage as needed  HLD -Continue Lipitor  Tobacco dependence -Counseled, continue nicotine patch  Low-grade fever 12/7 -Covid PCR and blood cultures negative, chest x-ray and urinalysis unremarkable, monitor clinically  DVT prophylaxis: SCDs, Lovenox discontinued due to large perinephric hematoma Code Status: Full Family Communication: family not at bedside  Status is: Inpatient  Remains inpatient appropriate because:IV treatments appropriate due to intensity of illness or inability to take PO and Inpatient level of care appropriate due to severity of illness   Dispo: The patient is  from: Home              Anticipated d/c is to: Home              Anticipated d/c date is: 48 hours if stable              Patient currently is not medically stable to d/c.    Consultants:     Procedures:     Antimicrobials: Anti-infectives (From admission, onward)   None      Subjective: -Feels okay overall, mild shakes, denies any abdominal discomfort today, no nausea or vomiting  Objective: Vitals:   11/19/20 1200 11/19/20 1600 11/19/20 2100 11/20/20 0500  BP: (!) 134/94  123/86 (!) 145/70  Pulse: 97  98 77  Resp: 20  19 20   Temp: 98.6 F (37 C) (!) 100.6 F (38.1 C) 99.1 F (37.3 C) 98 F (36.7 C)  TempSrc: Oral Oral Oral Oral  SpO2: 100%     Weight:      Height:        Intake/Output Summary (Last 24 hours) at 11/20/2020 1332 Last data filed at 11/20/2020 0800 Gross per 24 hour  Intake --  Output 1700 ml  Net -1700 ml   Filed Weights   11/16/20 0925  Weight: 72.6 kg    Examination: General exam: Chronically ill pleasant male laying in bed, AAOx3, no distress HEENT: No JVD CVS: S1-S2, regular rate rhythm Abdomen: Soft, mildly distended, nontender, bowel sounds present Extremities: No edema Skin: No rashes on exposed skin   Data Reviewed: I have personally reviewed following labs and imaging studies  CBC: Recent Labs  Lab 11/16/20 0936 11/17/20 0210 11/19/20 0340 11/19/20 1643 11/20/20 0245  WBC 14.5* 11.1* 10.3 11.2* 9.8  HGB 12.3* 10.0* 10.3* 10.2* 10.4*  HCT 39.4 30.0* 32.8* 32.1* 33.0*  MCV 86.0 82.9 85.0 84.0 84.0  PLT 485* 399 370 388 637   Basic Metabolic Panel: Recent Labs  Lab 11/16/20 0936 11/16/20 1017 11/17/20 0210 11/18/20 0117 11/19/20 0340 11/20/20 0245  NA 132*  --  134* 136 137 134*  K 3.7  --  3.4* 4.5 4.2 4.0  CL 94*  --  95* 98 100 97*  CO2 22  --  29 28 27 26   GLUCOSE 149*  --  110* 95 103* 122*  BUN 12  --  11 7* 10 8  CREATININE 1.07  --  0.76 0.77 0.79 0.80  CALCIUM 10.7*  --  10.0 10.9* 10.8* 10.6*   MG  --  1.9  --  1.3* 1.3*  --    GFR: Estimated Creatinine Clearance: 80.9 mL/min (by C-G formula based on SCr of 0.8 mg/dL). Liver Function Tests: Recent Labs  Lab 11/16/20 0936 11/18/20 0117 11/19/20 0340 11/20/20 0245  AST 30 39 26 25  ALT 28 48* 39 36  ALKPHOS 99 80 71 72  BILITOT 0.7 0.5 0.5 0.5  PROT 8.5* 7.2 7.1 7.3  ALBUMIN 2.1* 1.9* 1.8* 1.7*   Recent Labs  Lab 11/16/20 0936  LIPASE 35   No results for input(s): AMMONIA in the last 168 hours. Coagulation Profile: Recent Labs  Lab 11/20/20 0245  INR 1.2   Cardiac Enzymes: No results for input(s): CKTOTAL, CKMB, CKMBINDEX, TROPONINI in the last 168 hours. BNP (last 3 results) No results for input(s): PROBNP in the last 8760 hours. HbA1C: No results for input(s): HGBA1C in the last 72 hours. CBG: Recent Labs  Lab 11/19/20 1226 11/19/20 1631 11/19/20 2148 11/20/20 0759 11/20/20 1210  GLUCAP 99 114* 120* 123* 82   Lipid Profile: No results for input(s): CHOL, HDL, LDLCALC, TRIG, CHOLHDL, LDLDIRECT in the last 72 hours. Thyroid Function Tests: No results for input(s): TSH, T4TOTAL, FREET4, T3FREE, THYROIDAB in the last 72 hours. Anemia Panel: No results for input(s): VITAMINB12, FOLATE, FERRITIN, TIBC, IRON, RETICCTPCT in the last 72 hours. Sepsis Labs: No results for input(s): PROCALCITON, LATICACIDVEN in the last 168 hours.  Recent Results (from the past 240 hour(s))  Resp Panel by RT-PCR (Flu A&B, Covid) Nasopharyngeal Swab     Status: None   Collection Time: 11/16/20 12:56 PM   Specimen: Nasopharyngeal Swab; Nasopharyngeal(NP) swabs in vial transport medium  Result Value Ref Range Status   SARS Coronavirus 2 by RT PCR NEGATIVE NEGATIVE Final    Comment: (NOTE) SARS-CoV-2 target nucleic acids are NOT DETECTED.  The SARS-CoV-2 RNA is generally detectable in upper respiratory specimens during the acute phase of infection. The lowest concentration of SARS-CoV-2 viral copies this assay can detect  is 138 copies/mL. A negative result does not preclude SARS-Cov-2 infection and should not be used as the sole basis for treatment or other patient management decisions. A negative result may occur with  improper specimen collection/handling, submission of specimen other than nasopharyngeal swab, presence of viral mutation(s) within the areas targeted by this assay, and inadequate number of viral copies(<138 copies/mL). A negative result must be combined with clinical observations, patient history, and epidemiological information. The expected result is Negative.  Fact Sheet for Patients:  EntrepreneurPulse.com.au  Fact Sheet for Healthcare Providers:  IncredibleEmployment.be  This test is no t yet approved or cleared by the [place] FDA and  has been authorized for detection and/or diagnosis of SARS-CoV-2 by FDA under an Emergency Use Authorization (EUA). This EUA  will remain  in effect (meaning this test can be used) for the duration of the COVID-19 declaration under Section 564(b)(1) of the Act, 21 U.S.C.section 360bbb-3(b)(1), unless the authorization is terminated  or revoked sooner.       Influenza A by PCR NEGATIVE NEGATIVE Final   Influenza B by PCR NEGATIVE NEGATIVE Final    Comment: (NOTE) The Xpert Xpress SARS-CoV-2/FLU/RSV plus assay is intended as an aid in the diagnosis of influenza from Nasopharyngeal swab specimens and should not be used as a sole basis for treatment. Nasal washings and aspirates are unacceptable for Xpert Xpress SARS-CoV-2/FLU/RSV testing.  Fact Sheet for Patients: EntrepreneurPulse.com.au  Fact Sheet for Healthcare Providers: IncredibleEmployment.be  This test is not yet approved or cleared by the [place] FDA and has been authorized for detection and/or diagnosis of SARS-CoV-2 by FDA under an Emergency Use Authorization (EUA). This EUA will remain in effect  (meaning this test can be used) for the duration of the COVID-19 declaration under Section 564(b)(1) of the Act, 21 U.S.C. section 360bbb-3(b)(1), unless the authorization is terminated or revoked.  Performed at [facility], [place] [address] [place], [place] [address]   Culture, blood (routine x 2)     Status: None (Preliminary result)   Collection Time: 11/18/20  4:12 PM   Specimen: BLOOD LEFT HAND  Result Value Ref Range Status   Specimen Description BLOOD LEFT HAND  Final   Special Requests   Final    BOTTLES DRAWN AEROBIC AND ANAEROBIC Blood Culture adequate volume   Culture   Final    NO GROWTH 2 DAYS Performed at [facility], [place] [address] [place], [place] [address]    Report Status PENDING  Incomplete  Culture, blood (routine x 2)     Status: None (Preliminary result)   Collection Time: 11/18/20  4:17 PM   Specimen: BLOOD  Result Value Ref Range Status   Specimen Description BLOOD RIGHT ANTECUBITAL  Final   Special Requests   Final    BOTTLES DRAWN AEROBIC AND ANAEROBIC Blood Culture results may not be optimal due to an inadequate volume of blood received in culture bottles   Culture   Final    NO GROWTH 2 DAYS Performed at [facility], [place] [address], [place], [place] [address]    Report Status PENDING  Incomplete     Radiology Studies: CT ABDOMEN PELVIS W CONTRAST  Result Date: 11/19/2020 CLINICAL DATA:  Chronic abdominal pain and weight loss. EXAM: CT ABDOMEN AND PELVIS WITH CONTRAST TECHNIQUE: Multidetector CT imaging of the abdomen and pelvis was performed using the standard protocol following bolus administration of intravenous contrast. CONTRAST:  110mL OMNIPAQUE IOHEXOL 300 MG/ML  SOLN COMPARISON:  None. FINDINGS: Lower chest: A trace amount of atelectasis is seen within the posterior aspect of the bilateral lung bases. Very small bilateral pleural effusions are noted. Hepatobiliary: A 1.3 cm x 0.9 cm focus of ill-defined low attenuation, with  faint surrounding hyperdense rim, is seen within the posterior aspect of the right lobe of the liver. No gallstones, gallbladder wall thickening, or biliary dilatation. Pancreas: Unremarkable. No pancreatic ductal dilatation or surrounding inflammatory changes. Spleen: Normal in size without focal abnormality. Adrenals/Urinary Tract: A 1.2 cm x 0.9 cm low-attenuation right adrenal mass is seen. The left adrenal gland is normal in appearance. Kidneys are normal in size, without obstructing renal calculi or hydronephrosis. A 2 mm nonobstructing renal stone is seen within the mid left kidney. A 5.7  cm x 5.6 cm x 5.4 cm area of heterogeneous low attenuation is seen within the lower pole of the left kidney. A moderate amount of associated perinephric inflammatory fat stranding is seen. An adjacent 5.5 cm x 4.7 cm x 3.6 cm area of mildly increased attenuation is seen adjacent to the lower pole of the left kidney. The urinary bladder is unremarkable. Stomach/Bowel: Stomach is within normal limits. Appendix appears normal. Surgically anastomosed bowel is seen within the anterior aspect of the mid abdomen. No evidence of bowel wall thickening, distention, or inflammatory changes. Vascular/Lymphatic: Aortic atherosclerosis. No enlarged abdominal or pelvic lymph nodes. Reproductive: The prostate gland is moderately enlarged. Other: No abdominal wall hernia or abnormality. No abdominopelvic ascites. Musculoskeletal: There is approximately 3 mm retrolisthesis of the L5 vertebral body on S1. IMPRESSION: 1. Findings consistent with a large mass within the lower pole of the left kidney, with an adjacent subacute hematoma. While no evidence of active bleeding is identified, MRI correlation is recommended to further confirm the presence of an underlying neoplasm. 2. 2 mm nonobstructing renal stone within the left kidney. 3. Low-attenuation right adrenal mass likely consistent with an adrenal adenoma. 4. Very small bilateral pleural  effusions. 5. Findings which may represent a hepatic hemangioma versus solitary metastatic lesion. MRI correlation is again recommended. Aortic Atherosclerosis (ICD10-I70.0). Electronically Signed   By: Virgina Norfolk M.D.   On: 11/19/2020 16:13   DG CHEST PORT 1 VIEW  Result Date: 11/18/2020 CLINICAL DATA:  Fever. EXAM: PORTABLE CHEST 1 VIEW COMPARISON:  May 04, 2016 FINDINGS: The heart size and mediastinal contours are within normal limits. There is moderate severity calcification of the aortic arch. Both lungs are clear. The visualized skeletal structures are unremarkable. IMPRESSION: No active disease. Electronically Signed   By: Virgina Norfolk M.D.   On: 11/18/2020 17:14    Scheduled Meds: . atorvastatin  20 mg Oral Daily  . docusate sodium  100 mg Oral BID  . feeding supplement  237 mL Oral BID BM  . folic acid  1 mg Oral Daily  . insulin aspart  0-9 Units Subcutaneous TID WC  . multivitamin with minerals  1 tablet Oral Daily  . nicotine  21 mg Transdermal Daily  . pantoprazole  40 mg Oral Q1200  . sodium chloride flush  3 mL Intravenous Q12H  . thiamine  100 mg Oral Daily   Or  . thiamine  100 mg Intravenous Daily   Continuous Infusions:    LOS: 4 days   Domenic Polite, MD [facility] 11/20/2020, 1:32 PM

## 2020-11-20 NOTE — Progress Notes (Signed)
PT Cancellation Note  Patient Details Name: Chad Cantu MRN: [id number] DOB: 10-29-1951   Cancelled Treatment:    Reason Eval/Treat Not Completed: Patient declined, no reason specified (pt declined any attempt at mobility stating not feeling well, SOB, dizzy when getting up earlier and reports feeling concerned over CT results. Educated for importance of mobility but still refused.)   Sarya Linenberger B Vista Sawatzky 11/20/2020, 10:45 AM Bayard Males, PT Acute Rehabilitation Services Pager: [phone] Office: [phone]

## 2020-11-21 ENCOUNTER — Other Ambulatory Visit: Payer: Self-pay | Admitting: Oncology

## 2020-11-21 ENCOUNTER — Telehealth: Payer: Self-pay | Admitting: Oncology

## 2020-11-21 ENCOUNTER — Inpatient Hospital Stay (HOSPITAL_COMMUNITY): Payer: Medicare Other

## 2020-11-21 ENCOUNTER — Ambulatory Visit (HOSPITAL_COMMUNITY): Payer: Medicare Other | Admitting: Licensed Clinical Social Worker

## 2020-11-21 DIAGNOSIS — N2889 Other specified disorders of kidney and ureter: Secondary | ICD-10-CM

## 2020-11-21 DIAGNOSIS — R160 Hepatomegaly, not elsewhere classified: Secondary | ICD-10-CM

## 2020-11-21 LAB — CBC
HCT: 30.9 % — ABNORMAL LOW (ref 39.0–52.0)
Hemoglobin: 10.4 g/dL — ABNORMAL LOW (ref 13.0–17.0)
MCH: 27.9 pg (ref 26.0–34.0)
MCHC: 33.7 g/dL (ref 30.0–36.0)
MCV: 82.8 fL (ref 80.0–100.0)
Platelets: 413 10*3/uL — ABNORMAL HIGH (ref 150–400)
RBC: 3.73 MIL/uL — ABNORMAL LOW (ref 4.22–5.81)
RDW: 15.3 % (ref 11.5–15.5)
WBC: 10 10*3/uL (ref 4.0–10.5)
nRBC: 0 % (ref 0.0–0.2)

## 2020-11-21 LAB — BASIC METABOLIC PANEL
Anion gap: 10 (ref 5–15)
BUN: 11 mg/dL (ref 8–23)
CO2: 25 mmol/L (ref 22–32)
Calcium: 10.6 mg/dL — ABNORMAL HIGH (ref 8.9–10.3)
Chloride: 102 mmol/L (ref 98–111)
Creatinine, Ser: 0.67 mg/dL (ref 0.61–1.24)
GFR, Estimated: >60 mL/min (ref >60)
Glucose, Bld: 114 mg/dL — ABNORMAL HIGH (ref 70–99)
Potassium: 3.8 mmol/L (ref 3.5–5.1)
Sodium: 137 mmol/L (ref 135–145)

## 2020-11-21 LAB — GLUCOSE, CAPILLARY
Glucose-Capillary: 105 mg/dL — ABNORMAL HIGH (ref 70–99)
Glucose-Capillary: 94 mg/dL (ref 70–99)
Glucose-Capillary: 90 mg/dL (ref 70–99)
Glucose-Capillary: 100 mg/dL — ABNORMAL HIGH (ref 70–99)

## 2020-11-21 MED ORDER — DIAZEPAM 2 MG PO TABS
2.0000 mg | ORAL_TABLET | Freq: Two times a day (BID) | ORAL | Status: AC
  Administered 2020-11-21 (×2): 2 mg via ORAL
  Filled 2020-11-21 (×2): qty 1

## 2020-11-21 MED ORDER — GADOBUTROL 1 MMOL/ML IV SOLN
7.5000 mL | Freq: Once | INTRAVENOUS | Status: AC | PRN
  Administered 2020-11-21: 7.5 mL via INTRAVENOUS

## 2020-11-21 NOTE — Progress Notes (Signed)
Brief oncology note:  Consult received.  Reviewed images with on-call MD who spoke with Dr. Alen Blew.  Dr. Alen Blew will see this patient as an outpatient at the cancer center.  He would recommend proceeding with an ultrasound-guided liver biopsy for tissue diagnosis.  I have discussed this with the patient and his wife and also with the hospitalist.  I have placed an order for an ultrasound-guided liver biopsy by IR.  This information will be needed to discuss treatment options.  If the patient is discharged over the weekend prior to the ultrasound-guided liver biopsy being performed, this can be performed as an outpatient early next week.  The patient is scheduled to see Dr. Alen Blew as a new patient at the cancer center on Thursday, 11/27/2020 at [place], DNP, AGPCNP-BC, AOCNP

## 2020-11-21 NOTE — Progress Notes (Signed)
IR consulted by Mikey Bussing, NP for possible image-guided liver lesion biopsy.  Case/images have been reviewed by Dr. Annamaria Boots who approves procedure. Per IR protocol, earliest procedure can occur on IP basis is Monday 11/24/2020 (biopsies do not occur on weekends in IR). Chart check revealed possible discharge plans for this weekend. Discussed case with Dr. Broadus John who states no other reason for patient to remain in house other than biopsy, per oncology note ok with procedure on OP basis- plan for biopsy on OP basis. Dr. Broadus John to place OP liver biopsy order on discharge. Schedulers to call patient following discharge to schedule procedure.  Please call IR with questions/concerns.   Bea Graff Ikea Demicco, PA-C 11/21/2020, 3:52 PM

## 2020-11-21 NOTE — Telephone Encounter (Signed)
Chad Cantu has been scheduled to see Dr. Alen Blew on 12/16 at 11am for renal mass. A msg has been sent to Mikey Bussing, NP to notify the pt.

## 2020-11-21 NOTE — Progress Notes (Signed)
Physical Therapy Treatment Patient Details Name: Chad Cantu MRN: [id number] DOB: 27-Feb-1951 Today's Date: 11/21/2020    History of Present Illness 69 y/o M presenting to ED on 12/5 for LLQ pain (on and off 1-2 months), dizziness, nausea, diarhea, and tremors. PMHx: DM, polysubstance abuse, HTN, and HLD.    PT Comments    Patient agrees to PT, ambulated 350 feet, no AD. Ambulated up/down flight of stairs with min guard, single rail. Patient is doing well with mobility. Continue to see for maintaining strength and mobility for return home.    Follow Up Recommendations  No PT follow up     Equipment Recommendations  None recommended by PT    Recommendations for Other Services       Precautions / Restrictions Precautions Precaution Comments: mod fall Restrictions Weight Bearing Restrictions: No    Mobility  Bed Mobility Overal bed mobility: Independent Bed Mobility: Supine to Sit;Sit to Supine     Supine to sit: Independent Sit to supine: Independent      Transfers Overall transfer level: Modified independent Equipment used: None Transfers: Sit to/from Stand              Ambulation/Gait Ambulation/Gait assistance: Min Gaffer (Feet): 350 Feet Assistive device: None Gait Pattern/deviations: Step-through pattern Gait velocity: WNL   General Gait Details: good balance, normal gait.   Stairs Stairs: Yes Stairs assistance: Min guard;Supervision Stair Management: One rail Right;Alternating pattern Number of Stairs: 12 General stair comments: patient safe with steps.   Wheelchair Mobility    Modified Rankin (Stroke Patients Only)       Balance Overall balance assessment: Modified Independent Sitting-balance support: Feet supported Sitting balance-Leahy Scale: Normal     Standing balance support: No upper extremity supported;During functional activity Standing balance-Leahy Scale: Good Standing balance comment: no lob  or significant difficulty noted                            Cognition Arousal/Alertness: Awake/alert Behavior During Therapy: WFL for tasks assessed/performed Overall Cognitive Status: Within Functional Limits for tasks assessed                                        Exercises      General Comments        Pertinent Vitals/Pain Pain Assessment: No/denies pain    Home Living                      Prior Function            PT Goals (current goals can now be found in the care plan section) Acute Rehab PT Goals Patient Stated Goal: get back to work PT Goal Formulation: With patient Time For Goal Achievement: 12/02/20 Potential to Achieve Goals: Good Progress towards PT goals: Progressing toward goals    Frequency    Min 3X/week      PT Plan Current plan remains appropriate    Co-evaluation              AM-PAC PT "6 Clicks" Mobility   Outcome Measure  Help needed turning from your back to your side while in a flat bed without using bedrails?: None Help needed moving from lying on your back to sitting on the side of a flat bed without using bedrails?: None Help needed moving to and from  a bed to a chair (including a wheelchair)?: None Help needed standing up from a chair using your arms (e.g., wheelchair or bedside chair)?: None Help needed to walk in hospital room?: None Help needed climbing 3-5 steps with a railing? : A Little 6 Click Score: 23    End of Session   Activity Tolerance: Patient tolerated treatment well Patient left: in bed;with call bell/phone within reach Nurse Communication: Mobility status PT Visit Diagnosis: Muscle weakness (generalized) (M62.81)     Time: 7949-9718 PT Time Calculation (min) (ACUTE ONLY): 14 min  Charges:  $Gait Training: 8-22 mins                     Merl Guardino, PT, GCS 11/21/20,10:04 AM

## 2020-11-21 NOTE — Progress Notes (Signed)
PROGRESS NOTE    Chad Cantu  QAS:[id number] DOB: 22-Mar-1951 DOA: 11/16/2020 PCP: [name], MD    Brief Narrative:  69 y.o. male with medical history significant of polysubstance abuse; HTN; HLD; and DM presenting with abdominal pain. He reported starting LLQ pain and dizziness with standing and shakes. 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 Perinephric subacute hematoma -Hemoglobin is stable, no symptoms suggestive of acute bleed at this time, avoid anticoagulants and antiplatelet agents -Urology consult appreciated, given metastatic lesions likely no plan for surgical management -Oncology consult requested  ETOH dependence (severe) with withdrawal -chronic ETOH dependence, reported drinking alcohol all day for the last 55 years, 9-10 drinks per day -Was started on Valium and Ativan per [facility] protocol on admission -Improving clinically, Valium dose decreased -Counseled, TOC consult appreciated, outpatient follow-up made for [facility]  Severe hypomagnesemia -Replaced  Dehydration -Clinically improved with hydration  HTN -Stopped taking Norvasc -BP seems stable at this time  DM -Previous A1c indicates good control -hold Glucophage -Continue SSI coverage as needed  HLD -Continue Lipitor  Tobacco dependence -Counseled, continue nicotine patch  Low-grade fever 12/7 -Covid PCR and blood cultures negative, chest x-ray and urinalysis unremarkable, monitor clinically  DVT prophylaxis: SCDs, Lovenox discontinued due to large  perinephric hematoma Code Status: Full Family Communication: Wife 12/10  Status is: Inpatient  Remains inpatient appropriate because:IV treatments appropriate due to intensity of illness or inability to take PO and Inpatient level of care appropriate due to severity of illness   Dispo: The patient is from: Home              Anticipated d/c is to: Home              Anticipated d/c date is: Possibly tomorrow if no further work-up planned as inpatient              Patient currently is not medically stable to d/c.    Consultants:     Procedures:     Antimicrobials: Anti-infectives (From admission, onward)   None      Subjective: -Feels okay overall, did have mild shakes earlier today  Objective: Vitals:   11/20/20 0500 11/20/20 1352 11/20/20 1613 11/20/20 2210  BP: (!) 145/70 130/73 113/73 119/80  Pulse: 77 (!) 122 96 88  Resp: 20 20 18 20   Temp: 98 F (36.7 C) (!) 100.4 F (38 C) 100.3 F (37.9 C) 99.5 F (37.5 C)  TempSrc: Oral Axillary Oral Oral  SpO2:  98% 97% 99%  Weight:      Height:        Intake/Output Summary (Last 24 hours) at 11/21/2020 1413 Last data filed at 11/20/2020 2121 Gross per 24 hour  Intake --  Output 325 ml  Net -325 ml   Filed Weights   11/16/20 0925  Weight: 72.6 kg    Examination: General exam: Pleasant average built male sitting up in bed, AAOx3, no distress HEENT: No JVD CVS: S1-S2, regular rate rhythm Lungs: Decreased breath sounds bases Abdomen: Soft, mildly distended, nontender, bowel sounds present Remedies: No edema  Skin: No rashes on exposed skin   Data Reviewed: I have personally reviewed following labs and imaging studies  CBC: Recent Labs  Lab 11/17/20 0210 11/19/20 0340 11/19/20 1643 11/20/20 0245 11/21/20 0448  WBC 11.1* 10.3 11.2* 9.8 10.0  HGB 10.0* 10.3* 10.2* 10.4* 10.4*  HCT 30.0* 32.8* 32.1* 33.0* 30.9*  MCV 82.9 85.0 84.0 84.0 82.8  PLT 399 370 388 362 539*   Basic Metabolic Panel: Recent  Labs  Lab 11/16/20 1017 11/17/20 0210 11/18/20 0117 11/19/20 0340 11/20/20 0245 11/21/20 0448  NA  --  134* 136 137 134* 137  K  --  3.4* 4.5 4.2 4.0 3.8  CL  --  95* 98 100 97* 102  CO2  --  29 28 27 26 25   GLUCOSE  --  110* 95 103* 122* 114*  BUN  --  11 7* 10 8 11   CREATININE  --  0.76 0.77 0.79 0.80 0.67  CALCIUM  --  10.0 10.9* 10.8* 10.6* 10.6*  MG 1.9  --  1.3* 1.3*  --   --    GFR: Estimated Creatinine Clearance: 80.9 mL/min (by C-G formula based on SCr of 0.67 mg/dL). Liver Function Tests: Recent Labs  Lab 11/16/20 0936 11/18/20 0117 11/19/20 0340 11/20/20 0245  AST 30 39 26 25  ALT 28 48* 39 36  ALKPHOS 99 80 71 72  BILITOT 0.7 0.5 0.5 0.5  PROT 8.5* 7.2 7.1 7.3  ALBUMIN 2.1* 1.9* 1.8* 1.7*   Recent Labs  Lab 11/16/20 0936  LIPASE 35   No results for input(s): AMMONIA in the last 168 hours. Coagulation Profile: Recent Labs  Lab 11/20/20 0245  INR 1.2   Cardiac Enzymes: No results for input(s): CKTOTAL, CKMB, CKMBINDEX, TROPONINI in the last 168 hours. BNP (last 3 results) No results for input(s): PROBNP in the last 8760 hours. HbA1C: No results for input(s): HGBA1C in the last 72 hours. CBG: Recent Labs  Lab 11/20/20 1210 11/20/20 1630 11/20/20 2040 11/21/20 0759 11/21/20 1221  GLUCAP 82 168* 94 105* 94   Lipid Profile: No results for input(s): CHOL, HDL, LDLCALC, TRIG, CHOLHDL, LDLDIRECT in the last 72 hours. Thyroid Function Tests: No results for input(s): TSH, T4TOTAL, FREET4, T3FREE, THYROIDAB in the last 72 hours. Anemia Panel: No results for input(s): VITAMINB12, FOLATE, FERRITIN, TIBC, IRON, RETICCTPCT in the last 72 hours. Sepsis Labs: No results for input(s): PROCALCITON, LATICACIDVEN in the last 168 hours.  Recent Results (from the past 240 hour(s))  Resp Panel by RT-PCR (Flu A&B, Covid) Nasopharyngeal Swab     Status: None   Collection Time: 11/16/20 12:56 PM   Specimen: Nasopharyngeal Swab; Nasopharyngeal(NP) swabs in  vial transport medium  Result Value Ref Range Status   SARS Coronavirus 2 by RT PCR NEGATIVE NEGATIVE Final    Comment: (NOTE) SARS-CoV-2 target nucleic acids are NOT DETECTED.  The SARS-CoV-2 RNA is generally detectable in upper respiratory specimens during the acute phase of infection. The lowest concentration of SARS-CoV-2 viral copies this assay can detect is 138 copies/mL. A negative result does not preclude SARS-Cov-2 infection and should not be used as the sole basis for treatment or other patient management decisions. A negative result may occur with  improper specimen collection/handling, submission of specimen other than nasopharyngeal swab, presence of viral mutation(s) within the areas targeted by this assay, and inadequate number of viral copies(<138 copies/mL). A negative result must be combined with clinical observations, patient history, and epidemiological information. The expected result is Negative.  Fact Sheet for Patients:  EntrepreneurPulse.com.au  Fact Sheet for Healthcare Providers:  IncredibleEmployment.be  This test is no t yet approved or cleared by the [place] FDA and  has been authorized for detection and/or diagnosis of SARS-CoV-2 by FDA under an Emergency Use Authorization (EUA). This EUA will remain  in effect (meaning this test can be used) for the duration of the COVID-19 declaration under Section 564(b)(1) of the Act, 21 U.S.C.section 360bbb-3(b)(1), unless the authorization is terminated  or revoked sooner.       Influenza A by PCR NEGATIVE NEGATIVE Final   Influenza B by PCR NEGATIVE NEGATIVE Final    Comment: (NOTE) The Xpert Xpress SARS-CoV-2/FLU/RSV plus assay is intended as an aid in the diagnosis of influenza from Nasopharyngeal swab specimens and should not be used as a sole basis for treatment. Nasal washings and aspirates are unacceptable for Xpert Xpress SARS-CoV-2/FLU/RSV testing.  Fact  Sheet for Patients: EntrepreneurPulse.com.au  Fact Sheet for Healthcare Providers: IncredibleEmployment.be  This test is not yet approved or cleared by the [place] FDA and has been authorized for detection and/or diagnosis of SARS-CoV-2 by FDA under an Emergency Use Authorization (EUA). This EUA will remain in effect (meaning this test can be used) for the duration of the COVID-19 declaration under Section 564(b)(1) of the Act, 21 U.S.C. section 360bbb-3(b)(1), unless the authorization is terminated or revoked.  Performed at [facility], [place] [address] [place], [place] [address]   Culture, blood (routine x 2)     Status: None (Preliminary result)   Collection Time: 11/18/20  4:12 PM   Specimen: BLOOD LEFT HAND  Result Value Ref Range Status   Specimen Description BLOOD LEFT HAND  Final   Special Requests   Final    BOTTLES DRAWN AEROBIC AND ANAEROBIC Blood Culture adequate volume   Culture   Final    NO GROWTH 3 DAYS Performed at [facility], [place] [address], [place], [place] [address]    Report Status PENDING  Incomplete  Culture, blood (routine x 2)     Status: None (Preliminary result)   Collection Time: 11/18/20  4:17 PM   Specimen: BLOOD  Result Value Ref Range Status   Specimen Description BLOOD RIGHT ANTECUBITAL  Final   Special Requests   Final    BOTTLES DRAWN AEROBIC AND ANAEROBIC Blood Culture results may not be optimal due to an inadequate volume of blood received in culture bottles   Culture   Final    NO GROWTH 3 DAYS Performed at [facility], [place] [address] [place], [place] [address]    Report Status PENDING  Incomplete     Radiology Studies: MR ABDOMEN W WO CONTRAST  Result Date: 11/21/2020 CLINICAL DATA:  Evaluate left renal lesions seen CT scan. EXAM: MRI ABDOMEN WITHOUT AND WITH CONTRAST TECHNIQUE: Multiplanar multisequence MR imaging of the abdomen was performed both before and after the  administration of intravenous contrast. CONTRAST:  7.46mL GADAVIST GADOBUTROL 1 MMOL/ML IV SOLN COMPARISON:  CT scan 11/19/2020 FINDINGS: Lower chest: The lung bases are grossly clear. No pulmonary lesions or pleural or pericardial effusion. Hepatobiliary: Hepatic metastatic lesions are identified. 2.1 cm segment 7 lesion on image 29/15. 16 mm segment 6 lesion on image 47/15. 13 mm segment 6 lesion image 59/17. Other smaller scattered lesions in both lobes. The gallbladder is unremarkable. No intra or extrahepatic biliary dilatation. Pancreas:  No mass, inflammation or ductal dilatation. Spleen:  Normal size. No focal lesions.  Adrenals/Urinary Tract: Small right adrenal gland nodule has MR imaging features of a benign adenoma. As demonstrated on the CT scan there is a large heterogeneously enhancing mass associated with the lower pole region of left kidney. This measures 5.8 x 5.5 x 4.9 cm. Associated perinephric hematoma as demonstrated on the CT scan measuring approximately 6 x 5 cm. The left renal vein is patent. No retroperitoneal lymphadenopathy is identified. Stomach/Bowel: The stomach, duodenum, visualized small bowel and visualize colon are grossly normal. Vascular/Lymphatic: The aorta and branch vessels are normal. The major venous structures are patent. No mesenteric or retroperitoneal lymphadenopathy. Other:  No ascites or abdominal wall hernia. Musculoskeletal: No significant bony findings. No findings suspicious for osseous metastatic disease. IMPRESSION: 1. 5.8 x 5.5 x 4.9 cm heterogeneously enhancing mass associated with the lower pole region of the left kidney. This is consistent with renal cell neoplasm. No evidence of renal vein involvement or retroperitoneal lymphadenopathy. 2. Hepatic metastatic lesions. 3. Small right adrenal gland nodule consistent with a benign adenoma. Electronically Signed   By: Marijo Sanes M.D.   On: 11/21/2020 06:02    Scheduled Meds: . atorvastatin  20 mg Oral Daily   . diazepam  2 mg Oral BID  . docusate sodium  100 mg Oral BID  . feeding supplement  237 mL Oral BID BM  . folic acid  1 mg Oral Daily  . insulin aspart  0-9 Units Subcutaneous TID WC  . multivitamin with minerals  1 tablet Oral Daily  . nicotine  21 mg Transdermal Daily  . pantoprazole  40 mg Oral Q1200  . sodium chloride flush  3 mL Intravenous Q12H  . thiamine  100 mg Oral Daily   Or  . thiamine  100 mg Intravenous Daily   Continuous Infusions:    LOS: 5 days   Domenic Polite, MD [facility] 11/21/2020, 2:13 PM

## 2020-11-22 LAB — GLUCOSE, CAPILLARY: Glucose-Capillary: 90 mg/dL (ref 70–99)

## 2020-11-22 MED ORDER — ACETAMINOPHEN 325 MG PO TABS: 650.0000 mg | ORAL_TABLET | Freq: Four times a day (QID) | ORAL | Status: AC | PRN

## 2020-11-22 NOTE — Discharge Summary (Signed)
Physician Discharge Summary  Chad Cantu FBP:[id number] DOB: 1951/09/23 DOA: 11/16/2020  PCP: [name], MD  Admit date: 11/16/2020 Discharge date: 11/22/2020  Time spent: 35 minutes  Recommendations for Outpatient Follow-up:  1. Referral made for Liver Biopsy in Radiology 2. Oncology Dr.Shadad next week at the [facility] 3. 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 Perinephric subacute hematoma -CT abd done due to abd discomfort and weight loss was concerning for above findings -Hemoglobin is stable, no symptoms suggestive of acute bleed at this time, avoid anticoagulants and antiplatelet agents -Urology consulted, seen by Dr.Matt Abner Greenspan, MRI ordered which was concerning for [place] with liver metastasis, Oncology consulted, Dr.Shadad recommended liver biopsy, due to the weekend long delay in getting this inpatient and hence outpatient referral completed for biopsy in IR next week followed by FU with Dr.Shahdad at the [facility]  ETOH dependence (severe) with withdrawal -chronic ETOH dependence, reported drinking alcohol  all day for the last 55 years, 9-10 drinks per day -Was started on Valium and Ativan per [facility] protocol on admission -Improving clinically, Valium dose decreased -Counseled, TOC consult appreciated, outpatient follow-up made for [facility] -stable now  Severe hypomagnesemia -Replaced  Dehydration -Clinically improved with hydration  HTN -Stopped taking Norvasc -BP seems stable at this time  DM -Previous A1c indicates good control -hold Glucophage -Continue SSI coverage as needed  HLD -Continue Lipitor  Tobacco dependence -Counseled, continue nicotine patch  Intermittent Low-grade fever  -likely from Renal mass/malignancy, Covid PCR and blood cultures negative, chest x-ray and urinalysis unremarkable, supportive care    Consultations:  Urology Dr.Gay  Oncology Dr.Shahdad  Discharge Exam: Vitals:   11/21/20 2031 11/22/20 0424  BP: 111/73 108/69  Pulse: 94 89  Resp: 16 14  Temp: 99.9 F (37.7 C) 99.1 F (37.3 C)  SpO2: 97% 97%    General: AAOx3 Cardiovascular: S1S2/RRR Respiratory: CTAB  Discharge Instructions   Discharge Instructions    Diet - low sodium heart healthy   Complete by: As directed    Diet Carb Modified   Complete by: As directed    Increase activity slowly   Complete by: As directed      Allergies as of 11/22/2020   No Known Allergies     Medication List    TAKE these medications   acetaminophen 325 MG tablet Commonly known as: TYLENOL Take 2 tablets (650 mg total) by mouth every 6 (six) hours as needed for mild pain (or Fever >/= 101).   atorvastatin 20 MG tablet Commonly known as: LIPITOR Take 1 tablet (20 mg total) by mouth daily.   metFORMIN 500 MG tablet Commonly  known as: GLUCOPHAGE Take 1 tablet (500 mg total) by mouth daily with breakfast.   VITAMIN C PO Take by mouth.      No Known Allergies  Follow-up Information    Schedule an appointment as soon as possible for a visit with  [place].   Contact information: [place]       [name], MD Follow up on 11/27/2020.   Specialty: Oncology Contact information: [place] [address] [phone]        [facility] Follow up.   Why: You will get a Call from Radiology dept at [facility] for Biopsy               The results of significant diagnostics from this hospitalization (including imaging, microbiology, ancillary and laboratory) are listed below for reference.    Significant Diagnostic Studies: MR ABDOMEN W WO CONTRAST  Result Date: 11/21/2020 CLINICAL DATA:  Evaluate left renal lesions seen CT scan. EXAM: MRI ABDOMEN WITHOUT AND WITH CONTRAST TECHNIQUE: Multiplanar multisequence MR imaging of the abdomen was performed both before and after the administration of intravenous contrast. CONTRAST:  7.25mL GADAVIST GADOBUTROL 1 MMOL/ML IV SOLN COMPARISON:  CT scan 11/19/2020 FINDINGS: Lower chest: The lung bases are grossly clear. No pulmonary lesions or pleural or pericardial effusion. Hepatobiliary: Hepatic metastatic lesions are identified. 2.1 cm segment 7 lesion on image 29/15. 16 mm segment 6 lesion on image 47/15. 13 mm segment 6 lesion image 59/17. Other smaller scattered lesions in both lobes. The gallbladder is unremarkable. No intra or extrahepatic biliary dilatation. Pancreas:  No mass, inflammation or ductal dilatation. Spleen:  Normal size. No focal lesions. Adrenals/Urinary Tract: Small right adrenal gland nodule has MR imaging features of a benign adenoma. As demonstrated on the CT scan there is a large heterogeneously enhancing mass associated with the lower pole region of left kidney. This measures 5.8 x 5.5 x 4.9 cm. Associated perinephric hematoma as demonstrated on the CT scan measuring approximately 6 x 5 cm. The left renal vein is patent. No retroperitoneal lymphadenopathy is identified.  Stomach/Bowel: The stomach, duodenum, visualized small bowel and visualize colon are grossly normal. Vascular/Lymphatic: The aorta and branch vessels are normal. The major venous structures are patent. No mesenteric or retroperitoneal lymphadenopathy. Other:  No ascites or abdominal wall hernia. Musculoskeletal: No significant bony findings. No findings suspicious for osseous metastatic disease. IMPRESSION: 1. 5.8 x 5.5 x 4.9 cm heterogeneously enhancing mass associated with the lower pole region of the left kidney. This is consistent with renal cell neoplasm. No evidence of renal vein involvement or retroperitoneal lymphadenopathy. 2. Hepatic metastatic lesions. 3. Small right adrenal gland nodule consistent with a benign adenoma. Electronically Signed   By: Marijo Sanes M.D.   On: 11/21/2020 06:02   CT ABDOMEN PELVIS W CONTRAST  Result Date: 11/19/2020 CLINICAL DATA:  Chronic abdominal pain and weight loss. EXAM: CT ABDOMEN AND PELVIS WITH CONTRAST TECHNIQUE: Multidetector CT imaging of the abdomen and pelvis was performed using the standard protocol following bolus administration of intravenous contrast. CONTRAST:  138mL OMNIPAQUE IOHEXOL 300 MG/ML  SOLN COMPARISON:  None. FINDINGS: Lower chest: A trace amount of atelectasis is seen within the posterior aspect of the bilateral lung bases. Very small bilateral pleural effusions are noted. Hepatobiliary: A 1.3 cm x 0.9 cm focus of ill-defined low attenuation, with faint surrounding hyperdense rim, is seen within the posterior aspect of the right lobe of  the liver. No gallstones, gallbladder wall thickening, or biliary dilatation. Pancreas: Unremarkable. No pancreatic ductal dilatation or surrounding inflammatory changes. Spleen: Normal in size without focal abnormality. Adrenals/Urinary Tract: A 1.2 cm x 0.9 cm low-attenuation right adrenal mass is seen. The left adrenal gland is normal in appearance. Kidneys are normal in size, without obstructing renal  calculi or hydronephrosis. A 2 mm nonobstructing renal stone is seen within the mid left kidney. A 5.7 cm x 5.6 cm x 5.4 cm area of heterogeneous low attenuation is seen within the lower pole of the left kidney. A moderate amount of associated perinephric inflammatory fat stranding is seen. An adjacent 5.5 cm x 4.7 cm x 3.6 cm area of mildly increased attenuation is seen adjacent to the lower pole of the left kidney. The urinary bladder is unremarkable. Stomach/Bowel: Stomach is within normal limits. Appendix appears normal. Surgically anastomosed bowel is seen within the anterior aspect of the mid abdomen. No evidence of bowel wall thickening, distention, or inflammatory changes. Vascular/Lymphatic: Aortic atherosclerosis. No enlarged abdominal or pelvic lymph nodes. Reproductive: The prostate gland is moderately enlarged. Other: No abdominal wall hernia or abnormality. No abdominopelvic ascites. Musculoskeletal: There is approximately 3 mm retrolisthesis of the L5 vertebral body on S1. IMPRESSION: 1. Findings consistent with a large mass within the lower pole of the left kidney, with an adjacent subacute hematoma. While no evidence of active bleeding is identified, MRI correlation is recommended to further confirm the presence of an underlying neoplasm. 2. 2 mm nonobstructing renal stone within the left kidney. 3. Low-attenuation right adrenal mass likely consistent with an adrenal adenoma. 4. Very small bilateral pleural effusions. 5. Findings which may represent a hepatic hemangioma versus solitary metastatic lesion. MRI correlation is again recommended. Aortic Atherosclerosis (ICD10-I70.0). Electronically Signed   By: Virgina Norfolk M.D.   On: 11/19/2020 16:13   DG CHEST PORT 1 VIEW  Result Date: 11/18/2020 CLINICAL DATA:  Fever. EXAM: PORTABLE CHEST 1 VIEW COMPARISON:  May 04, 2016 FINDINGS: The heart size and mediastinal contours are within normal limits. There is moderate severity calcification of the  aortic arch. Both lungs are clear. The visualized skeletal structures are unremarkable. IMPRESSION: No active disease. Electronically Signed   By: Virgina Norfolk M.D.   On: 11/18/2020 17:14    Microbiology: Recent Results (from the past 240 hour(s))  Resp Panel by RT-PCR (Flu A&B, Covid) Nasopharyngeal Swab     Status: None   Collection Time: 11/16/20 12:56 PM   Specimen: Nasopharyngeal Swab; Nasopharyngeal(NP) swabs in vial transport medium  Result Value Ref Range Status   SARS Coronavirus 2 by RT PCR NEGATIVE NEGATIVE Final    Comment: (NOTE) SARS-CoV-2 target nucleic acids are NOT DETECTED.  The SARS-CoV-2 RNA is generally detectable in upper respiratory specimens during the acute phase of infection. The lowest concentration of SARS-CoV-2 viral copies this assay can detect is 138 copies/mL. A negative result does not preclude SARS-Cov-2 infection and should not be used as the sole basis for treatment or other patient management decisions. A negative result may occur with  improper specimen collection/handling, submission of specimen other than nasopharyngeal swab, presence of viral mutation(s) within the areas targeted by this assay, and inadequate number of viral copies(<138 copies/mL). A negative result must be combined with clinical observations, patient history, and epidemiological information. The expected result is Negative.  Fact Sheet for Patients:  EntrepreneurPulse.com.au  Fact Sheet for Healthcare Providers:  IncredibleEmployment.be  This test is no t yet approved or cleared by the  [place] Architectural technologist and  has been authorized for detection and/or diagnosis of SARS-CoV-2 by FDA under an Print production planner (EUA). This EUA will remain  in effect (meaning this test can be used) for the duration of the COVID-19 declaration under Section 564(b)(1) of the Act, 21 U.S.C.section 360bbb-3(b)(1), unless the authorization is  terminated  or revoked sooner.       Influenza A by PCR NEGATIVE NEGATIVE Final   Influenza B by PCR NEGATIVE NEGATIVE Final    Comment: (NOTE) The Xpert Xpress SARS-CoV-2/FLU/RSV plus assay is intended as an aid in the diagnosis of influenza from Nasopharyngeal swab specimens and should not be used as a sole basis for treatment. Nasal washings and aspirates are unacceptable for Xpert Xpress SARS-CoV-2/FLU/RSV testing.  Fact Sheet for Patients: EntrepreneurPulse.com.au  Fact Sheet for Healthcare Providers: IncredibleEmployment.be  This test is not yet approved or cleared by the [place] FDA and has been authorized for detection and/or diagnosis of SARS-CoV-2 by FDA under an Emergency Use Authorization (EUA). This EUA will remain in effect (meaning this test can be used) for the duration of the COVID-19 declaration under Section 564(b)(1) of the Act, 21 U.S.C. section 360bbb-3(b)(1), unless the authorization is terminated or revoked.  Performed at [facility], [place] [address], [place], [place] [address]   Culture, blood (routine x 2)     Status: None (Preliminary result)   Collection Time: 11/18/20  4:12 PM   Specimen: BLOOD LEFT HAND  Result Value Ref Range Status   Specimen Description BLOOD LEFT HAND  Final   Special Requests   Final    BOTTLES DRAWN AEROBIC AND ANAEROBIC Blood Culture adequate volume   Culture   Final    NO GROWTH 4 DAYS Performed at [facility], [place] [address] [place], [place] [address]    Report Status PENDING  Incomplete  Culture, blood (routine x 2)     Status: None (Preliminary result)   Collection Time: 11/18/20  4:17 PM   Specimen: BLOOD  Result Value Ref Range Status   Specimen Description BLOOD RIGHT ANTECUBITAL  Final   Special Requests   Final    BOTTLES DRAWN AEROBIC AND ANAEROBIC Blood Culture results may not be optimal due to an inadequate volume of blood received in culture  bottles   Culture   Final    NO GROWTH 4 DAYS Performed at [facility], [place] [address], [place],  [address]    Report Status PENDING  Incomplete     Labs: Basic Metabolic Panel: Recent Labs  Lab 11/16/20 1017 11/17/20 0210 11/18/20 0117 11/19/20 0340 11/20/20 0245 11/21/20 0448  NA  --  134* 136 137 134* 137  K  --  3.4* 4.5 4.2 4.0 3.8  CL  --  95* 98 100 97* 102  CO2  --  29 28 27 26 25   GLUCOSE  --  110* 95 103* 122* 114*  BUN  --  11 7* 10 8 11   CREATININE  --  0.76 0.77 0.79 0.80 0.67  CALCIUM  --  10.0 10.9* 10.8* 10.6* 10.6*  MG 1.9  --  1.3* 1.3*  --   --    Liver Function Tests: Recent Labs  Lab 11/16/20 0936 11/18/20 0117 11/19/20 0340 11/20/20 0245  AST 30 39 26 25  ALT 28 48* 39 36  ALKPHOS 99 80 71 72  BILITOT 0.7 0.5 0.5 0.5  PROT 8.5* 7.2 7.1 7.3  ALBUMIN 2.1* 1.9*  1.8* 1.7*   Recent Labs  Lab 11/16/20 0936  LIPASE 35   No results for input(s): AMMONIA in the last 168 hours. CBC: Recent Labs  Lab 11/17/20 0210 11/19/20 0340 11/19/20 1643 11/20/20 0245 11/21/20 0448  WBC 11.1* 10.3 11.2* 9.8 10.0  HGB 10.0* 10.3* 10.2* 10.4* 10.4*  HCT 30.0* 32.8* 32.1* 33.0* 30.9*  MCV 82.9 85.0 84.0 84.0 82.8  PLT 399 370 388 362 413*   Cardiac Enzymes: No results for input(s): CKTOTAL, CKMB, CKMBINDEX, TROPONINI in the last 168 hours. BNP: BNP (last 3 results) No results for input(s): BNP in the last 8760 hours.  ProBNP (last 3 results) No results for input(s): PROBNP in the last 8760 hours.  CBG: Recent Labs  Lab 11/21/20 0759 11/21/20 1221 11/21/20 1522 11/21/20 2157 11/22/20 0723  GLUCAP 105* 94 90 100* 90       Signed:  Domenic Polite MD.  [facility] 11/22/2020, 2:37 PM

## 2020-11-23 LAB — CULTURE, BLOOD (ROUTINE X 2)
Culture: NO GROWTH
Culture: NO GROWTH
Special Requests: ADEQUATE

## 2020-11-24 ENCOUNTER — Telehealth: Payer: Self-pay

## 2020-11-24 ENCOUNTER — Ambulatory Visit: Payer: Self-pay | Admitting: *Deleted

## 2020-11-24 NOTE — Telephone Encounter (Signed)
Transition Care Management Follow-up Telephone Call  Date of discharge and from where: 11/22/2020, [facility]   How have you been since you were released from the hospital? He said that he still feels a " little wobbly" and staggers a bit when he gets up.  He denies any alcohol use   Any questions or concerns? No . He is waiting for a call from radiology to schedule liver biopsy.   Items Reviewed:  Did the pt receive and understand the discharge instructions provided? Yes   Medications obtained and verified? Yes , no questions about the medication regime.   Other? No   Any new allergies since your discharge? No   Do you have support at home? Yes   Home Care and Equipment/Supplies: Were home health services ordered? No If so, what is the name of the agency? n/a  Has the agency set up a time to come to the patient's home? n/a Were any new equipment or medical supplies ordered?  No What is the name of the medical supply agency? n/a Were you able to get the supplies/equipment?n/a Do you have any questions related to the use of the equipment or supplies? No, n/a  Functional Questionnaire: (I = Independent and D = Dependent) ADLs: independent  Follow up appointments reviewed:   PCP Hospital f/u appt confirmed? no hospital follow up appointment.. Annual wellness exam with Dr Wynetta Emery 01/06/2021.  He did not want to schedule anything sooner until he has more information from the specialists.   [facility] f/u appt confirmed? Yes  - oncology - 11/26/2020. He is waiting for a call from radiology to schedule liver biopsy.  He is supposed to have  this done prior to appointment 11/26/2020. Instructed him to call oncology if he does not hear from radiology by this afternoon.   Are transportation arrangements needed? No   If their condition worsens, is the pt aware to call PCP or go to the Emergency Dept.?  yes  Was the patient provided with contact information for the  PCP's office or ED? He has the clinic phone number  Was to pt encouraged to call back with questions or concerns? yes

## 2020-11-24 NOTE — Telephone Encounter (Signed)
Message from Erick Blinks sent at 11/24/2020 5:48 PM EST  Summary: Nurse call back    Best contact: [phone]   Pt has BP readings of 101/64. Pt's wife wants to speak to a nurse to make sure the pt is okay. (Not in red word zone, not clinical so couldn't advise)         Patient's wife calling stating that at 1730 the pt's BP was noted to be 104/64, HR not noted during reading. Patient's wife states patient was discharged. Patient's wife states that the patient was experiencing dizziness and lightheadedness before discharge and is still experiencing it now. Patient's wife states that the patient has been unstable while ambulating and is fearful that he will fall. Patient's wife states that the patient has taken BP medication today. Patient's wife unaware of normal BP readings for the patient. Patient's wife states that the patient has not been eating. Patient's wie advised to take patient to the Urgent Care or ED for current symptoms. Understanding verbalized.  Reason for Disposition . [6] Systolic BP 43-838 AND [1] taking blood pressure medications AND [3] dizzy, lightheaded or weak  Answer Assessment - Initial Assessment Questions 1. BLOOD PRESSURE: "What is the blood pressure?" "Did you take at least two measurements 5 minutes apart?"     101/64 about 15 min ago 2. ONSET: "When did you take your blood pressure?"     About 15 min ago 3. HOW: "How did you obtain the blood pressure?" (e.g., visiting nurse, automatic home BP monitor)     Automatic home monitor 4. HISTORY: "Do you have a history of low blood pressure?" "What is your blood pressure normally?"     Patient's wife not aware 5. MEDICATIONS: "Are you taking any medications for blood pressure?" If Yes, ask: "Have they been changed recently?"     yes 6. PULSE RATE: "Do you know what your pulse rate is?"      unknown 7. OTHER SYMPTOMS: "Have you been sick recently?" "Have you had a recent injury?"     Yes recently out of  hospital 8. PREGNANCY: "Is there any chance you are pregnant?" "When was your last menstrual period?"     n/a  Protocols used: BLOOD PRESSURE - LOW-A-AH

## 2020-11-25 ENCOUNTER — Encounter (HOSPITAL_COMMUNITY): Payer: Self-pay

## 2020-11-25 LAB — METANEPHRINES, PLASMA
Metanephrine, Free: 21.8 pg/mL (ref 0.0–88.0)
Normetanephrine, Free: 21.6 pg/mL (ref 0.0–285.2)

## 2020-11-25 NOTE — Progress Notes (Unsigned)
    [place] Male, 69 y.o., [birth year]  MRN:  [id number] Phone:  [phone] Jerilynn Mages)       PCP:  [name], MD Primary Cvg:  Medicare/Medicare Part A And B  Next Appt With Radiology ([facility] 2) 12/01/2020 at 1:00 PM           RE: Biopsy Received: 4 days ago  Message Details  Arne Cleveland, MD  Ernestene Mention   [place] core liver lesion  R/o met RCCa   [facility]    Previous Messages  ----- Message -----  From: Lenore Cordia  Sent: 11/21/2020  4:04 PM EST  To: Ir Procedure Requests  Subject: Biopsy                      Procedure Requested: US Biopsy    Reason for Procedure: renal mass with liver masses.     Provider Requesting: [phone]  Provider Telephone: Maryanna Shape    Other Info:

## 2020-11-26 ENCOUNTER — Telehealth: Payer: Self-pay | Admitting: Oncology

## 2020-11-26 LAB — ALDOSTERONE + RENIN ACTIVITY W/ RATIO
ALDO / PRA Ratio: <1.9 (ref 0.0–30.0)
Aldosterone: <1 ng/dL (ref 0.0–30.0)
PRA LC/MS/MS: 0.515 ng/mL/hr (ref 0.167–5.380)

## 2020-11-26 NOTE — Telephone Encounter (Signed)
Correction: Mikey Bussing, NP has was not notified of the pt's appt. Instead, Dr. Alen Blew was notified. The previous msg was created in error.

## 2020-11-26 NOTE — Telephone Encounter (Signed)
Chad Cantu has been rescheduled to see Dr. Alen Blew on 12/28. Per his patient's wife the appt needed to be after his bx that is scheduled on 12/20.

## 2020-11-27 ENCOUNTER — Other Ambulatory Visit: Payer: Self-pay | Admitting: Radiology

## 2020-11-27 ENCOUNTER — Inpatient Hospital Stay: Payer: Medicare Other | Admitting: Oncology

## 2020-12-01 ENCOUNTER — Other Ambulatory Visit: Payer: Self-pay

## 2020-12-01 ENCOUNTER — Ambulatory Visit (HOSPITAL_COMMUNITY)
Admission: RE | Admit: 2020-12-01 | Discharge: 2020-12-01 | Disposition: A | Discharge status: Home / Self Care | Payer: Medicare Other | Source: Ambulatory Visit | Attending: Oncology | Admitting: Oncology

## 2020-12-01 ENCOUNTER — Encounter (HOSPITAL_COMMUNITY): Payer: Self-pay

## 2020-12-01 DIAGNOSIS — R160 Hepatomegaly, not elsewhere classified: Secondary | ICD-10-CM | POA: Diagnosis present

## 2020-12-01 DIAGNOSIS — C801 Malignant (primary) neoplasm, unspecified: Secondary | ICD-10-CM | POA: Diagnosis not present

## 2020-12-01 DIAGNOSIS — C787 Secondary malignant neoplasm of liver and intrahepatic bile duct: Secondary | ICD-10-CM | POA: Diagnosis not present

## 2020-12-01 LAB — COMPREHENSIVE METABOLIC PANEL
ALT: 43 U/L (ref 0–44)
AST: 26 U/L (ref 15–41)
Albumin: 2.6 g/dL — ABNORMAL LOW (ref 3.5–5.0)
Alkaline Phosphatase: 100 U/L (ref 38–126)
Anion gap: 15 (ref 5–15)
BUN: 31 mg/dL — ABNORMAL HIGH (ref 8–23)
CO2: 23 mmol/L (ref 22–32)
Calcium: 11.4 mg/dL — ABNORMAL HIGH (ref 8.9–10.3)
Chloride: 100 mmol/L (ref 98–111)
Creatinine, Ser: 1.35 mg/dL — ABNORMAL HIGH (ref 0.61–1.24)
GFR, Estimated: 58 mL/min — ABNORMAL LOW (ref >60)
Glucose, Bld: 109 mg/dL — ABNORMAL HIGH (ref 70–99)
Potassium: 4.2 mmol/L (ref 3.5–5.1)
Sodium: 138 mmol/L (ref 135–145)
Total Bilirubin: 0.5 mg/dL (ref 0.3–1.2)
Total Protein: 9.4 g/dL — ABNORMAL HIGH (ref 6.5–8.1)

## 2020-12-01 LAB — CBC WITH DIFFERENTIAL/PLATELET
Abs Immature Granulocytes: 0.05 10*3/uL (ref 0.00–0.07)
Basophils Absolute: 0.1 10*3/uL (ref 0.0–0.1)
Basophils Relative: 1 %
Eosinophils Absolute: 0.1 10*3/uL (ref 0.0–0.5)
Eosinophils Relative: 1 %
HCT: 34.1 % — ABNORMAL LOW (ref 39.0–52.0)
Hemoglobin: 10.6 g/dL — ABNORMAL LOW (ref 13.0–17.0)
Immature Granulocytes: 0 %
Lymphocytes Relative: 26 %
Lymphs Abs: 3.6 10*3/uL (ref 0.7–4.0)
MCH: 26 pg (ref 26.0–34.0)
MCHC: 31.1 g/dL (ref 30.0–36.0)
MCV: 83.8 fL (ref 80.0–100.0)
Monocytes Absolute: 1.4 10*3/uL — ABNORMAL HIGH (ref 0.1–1.0)
Monocytes Relative: 10 %
Neutro Abs: 8.9 10*3/uL — ABNORMAL HIGH (ref 1.7–7.7)
Neutrophils Relative %: 62 %
Platelets: 675 10*3/uL — ABNORMAL HIGH (ref 150–400)
RBC: 4.07 MIL/uL — ABNORMAL LOW (ref 4.22–5.81)
RDW: 15.7 % — ABNORMAL HIGH (ref 11.5–15.5)
WBC: 14.1 10*3/uL — ABNORMAL HIGH (ref 4.0–10.5)
nRBC: 0 % (ref 0.0–0.2)

## 2020-12-01 LAB — GLUCOSE, CAPILLARY: Glucose-Capillary: 104 mg/dL — ABNORMAL HIGH (ref 70–99)

## 2020-12-01 LAB — PROTIME-INR
INR: 1.4 — ABNORMAL HIGH (ref 0.8–1.2)
Prothrombin Time: 16.1 seconds — ABNORMAL HIGH (ref 11.4–15.2)

## 2020-12-01 MED ORDER — LIDOCAINE-EPINEPHRINE (PF) 2 %-1:200000 IJ SOLN
INTRAMUSCULAR | Status: AC
  Filled 2020-12-01: qty 20

## 2020-12-01 MED ORDER — FENTANYL CITRATE (PF) 100 MCG/2ML IJ SOLN
INTRAMUSCULAR | Status: AC | PRN
  Administered 2020-12-01 (×2): 50 ug via INTRAVENOUS

## 2020-12-01 MED ORDER — SODIUM CHLORIDE 0.9 % IV SOLN: INTRAVENOUS | Status: DC

## 2020-12-01 MED ORDER — GELATIN ABSORBABLE 12-7 MM EX MISC
CUTANEOUS | Status: AC
  Filled 2020-12-01: qty 1

## 2020-12-01 MED ORDER — MIDAZOLAM HCL 2 MG/2ML IJ SOLN
INTRAMUSCULAR | Status: AC
  Filled 2020-12-01: qty 4

## 2020-12-01 MED ORDER — FENTANYL CITRATE (PF) 100 MCG/2ML IJ SOLN
INTRAMUSCULAR | Status: AC
  Filled 2020-12-01: qty 2

## 2020-12-01 MED ORDER — MIDAZOLAM HCL 2 MG/2ML IJ SOLN
INTRAMUSCULAR | Status: AC | PRN
  Administered 2020-12-01 (×2): 1 mg via INTRAVENOUS

## 2020-12-01 NOTE — Consult Note (Signed)
Chief Complaint: Patient was seen in consultation today for image guided liver lesion biopsy  Referring Physician(s): Curcio,Kristin R/Shadad,F  Supervising Physician: Sandi Mariscal  Patient Status: [facility] - Out-pt  History of Present Illness: Chad Cantu is a 69 y.o. male with past medical history of diabetes, hyperlipidemia, hypertension, substance abuse, chronic abdominal pain, weight loss, diminished appetite and recent imaging studies revealing large left lower pole renal mass with adjacent subacute hematoma, 2 mm nonobstructing renal stone within the left kidney, right adrenal mass most consistent with adrenal adenoma, small bilateral pleural effusions and liver lesions.  Patient has no known history of cancer.  He presents today for image guided liver lesion biopsy for further evaluation.  Past Medical History:  Diagnosis Date  . Diabetes mellitus without complication ([place])   . ETOH abuse   . Hyperlipidemia   . Hypertension   . Substance abuse ([place])    ETOH, cocaine.  Remission    Past Surgical History:  Procedure Laterality Date  . COLONOSCOPY  5 years ago?   at [place] maybe per pt,?unsure results  . LAPAROSCOPIC GASTROTOMY W/ REPAIR OF ULCER      Allergies: Patient has no known allergies.  Medications: Prior to Admission medications   Medication Sig Start Date End Date Taking? Authorizing Provider  Ascorbic Acid (VITAMIN C PO) Take by mouth.   Yes [provider]  atorvastatin (LIPITOR) 20 MG tablet Take 1 tablet (20 mg total) by mouth daily. 06/05/20  Yes [name], MD  metFORMIN (GLUCOPHAGE) 500 MG tablet Take 1 tablet (500 mg total) by mouth daily with breakfast. 06/05/20  Yes [name], MD  acetaminophen (TYLENOL) 325 MG tablet Take 2 tablets (650 mg total) by mouth every 6 (six) hours as needed for mild pain (or Fever >/= 101). 11/22/20   Domenic Polite, MD  lisinopril (ZESTRIL) 20 MG tablet Take 20 mg by mouth daily. 03/26/20 09/20/20   [provider]     Family History  Problem Relation Age of Onset  . Diabetes Mother   . Colon cancer Neg Hx   . Colon polyps Neg Hx   . Esophageal cancer Neg Hx   . Rectal cancer Neg Hx   . Stomach cancer Neg Hx     Social History   Socioeconomic History  . Marital status: Married    Spouse name: Not on file  . Number of children: Not on file  . Years of education: Not on file  . Highest education level: Not on file  Occupational History  . Occupation: security guard  Tobacco Use  . Smoking status: Current Every Day Smoker    Packs/day: 2.00    Years: 55.00    Pack years: 110.00    Types: Cigarettes  . Smokeless tobacco: Never Used  Vaping Use  . Vaping Use: Never used  Substance and Sexual Activity  . Alcohol use: Yes    Alcohol/week: 12.0 standard drinks    Types: 12 Cans of beer per week  . Drug use: Yes    Types: Marijuana    Comment: marijuana maybe 2 months ago, remote h/o cocaine use  . Sexual activity: Yes  Other Topics Concern  . Not on file  Social History Narrative  . Not on file   Social Determinants of Health   Financial Resource Strain: Not on file  Food Insecurity: Not on file  Transportation Needs: Not on file  Physical Activity: Not on file  Stress: Not on file  Social Connections:  Not on file      Review of Systems see above ;currently denies fever, headache, chest pain, dyspnea, cough, back pain, nausea, vomiting or bleeding; does have some occasional lightheadedness upon rising from recumbent position  Vital Signs: BP 94/63   Pulse 91   Temp 98 F (36.7 C) (Oral)   Resp 16   SpO2 95%   Physical Exam awake, alert.  Chest with slightly diminished breath sounds bases.  Heart with regular rate and rhythm.  Abdomen soft, positive bowel sounds, currently nontender.  No lower extremity edema.  Imaging: MR ABDOMEN W WO CONTRAST  Result Date: 11/21/2020 CLINICAL DATA:  Evaluate left renal lesions seen CT scan. EXAM: MRI  ABDOMEN WITHOUT AND WITH CONTRAST TECHNIQUE: Multiplanar multisequence MR imaging of the abdomen was performed both before and after the administration of intravenous contrast. CONTRAST:  7.42mL GADAVIST GADOBUTROL 1 MMOL/ML IV SOLN COMPARISON:  CT scan 11/19/2020 FINDINGS: Lower chest: The lung bases are grossly clear. No pulmonary lesions or pleural or pericardial effusion. Hepatobiliary: Hepatic metastatic lesions are identified. 2.1 cm segment 7 lesion on image 29/15. 16 mm segment 6 lesion on image 47/15. 13 mm segment 6 lesion image 59/17. Other smaller scattered lesions in both lobes. The gallbladder is unremarkable. No intra or extrahepatic biliary dilatation. Pancreas:  No mass, inflammation or ductal dilatation. Spleen:  Normal size. No focal lesions. Adrenals/Urinary Tract: Small right adrenal gland nodule has MR imaging features of a benign adenoma. As demonstrated on the CT scan there is a large heterogeneously enhancing mass associated with the lower pole region of left kidney. This measures 5.8 x 5.5 x 4.9 cm. Associated perinephric hematoma as demonstrated on the CT scan measuring approximately 6 x 5 cm. The left renal vein is patent. No retroperitoneal lymphadenopathy is identified. Stomach/Bowel: The stomach, duodenum, visualized small bowel and visualize colon are grossly normal. Vascular/Lymphatic: The aorta and branch vessels are normal. The major venous structures are patent. No mesenteric or retroperitoneal lymphadenopathy. Other:  No ascites or abdominal wall hernia. Musculoskeletal: No significant bony findings. No findings suspicious for osseous metastatic disease. IMPRESSION: 1. 5.8 x 5.5 x 4.9 cm heterogeneously enhancing mass associated with the lower pole region of the left kidney. This is consistent with renal cell neoplasm. No evidence of renal vein involvement or retroperitoneal lymphadenopathy. 2. Hepatic metastatic lesions. 3. Small right adrenal gland nodule consistent with a  benign adenoma. Electronically Signed   By: Marijo Sanes M.D.   On: 11/21/2020 06:02   CT ABDOMEN PELVIS W CONTRAST  Result Date: 11/19/2020 CLINICAL DATA:  Chronic abdominal pain and weight loss. EXAM: CT ABDOMEN AND PELVIS WITH CONTRAST TECHNIQUE: Multidetector CT imaging of the abdomen and pelvis was performed using the standard protocol following bolus administration of intravenous contrast. CONTRAST:  163mL OMNIPAQUE IOHEXOL 300 MG/ML  SOLN COMPARISON:  None. FINDINGS: Lower chest: A trace amount of atelectasis is seen within the posterior aspect of the bilateral lung bases. Very small bilateral pleural effusions are noted. Hepatobiliary: A 1.3 cm x 0.9 cm focus of ill-defined low attenuation, with faint surrounding hyperdense rim, is seen within the posterior aspect of the right lobe of the liver. No gallstones, gallbladder wall thickening, or biliary dilatation. Pancreas: Unremarkable. No pancreatic ductal dilatation or surrounding inflammatory changes. Spleen: Normal in size without focal abnormality. Adrenals/Urinary Tract: A 1.2 cm x 0.9 cm low-attenuation right adrenal mass is seen. The left adrenal gland is normal in appearance. Kidneys are normal in size, without obstructing renal calculi or hydronephrosis.  A 2 mm nonobstructing renal stone is seen within the mid left kidney. A 5.7 cm x 5.6 cm x 5.4 cm area of heterogeneous low attenuation is seen within the lower pole of the left kidney. A moderate amount of associated perinephric inflammatory fat stranding is seen. An adjacent 5.5 cm x 4.7 cm x 3.6 cm area of mildly increased attenuation is seen adjacent to the lower pole of the left kidney. The urinary bladder is unremarkable. Stomach/Bowel: Stomach is within normal limits. Appendix appears normal. Surgically anastomosed bowel is seen within the anterior aspect of the mid abdomen. No evidence of bowel wall thickening, distention, or inflammatory changes. Vascular/Lymphatic: Aortic  atherosclerosis. No enlarged abdominal or pelvic lymph nodes. Reproductive: The prostate gland is moderately enlarged. Other: No abdominal wall hernia or abnormality. No abdominopelvic ascites. Musculoskeletal: There is approximately 3 mm retrolisthesis of the L5 vertebral body on S1. IMPRESSION: 1. Findings consistent with a large mass within the lower pole of the left kidney, with an adjacent subacute hematoma. While no evidence of active bleeding is identified, MRI correlation is recommended to further confirm the presence of an underlying neoplasm. 2. 2 mm nonobstructing renal stone within the left kidney. 3. Low-attenuation right adrenal mass likely consistent with an adrenal adenoma. 4. Very small bilateral pleural effusions. 5. Findings which may represent a hepatic hemangioma versus solitary metastatic lesion. MRI correlation is again recommended. Aortic Atherosclerosis (ICD10-I70.0). Electronically Signed   By: Virgina Norfolk M.D.   On: 11/19/2020 16:13   DG CHEST PORT 1 VIEW  Result Date: 11/18/2020 CLINICAL DATA:  Fever. EXAM: PORTABLE CHEST 1 VIEW COMPARISON:  May 04, 2016 FINDINGS: The heart size and mediastinal contours are within normal limits. There is moderate severity calcification of the aortic arch. Both lungs are clear. The visualized skeletal structures are unremarkable. IMPRESSION: No active disease. Electronically Signed   By: Virgina Norfolk M.D.   On: 11/18/2020 17:14    Labs:  CBC: Recent Labs    11/19/20 1643 11/20/20 0245 11/21/20 0448 12/01/20 1200  WBC 11.2* 9.8 10.0 14.1*  HGB 10.2* 10.4* 10.4* 10.6*  HCT 32.1* 33.0* 30.9* 34.1*  PLT 388 362 413* 675*    COAGS: Recent Labs    11/20/20 0245 12/01/20 1200  INR 1.2 1.4*    BMP: Recent Labs    06/05/20 1618 09/20/20 1531 11/19/20 0340 11/20/20 0245 11/21/20 0448 12/01/20 1200  NA 141   < > 137 134* 137 138  K 4.0   < > 4.2 4.0 3.8 4.2  CL 104   < > 100 97* 102 100  CO2 24   < > 27 26 25 23    GLUCOSE 89   < > 103* 122* 114* 109*  BUN 7*   < > 10 8 11  31*  CALCIUM 9.2   < > 10.8* 10.6* 10.6* 11.4*  CREATININE 0.61*   < > 0.79 0.80 0.67 1.35*  GFRNONAA 103   < > >60 >60 >60 58*  GFRAA 119  --   --   --   --   --    < > = values in this interval not displayed.    LIVER FUNCTION TESTS: Recent Labs    11/18/20 0117 11/19/20 0340 11/20/20 0245 12/01/20 1200  BILITOT 0.5 0.5 0.5 0.5  AST 39 26 25 26   ALT 48* 39 36 43  ALKPHOS 80 71 72 100  PROT 7.2 7.1 7.3 9.4*  ALBUMIN 1.9* 1.8* 1.7* 2.6*    TUMOR MARKERS: No  results for input(s): AFPTM, CEA, CA199, CHROMGRNA in the last 8760 hours.  Assessment and Plan: 68 y.o. male with past medical history of diabetes, hyperlipidemia, hypertension, substance abuse, chronic abdominal pain, weight loss, diminished appetite and recent imaging studies revealing large left lower pole renal mass with adjacent subacute hematoma, 2 mm nonobstructing renal stone within the left kidney, right adrenal mass most consistent with adrenal adenoma, small bilateral pleural effusions and liver lesions.  Patient has no known history of cancer.  He presents today for image guided liver lesion biopsy for further evaluation. Creat up some today to 1.35,? dehydrated- will increase IVF rate.   Thank you for this interesting consult.  I greatly enjoyed meeting Chad Cantu and look forward to participating in their care.  A copy of this report was sent to the requesting provider on this date.  Electronically Signed: D. Rowe Robert, PA-C 12/01/2020, 12:35 PM   I spent a total of 25 minutes in face to face in clinical consultation, greater than 50% of which was counseling/coordinating care for image guided liver lesion biopsy

## 2020-12-01 NOTE — Procedures (Signed)
Pre Procedure Dx: Concern for metastatic RCC Post Procedural Dx: Same  Technically successful US guided biopsy of indeterminate lesion within the right lobe of the liver.  EBL: Trace No immediate complications.   Ronny Bacon, MD Pager #: [phone]

## 2020-12-01 NOTE — Discharge Instructions (Signed)
For questions /concerns may call Interventional Radiology at [phone]     Liver Biopsy, Care After These instructions give you information on caring for yourself after your procedure. Your doctor may also give you more specific instructions. Call your doctor if you have any problems or questions after your procedure. What can I expect after the procedure? After the procedure, it is common to have:  Pain and soreness where the biopsy was done.  Bruising around the area where the biopsy was done.  Sleepiness and be tired for a few days. Follow these instructions at home: Medicines  Take over-the-counter and prescription medicines only as told by your doctor.  If you were prescribed an antibiotic medicine, take it as told by your doctor. Do not stop taking the antibiotic even if you start to feel better.  Do not take medicines such as aspirin and ibuprofen. These medicines can thin your blood. Do not take these medicines unless your doctor tells you to take them.  If you are taking prescription pain medicine, take actions to prevent or treat constipation. Your doctor may recommend that you: ? Drink enough fluid to keep your pee (urine) clear or pale yellow. ? Take over-the-counter or prescription medicines. ? Eat foods that are high in fiber, such as fresh fruits and vegetables, whole grains, and beans. ? Limit foods that are high in fat and processed sugars, such as fried and sweet foods. Caring for your cut  Follow instructions from your doctor about how to take care of your cuts from surgery (incisions). Make sure you: ? Wash your hands with soap and water before you change your bandage (dressing). If you cannot use soap and water, use hand sanitizer. ? Change your bandage as told by your doctor. ? Leave stitches (sutures), skin glue, or skin tape (adhesive) strips in place. They may need to stay in place for 2 weeks or longer. If tape strips get loose and curl up, you may trim  the loose edges. Do not remove tape strips completely unless your doctor says it is okay.  Check your cuts every day for signs of infection. Check for: ? Redness, swelling, or more pain. ? Fluid or blood. ? Pus or a bad smell. ? Warmth.  Do not take baths, swim, or use a hot tub until your doctor says it is okay to do so. Activity   Rest at home for 1-2 days or as told by your doctor. ? Avoid sitting for a long time without moving. Get up to take short walks every 1-2 hours.  Return to your normal activities as told by your doctor. Ask what activities are safe for you.  Do not do these things in the first 24 hours: ? Drive. ? Use machinery. ? Take a bath or shower.  Do not lift more than 10 pounds (4.5 kg) or play contact sports for the first 2 weeks. General instructions   Do not drink alcohol in the first week after the procedure.  Have someone stay with you for at least 24 hours after the procedure.  Get your test results. Ask your doctor or the department that is doing the test: ? When will my results be ready? ? How will I get my results? ? What are my treatment options? ? What other tests do I need? ? What are my next steps?  Keep all follow-up visits as told by your doctor. This is important. Contact a doctor if:  A cut bleeds and leaves more than  just a small spot of blood.  A cut is red, puffs up (swells), or hurts more than before.  Fluid or something else comes from a cut.  A cut smells bad.  You have a fever or chills. Get help right away if:  You have swelling, bloating, or pain in your belly (abdomen).  You get dizzy or faint.  You have a rash.  You feel sick to your stomach (nauseous) or throw up (vomit).  You have trouble breathing, feel short of breath, or feel faint.  Your chest hurts.  You have problems talking or seeing.  You have trouble with your balance or moving your arms or legs. Summary  After the procedure, it is common to  have pain, soreness, bruising, and tiredness.  Your doctor will tell you how to take care of yourself at home. Change your bandage, take your medicines, and limit your activities as told by your doctor.  Call your doctor if you have symptoms of infection. Get help right away if your belly swells, your cut bleeds a lot, or you have trouble talking or breathing. This information is not intended to replace advice given to you by your health care provider. Make sure you discuss any questions you have with your health care provider. Document Revised: 12/09/2017 Document Reviewed: 12/09/2017 Elsevier Patient Education  [place]. Moderate Conscious Sedation, Adult, Care After These instructions provide you with information about caring for yourself after your procedure. Your health care provider may also give you more specific instructions. Your treatment has been planned according to current medical practices, but problems sometimes occur. Call your health care provider if you have any problems or questions after your procedure. What can I expect after the procedure? After your procedure, it is common:  To feel sleepy for several hours.  To feel clumsy and have poor balance for several hours.  To have poor judgment for several hours.  To vomit if you eat too soon. Follow these instructions at home: For at least 24 hours after the procedure:   Do not: ? Participate in activities where you could fall or become injured. ? Drive. ? Use heavy machinery. ? Drink alcohol. ? Take sleeping pills or medicines that cause drowsiness. ? Make important decisions or sign legal documents. ? Take care of children on your own.  Rest. Eating and drinking  Follow the diet recommended by your health care provider.  If you vomit: ? Drink water, juice, or soup when you can drink without vomiting. ? Make sure you have little or no nausea before eating solid foods. General instructions  Have a  responsible adult stay with you until you are awake and alert.  Take over-the-counter and prescription medicines only as told by your health care provider.  If you smoke, do not smoke without supervision.  Keep all follow-up visits as told by your health care provider. This is important. Contact a health care provider if:  You keep feeling nauseous or you keep vomiting.  You feel light-headed.  You develop a rash.  You have a fever. Get help right away if:  You have trouble breathing. This information is not intended to replace advice given to you by your health care provider. Make sure you discuss any questions you have with your health care provider. Document Revised: 11/11/2017 Document Reviewed: 03/20/2016 Elsevier Patient Education  2020 [employer].

## 2020-12-02 LAB — SURGICAL PATHOLOGY

## 2020-12-09 ENCOUNTER — Inpatient Hospital Stay: Payer: Medicare Other | Attending: Oncology | Admitting: Oncology

## 2020-12-09 ENCOUNTER — Observation Stay (HOSPITAL_COMMUNITY)
Admission: EM | Admit: 2020-12-09 | Discharge: 2020-12-10 | Disposition: A | Discharge status: Home / Self Care | Payer: Medicare Other | Attending: Internal Medicine | Admitting: Internal Medicine

## 2020-12-09 ENCOUNTER — Other Ambulatory Visit: Payer: Self-pay

## 2020-12-09 ENCOUNTER — Other Ambulatory Visit: Payer: Self-pay | Admitting: Oncology

## 2020-12-09 ENCOUNTER — Telehealth: Payer: Self-pay

## 2020-12-09 ENCOUNTER — Emergency Department (HOSPITAL_COMMUNITY): Payer: Medicare Other

## 2020-12-09 ENCOUNTER — Encounter (HOSPITAL_COMMUNITY): Payer: Self-pay

## 2020-12-09 VITALS — BP 130/109 | HR 186 | Temp 97.9°F | Resp 20 | Ht 66.0 in | Wt 140.2 lb

## 2020-12-09 DIAGNOSIS — C7800 Secondary malignant neoplasm of unspecified lung: Secondary | ICD-10-CM | POA: Insufficient documentation

## 2020-12-09 DIAGNOSIS — F17210 Nicotine dependence, cigarettes, uncomplicated: Secondary | ICD-10-CM | POA: Diagnosis not present

## 2020-12-09 DIAGNOSIS — I700 Atherosclerosis of aorta: Secondary | ICD-10-CM | POA: Insufficient documentation

## 2020-12-09 DIAGNOSIS — R531 Weakness: Secondary | ICD-10-CM | POA: Insufficient documentation

## 2020-12-09 DIAGNOSIS — Z7189 Other specified counseling: Secondary | ICD-10-CM

## 2020-12-09 DIAGNOSIS — I6782 Cerebral ischemia: Secondary | ICD-10-CM | POA: Insufficient documentation

## 2020-12-09 DIAGNOSIS — J3489 Other specified disorders of nose and nasal sinuses: Secondary | ICD-10-CM | POA: Insufficient documentation

## 2020-12-09 DIAGNOSIS — E032 Hypothyroidism due to medicaments and other exogenous substances: Secondary | ICD-10-CM

## 2020-12-09 DIAGNOSIS — Z7984 Long term (current) use of oral hypoglycemic drugs: Secondary | ICD-10-CM | POA: Diagnosis not present

## 2020-12-09 DIAGNOSIS — J018 Other acute sinusitis: Secondary | ICD-10-CM

## 2020-12-09 DIAGNOSIS — F1010 Alcohol abuse, uncomplicated: Secondary | ICD-10-CM

## 2020-12-09 DIAGNOSIS — R918 Other nonspecific abnormal finding of lung field: Secondary | ICD-10-CM | POA: Diagnosis not present

## 2020-12-09 DIAGNOSIS — N179 Acute kidney failure, unspecified: Secondary | ICD-10-CM | POA: Diagnosis not present

## 2020-12-09 DIAGNOSIS — D638 Anemia in other chronic diseases classified elsewhere: Secondary | ICD-10-CM | POA: Diagnosis not present

## 2020-12-09 DIAGNOSIS — D75839 Thrombocytosis, unspecified: Secondary | ICD-10-CM | POA: Diagnosis not present

## 2020-12-09 DIAGNOSIS — F1910 Other psychoactive substance abuse, uncomplicated: Secondary | ICD-10-CM | POA: Diagnosis not present

## 2020-12-09 DIAGNOSIS — Z20822 Contact with and (suspected) exposure to covid-19: Secondary | ICD-10-CM | POA: Diagnosis not present

## 2020-12-09 DIAGNOSIS — E7849 Other hyperlipidemia: Secondary | ICD-10-CM

## 2020-12-09 DIAGNOSIS — J323 Chronic sphenoidal sinusitis: Secondary | ICD-10-CM | POA: Insufficient documentation

## 2020-12-09 DIAGNOSIS — C649 Malignant neoplasm of unspecified kidney, except renal pelvis: Secondary | ICD-10-CM | POA: Diagnosis not present

## 2020-12-09 DIAGNOSIS — E785 Hyperlipidemia, unspecified: Secondary | ICD-10-CM | POA: Diagnosis not present

## 2020-12-09 DIAGNOSIS — R519 Headache, unspecified: Secondary | ICD-10-CM | POA: Diagnosis present

## 2020-12-09 DIAGNOSIS — I313 Pericardial effusion (noninflammatory): Secondary | ICD-10-CM | POA: Insufficient documentation

## 2020-12-09 DIAGNOSIS — Z720 Tobacco use: Secondary | ICD-10-CM

## 2020-12-09 DIAGNOSIS — E119 Type 2 diabetes mellitus without complications: Secondary | ICD-10-CM | POA: Insufficient documentation

## 2020-12-09 DIAGNOSIS — I10 Essential (primary) hypertension: Secondary | ICD-10-CM | POA: Diagnosis not present

## 2020-12-09 DIAGNOSIS — F10230 Alcohol dependence with withdrawal, uncomplicated: Secondary | ICD-10-CM | POA: Diagnosis not present

## 2020-12-09 DIAGNOSIS — Z79899 Other long term (current) drug therapy: Secondary | ICD-10-CM | POA: Insufficient documentation

## 2020-12-09 DIAGNOSIS — R197 Diarrhea, unspecified: Secondary | ICD-10-CM | POA: Insufficient documentation

## 2020-12-09 DIAGNOSIS — D72829 Elevated white blood cell count, unspecified: Secondary | ICD-10-CM | POA: Diagnosis present

## 2020-12-09 DIAGNOSIS — R112 Nausea with vomiting, unspecified: Secondary | ICD-10-CM

## 2020-12-09 DIAGNOSIS — E860 Dehydration: Secondary | ICD-10-CM | POA: Insufficient documentation

## 2020-12-09 LAB — BASIC METABOLIC PANEL
Anion gap: 15 (ref 5–15)
BUN: 22 mg/dL (ref 8–23)
CO2: 23 mmol/L (ref 22–32)
Calcium: 11 mg/dL — ABNORMAL HIGH (ref 8.9–10.3)
Chloride: 101 mmol/L (ref 98–111)
Creatinine, Ser: 1.74 mg/dL — ABNORMAL HIGH (ref 0.61–1.24)
GFR, Estimated: 42 mL/min — ABNORMAL LOW (ref >60)
Glucose, Bld: 125 mg/dL — ABNORMAL HIGH (ref 70–99)
Potassium: 3.7 mmol/L (ref 3.5–5.1)
Sodium: 139 mmol/L (ref 135–145)

## 2020-12-09 LAB — CBC WITH DIFFERENTIAL/PLATELET
Abs Immature Granulocytes: 0.13 10*3/uL — ABNORMAL HIGH (ref 0.00–0.07)
Basophils Absolute: 0.1 10*3/uL (ref 0.0–0.1)
Basophils Relative: 1 %
Eosinophils Absolute: 0 10*3/uL (ref 0.0–0.5)
Eosinophils Relative: 0 %
HCT: 31.5 % — ABNORMAL LOW (ref 39.0–52.0)
Hemoglobin: 9.8 g/dL — ABNORMAL LOW (ref 13.0–17.0)
Immature Granulocytes: 1 %
Lymphocytes Relative: 11 %
Lymphs Abs: 2.1 10*3/uL (ref 0.7–4.0)
MCH: 25.9 pg — ABNORMAL LOW (ref 26.0–34.0)
MCHC: 31.1 g/dL (ref 30.0–36.0)
MCV: 83.1 fL (ref 80.0–100.0)
Monocytes Absolute: 2 10*3/uL — ABNORMAL HIGH (ref 0.1–1.0)
Monocytes Relative: 10 %
Neutro Abs: 14.5 10*3/uL — ABNORMAL HIGH (ref 1.7–7.7)
Neutrophils Relative %: 77 %
Platelets: 539 10*3/uL — ABNORMAL HIGH (ref 150–400)
RBC: 3.79 MIL/uL — ABNORMAL LOW (ref 4.22–5.81)
RDW: 15.8 % — ABNORMAL HIGH (ref 11.5–15.5)
WBC: 18.9 10*3/uL — ABNORMAL HIGH (ref 4.0–10.5)
nRBC: 0 % (ref 0.0–0.2)

## 2020-12-09 LAB — ETHANOL: Alcohol, Ethyl (B): <10 mg/dL (ref <10)

## 2020-12-09 LAB — RAPID URINE DRUG SCREEN, HOSP PERFORMED
Amphetamines: NOT DETECTED
Barbiturates: NOT DETECTED
Benzodiazepines: POSITIVE — AB
Cocaine: NOT DETECTED
Opiates: NOT DETECTED
Tetrahydrocannabinol: NOT DETECTED

## 2020-12-09 LAB — HEMOGLOBIN A1C
Hgb A1c MFr Bld: 6.1 % — ABNORMAL HIGH (ref 4.8–5.6)
Mean Plasma Glucose: 128.37 mg/dL

## 2020-12-09 LAB — TROPONIN I (HIGH SENSITIVITY)
Troponin I (High Sensitivity): 5 ng/L (ref <18)
Troponin I (High Sensitivity): 6 ng/L (ref <18)

## 2020-12-09 LAB — SARS CORONAVIRUS 2 (TAT 6-24 HRS): SARS Coronavirus 2: NEGATIVE

## 2020-12-09 LAB — HEPATIC FUNCTION PANEL
ALT: 35 U/L (ref 0–44)
AST: 27 U/L (ref 15–41)
Albumin: 2.6 g/dL — ABNORMAL LOW (ref 3.5–5.0)
Alkaline Phosphatase: 97 U/L (ref 38–126)
Bilirubin, Direct: <0.1 mg/dL (ref 0.0–0.2)
Total Bilirubin: 0.3 mg/dL (ref 0.3–1.2)
Total Protein: 9.2 g/dL — ABNORMAL HIGH (ref 6.5–8.1)

## 2020-12-09 LAB — GLUCOSE, CAPILLARY
Glucose-Capillary: 148 mg/dL — ABNORMAL HIGH (ref 70–99)
Glucose-Capillary: 128 mg/dL — ABNORMAL HIGH (ref 70–99)

## 2020-12-09 MED ORDER — SODIUM CHLORIDE 0.9 % IV SOLN
3.0000 g | Freq: Four times a day (QID) | INTRAVENOUS | Status: DC
  Administered 2020-12-09 – 2020-12-10 (×4): 3 g via INTRAVENOUS
  Filled 2020-12-09 (×2): qty 3
  Filled 2020-12-09 (×2): qty 8
  Filled 2020-12-09 (×2): qty 3

## 2020-12-09 MED ORDER — ACETAMINOPHEN 500 MG PO TABS
1000.0000 mg | ORAL_TABLET | Freq: Once | ORAL | Status: AC
  Administered 2020-12-09: 13:00:00 1000 mg via ORAL
  Filled 2020-12-09: qty 2

## 2020-12-09 MED ORDER — ATORVASTATIN CALCIUM 20 MG PO TABS: 20.0000 mg | ORAL_TABLET | Freq: Every day | ORAL | Status: DC

## 2020-12-09 MED ORDER — ONDANSETRON HCL 4 MG/2ML IJ SOLN: 4.0000 mg | Freq: Four times a day (QID) | INTRAMUSCULAR | Status: DC | PRN

## 2020-12-09 MED ORDER — SODIUM CHLORIDE 0.9 % IV SOLN: INTRAVENOUS | Status: DC

## 2020-12-09 MED ORDER — PROCHLORPERAZINE MALEATE 10 MG PO TABS: 10.0000 mg | ORAL_TABLET | Freq: Four times a day (QID) | ORAL | 0 refills | Status: DC | PRN

## 2020-12-09 MED ORDER — INSULIN ASPART 100 UNIT/ML ~~LOC~~ SOLN: 0.0000 [IU] | Freq: Every day | SUBCUTANEOUS | Status: DC

## 2020-12-09 MED ORDER — SODIUM CHLORIDE 0.9 % IV SOLN: Freq: Once | INTRAVENOUS | Status: AC

## 2020-12-09 MED ORDER — IOHEXOL 300 MG/ML  SOLN
75.0000 mL | Freq: Once | INTRAMUSCULAR | Status: AC | PRN
  Administered 2020-12-09: 14:00:00 60 mL via INTRAVENOUS

## 2020-12-09 MED ORDER — ENSURE ENLIVE PO LIQD
237.0000 mL | Freq: Two times a day (BID) | ORAL | Status: DC
  Administered 2020-12-10: 08:00:00 237 mL via ORAL

## 2020-12-09 MED ORDER — ENOXAPARIN SODIUM 40 MG/0.4ML ~~LOC~~ SOLN
40.0000 mg | SUBCUTANEOUS | Status: DC
  Administered 2020-12-09: 21:00:00 40 mg via SUBCUTANEOUS
  Filled 2020-12-09: qty 0.4

## 2020-12-09 MED ORDER — ACETAMINOPHEN 325 MG PO TABS: 650.0000 mg | ORAL_TABLET | Freq: Four times a day (QID) | ORAL | Status: DC | PRN

## 2020-12-09 MED ORDER — INSULIN ASPART 100 UNIT/ML ~~LOC~~ SOLN
0.0000 [IU] | Freq: Three times a day (TID) | SUBCUTANEOUS | Status: DC
  Administered 2020-12-09: 18:00:00 1 [IU] via SUBCUTANEOUS

## 2020-12-09 MED ORDER — ONDANSETRON HCL 4 MG PO TABS: 4.0000 mg | ORAL_TABLET | Freq: Four times a day (QID) | ORAL | Status: DC | PRN

## 2020-12-09 MED ORDER — ACETAMINOPHEN 650 MG RE SUPP: 650.0000 mg | Freq: Four times a day (QID) | RECTAL | Status: DC | PRN

## 2020-12-09 MED ORDER — SODIUM CHLORIDE 0.9 % IV BOLUS
1000.0000 mL | Freq: Once | INTRAVENOUS | Status: AC
  Administered 2020-12-09: 12:00:00 1000 mL via INTRAVENOUS

## 2020-12-09 MED FILL — PROCHLORPERAZINE 10 MG TAB: 10 | 7 days supply | Qty: 30 | Fill #0

## 2020-12-09 NOTE — Progress Notes (Signed)
START ON PATHWAY REGIMEN - Renal Cell     A cycle is every 21 days:     Nivolumab      Ipilimumab    A cycle is every 28 days:     Nivolumab   **Always confirm dose/schedule in your pharmacy ordering system**  Patient Characteristics: Stage IV/Metastatic Disease, Clear Cell, First Line, Intermediate or Poor Risk Therapeutic Status: Stage IV/Metastatic Disease Histology: Clear Cell Line of Therapy: First Line Risk Status: Intermediate Risk Intent of Therapy: Non-Curative / Palliative Intent, Discussed with Patient 

## 2020-12-09 NOTE — ED Notes (Signed)
Per Dr Clelia Croft, sending patient to ED for possible ETOH withdraw-HR 180's-

## 2020-12-09 NOTE — H&P (Signed)
History and Physical    Chad Cantu [id number] DOB: [birth year] DOA: 12/09/2020  PCP: [name], MD   Patient coming from: Home  I have personally briefly reviewed patient's old medical records in [place]  Chief Complaint: Nausea, vomiting and diarrhea  HPI: Chad Cantu is a 69 y.o. 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 Hazeline Junker clinic today for assessment and consideration for initiation of treatment.  He was complaining of nausea, vomiting and diarrhea for the last few days which has gotten worse.  Patient reports that he has had 4-5 episodes of watery diarrhea today.  Complains of some intermittent abdominal pain along with intermittent nausea and vomiting.  Patient complains of poor appetite, weakness and dizziness.  No loss of consciousness, seizures, syncope, dysuria, increased frequency of urination.  Patient has not had any alcohol for the last almost 3 weeks.  Patient denies blurry vision but complains of headaches.  He had fever through 4 days ago.  He has had 2 doses of vaccine for COVID-19 so far.  Denies any chest pain, orthopnea or leg edema or palpitations, rashes.  In Dr[facility] clinic, he was found to be diaphoretic, tachycardic with blood pressure on the lower side for which she was sent to the ED.  ED Course: He was hemodynamically stable with slight lower blood pressure but afebrile. Hospitalist service was called to evaluate the patient.  He was found to have AKI with creatinine of 1.74 and white blood cell count of 18.9.  CT of the head without contrast showed no acute intracranial abnormality but  showed findings of right sphenoid sinusitis and mild ethmoid sinus mucosal thickening.  CT of the chest with contrast showed multiple pulmonary nodules suspicious for metastatic disease.  He was given IV fluids.  COVID-19 test is pending. Hospitalist service was called to evaluate the patient.  Review of Systems: As per HPI otherwise all other systems were reviewed and are negative.   Past Medical History:  Diagnosis Date  . Diabetes mellitus without complication ([place])   . ETOH abuse   . Hyperlipidemia   . Hypertension   . Substance abuse ([place])    ETOH, cocaine.  Remission    Past Surgical History:  Procedure Laterality Date  . COLONOSCOPY  5 years ago?   at [place] maybe per pt,?unsure results  . LAPAROSCOPIC GASTROTOMY W/ REPAIR OF ULCER     Social history  reports that he has been smoking cigarettes. He has a 110.00 pack-year smoking history. He has never used smokeless tobacco. He reports current alcohol use of about 12.0 standard drinks of alcohol per week. He reports current drug use. Drug: Marijuana.  Wife states that patient has not had alcohol for almost 3 weeks now  No Known Allergies  Family History  Problem Relation Age of Onset  . Diabetes Mother   . Colon cancer Neg Hx   . Colon polyps Neg Hx   . Esophageal cancer Neg Hx   . Rectal cancer Neg Hx   . Stomach cancer Neg Hx     Prior to Admission medications   Medication Sig Start Date End Date Taking? Authorizing Provider  acetaminophen (TYLENOL) 325 MG tablet Take 2 tablets (650 mg total)  by mouth every 6 (six) hours as needed for mild pain (or Fever >/= 101). 11/22/20  Yes Domenic Polite, MD  Ascorbic Acid (VITAMIN C PO) Take by mouth.   Yes [provider]  atorvastatin (LIPITOR) 20 MG tablet Take 1 tablet (20 mg total) by mouth daily. 06/05/20  Yes [name], MD  metFORMIN (GLUCOPHAGE) 500 MG tablet Take 1 tablet (500 mg total) by mouth daily with breakfast. 06/05/20  Yes [name],  MD  prochlorperazine (COMPAZINE) 10 MG tablet Take 1 tablet (10 mg total) by mouth every 6 (six) hours as needed for nausea or vomiting. 12/09/20   [name], MD  lisinopril (ZESTRIL) 20 MG tablet Take 20 mg by mouth daily. 03/26/20 09/20/20  [provider]    Physical Exam: Vitals:   12/09/20 1445 12/09/20 1500 12/09/20 1515 12/09/20 1530  BP:  97/65  95/62  Pulse: 85 85 84 86  Resp: (!) 22 (!) 21 (!) 24 (!) 21  Temp:      TempSrc:      SpO2: 97% 96% 98% 98%    Constitutional: NAD, calm, comfortable.  Poor historian.  Looks older than stated age. Vitals:   12/09/20 1445 12/09/20 1500 12/09/20 1515 12/09/20 1530  BP:  97/65  95/62  Pulse: 85 85 84 86  Resp: (!) 22 (!) 21 (!) 24 (!) 21  Temp:      TempSrc:      SpO2: 97% 96% 98% 98%   Eyes: PERRL, lids and conjunctivae normal ENMT: Mucous membranes are dry. Posterior pharynx clear of any exudate or lesions. Neck: normal, supple, no masses, no thyromegaly Respiratory: bilateral decreased breath sounds at bases, no wheezing; some scattered crackles.  Intermittently tachypneic.  No accessory muscle use.  Cardiovascular: S1 S2 positive, rate controlled. No extremity edema. 2+ pedal pulses.  Abdomen: no tenderness, no masses palpated. No hepatosplenomegaly. Bowel sounds positive.  Musculoskeletal: no clubbing / cyanosis. No joint deformity upper and lower extremities.  Skin: no rashes, lesions, ulcers. No induration Neurologic: CN 2-12 grossly intact. Moving extremities. No focal neurologic deficits.  Psychiatric:  Alert and oriented x 3.  Flat affect.  Labs on Admission: I have personally reviewed following labs and imaging studies  CBC: Recent Labs  Lab 12/09/20 1220  WBC 18.9*  NEUTROABS 14.5*  HGB 9.8*  HCT 31.5*  MCV 83.1  PLT [id number]*   Basic Metabolic Panel: Recent Labs  Lab 12/09/20 1220  NA 139  K 3.7  CL 101  CO2 23  GLUCOSE 125*  BUN 22  CREATININE 1.74*  CALCIUM 11.0*   GFR: Estimated  Creatinine Clearance: 37.1 mL/min (A) (by C-G formula based on SCr of 1.74 mg/dL (H)). Liver Function Tests: Recent Labs  Lab 12/09/20 1220  AST 27  ALT 35  ALKPHOS 97  BILITOT 0.3  PROT 9.2*  ALBUMIN 2.6*   No results for input(s): LIPASE, AMYLASE in the last 168 hours. No results for input(s): AMMONIA in the last 168 hours. Coagulation Profile: No results for input(s): INR, PROTIME in the last 168 hours. Cardiac Enzymes: No results for input(s): CKTOTAL, CKMB, CKMBINDEX, TROPONINI in the last 168 hours. BNP (last 3 results) No results for input(s): PROBNP in the last 8760 hours. HbA1C: No results for input(s): HGBA1C in the last 72 hours. CBG: No results for input(s): GLUCAP in the last 168 hours. Lipid Profile: No results for input(s): CHOL, HDL, LDLCALC, TRIG, CHOLHDL, LDLDIRECT in the last 72 hours. Thyroid Function Tests: No  results for input(s): TSH, T4TOTAL, FREET4, T3FREE, THYROIDAB in the last 72 hours. Anemia Panel: No results for input(s): VITAMINB12, FOLATE, FERRITIN, TIBC, IRON, RETICCTPCT in the last 72 hours. Urine analysis:    Component Value Date/Time   COLORURINE YELLOW 11/16/2020 1302   APPEARANCEUR HAZY (A) 11/16/2020 1302   APPEARANCEUR Clear 05/02/2017 1702   LABSPEC 1.011 11/16/2020 1302   PHURINE 7.0 11/16/2020 1302   GLUCOSEU NEGATIVE 11/16/2020 1302   HGBUR NEGATIVE 11/16/2020 1302   BILIRUBINUR NEGATIVE 11/16/2020 1302   BILIRUBINUR Negative 05/02/2017 1702   KETONESUR NEGATIVE 11/16/2020 1302   PROTEINUR 30 (A) 11/16/2020 1302   NITRITE NEGATIVE 11/16/2020 1302   LEUKOCYTESUR NEGATIVE 11/16/2020 1302    Radiological Exams on Admission: CT Head Wo Contrast  Result Date: 12/09/2020 CLINICAL DATA:  Subarachnoid hemorrhage [facility]) suspected; headache, severe, evaluate for mets, subarachnoid hemorrhage. Additional provided: Newly diagnosed renal cancer, headache. EXAM: CT HEAD WITHOUT CONTRAST TECHNIQUE: Contiguous axial images were obtained  from the base of the skull through the vertex without intravenous contrast. COMPARISON:  No pertinent prior exams available for comparison. FINDINGS: Brain: Mild cerebral atrophy. Mild ill-defined hypoattenuation within the cerebral white matter is nonspecific, but compatible with chronic small vessel ischemic disease. There is no acute intracranial hemorrhage. No demarcated cortical infarct. No extra-axial fluid collection. No evidence of intracranial mass. No midline shift. Vascular: No hyperdense vessel.  Atherosclerotic calcifications. Skull: Normal. Negative for fracture or focal lesion. Sinuses/Orbits: Visualized orbits show no acute finding. Mild ethmoid sinus mucosal thickening. Frothy secretions within the right sphenoid sinus. Other: Incidentally noted 1.3 x 0.3 cm right frontal scalp lipoma. IMPRESSION: No evidence of acute intracranial abnormality. There is limited assessment for intracranial metastatic disease on this noncontrast head CT. Within this limitation, no intracranial metastasis is identified. Mild cerebral atrophy and chronic small vessel ischemic disease. Right sphenoid sinusitis. Mild ethmoid sinus mucosal thickening. Incidentally noted 1.3 x 0.3 cm right frontal scalp lipoma. Electronically Signed   By: Jackey Loge DO   On: 12/09/2020 14:34   CT Chest W Contrast  Result Date: 12/09/2020 CLINICAL DATA:  Staging, metastatic renal cell carcinoma. EXAM: CT CHEST WITH CONTRAST TECHNIQUE: Multidetector CT imaging of the chest was performed during intravenous contrast administration. CONTRAST:  73mL OMNIPAQUE IOHEXOL 300 MG/ML  SOLN COMPARISON:  None. FINDINGS: Cardiovascular: Normal heart size. Trace pericardial fluid, image 99/2. Aortic atherosclerosis. Increase caliber of the main pulmonary artery measures 3.3 cm, image 60/2. 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 Mediastinum/Nodes: No enlarged mediastinal, hilar, or axillary lymph nodes. Thyroid gland, trachea, and esophagus  demonstrate no significant findings. Lungs/Pleura: Trace bilateral pleural fluid. No airspace consolidation, atelectasis or pneumothorax. Scattered lung nodules are identified: -index nodule within the subpleural aspect of the medial right upper lobe measures 1.4 cm, image 31/7. Anterolateral right upper lobe lung nodule measures 7 mm, image 46/7. Subpleural nodule within the posterolateral right upper lobe measures 0.9 cm, image 51/7. Right middle lobe lung nodule measures 5 mm, image 90/7. Subpleural nodule in the posterior right apex measures 1.3 x 0.7 cm, image 21/7. Upper Abdomen: Limited imaging through the upper abdomen again shows multifocal enhancing lesions consistent with metastatic disease. See report of MR abdomen from 11/21/2020. Musculoskeletal: No chest wall abnormality. No acute or significant osseous findings. IMPRESSION: 1. Multiple pulmonary nodules are identified within the right lung, suspicious for metastatic disease. 2. Trace bilateral pleural fluid. 3. Increase caliber of the main pulmonary artery measures 3.3 cm. 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 4. Trace pericardial fluid.  5. Limited imaging through the upper abdomen again shows multifocal enhancing lesions consistent with metastatic disease. See report of MR abdomen from 11/21/2020. 6. Aortic atherosclerosis. Aortic Atherosclerosis (ICD10-I70.0). Electronically Signed   By: Kerby Moors M.D.   On: 12/09/2020 14:35    EKG: Independently reviewed by myself.  Normal sinus rhythm.  No ST elevations or depressions.  Assessment/Plan  Nausea, vomiting and diarrhea -Concern for gastroenteritis,?  Viral.  Will rule out bacterial colitis as well.  Stool for C. difficile and GI PCR. -Antiemetics as needed. -IV fluids as below  Acute kidney injury Dehydration Dizziness -Presented with creatinine of 1.74; creatinine was 1.35 on 12/01/2020.  Patient received IV fluids in the ED.  will put him on normal saline at 125 cc an  hour.  Repeat a.m. creatinine -Patient complains of intermittent dizziness which could be due to dehydration and diarrhea but will check 2D echo and ultrasound carotid for completion of work-up.  PT eval in AM.  Leukocytosis -Possibly reactive.  CT of the chest does not show evidence of infiltrates.  Check UA.  Antibiotic plan as below.  Blood cultures.  COVID-19 test pending although patient has had 2 doses of COVID-19 vaccine.  Acute sinusitis -CT of the head shows right sphenoid sinusitis and mild ethmoid sinus mucosal thickening -Start Unasyn and switch to oral Augmentin on discharge  Recent diagnosis of stage IV clear-cell renal carcinoma with hepatic and pulmonary metastasis -Dr. Alen Blew planning to start chemotherapy soon.  will add him to care teams.  CT chest done today shows pulmonary metastases as well.  Hypercalcemia -Probably related to malignancy.  Repeat a.m. labs after hydration  Anemia of chronic disease -Probably from cancer.  Hemoglobin stable.  Monitor  Thrombocytosis -Probably reactive.  Monitor  Diabetes mellitus type 2 -CBGs with SSI.  Metformin on hold.  Hyperlipidemia -Continue statin  Hypertension -Blood pressure on lower side.  Hold antihypertensives   DVT prophylaxis: Lovenox code Status: Full Family Communication: Wife at bedside Disposition Plan: Home in 1 to 2 days if clinically improved Consults called: None.  Dr. Alen Blew sent the patient for evaluation and possible admission admission status: Observation/telemetry  Severity of Illness: The appropriate patient status for this patient is OBSERVATION. Observation status is judged to be reasonable and necessary in order to provide the required intensity of service to ensure the patient's safety. The patient's presenting symptoms, physical exam findings, and initial radiographic and laboratory data in the context of their medical condition is felt to place them at decreased risk for further clinical  deterioration. Furthermore, it is anticipated that the patient will be medically stable for discharge from the hospital within 2 midnights of admission. The following factors support the patient status of observation.   " The patient's presenting symptoms include nausea, vomiting, diarrhea. " The physical exam findings include dehydration, initial tachycardia. " The initial radiographic and laboratory data are leukocytosis, acute kidney injury, metastatic pulmonary disease.      Aline August MD [facility]  12/09/2020, 3:46 PM

## 2020-12-09 NOTE — ED Provider Notes (Signed)
[facility] DEPT Provider Note   CSN: [id number] Arrival date & time: 12/09/20  1138     History Chief Complaint  Patient presents with  . Headache    Chad Cantu is a 69 y.o. male.  I presented to the emergency room with concern for headache.  Patient states that he went to the cancer center for routine follow-up appointment regarding his kidney mass to discuss treatment plan.  While he was at the appointment, patient had a near syncopal episode, became diaphoretic, was ill-appearing.  They reported his blood pressure was very elevated, concern for possible withdrawal.  Sent to ER for further eval.  When I evaluated patient, the only acute complaint that he mentions is a headache.  His last drink was 12/5.  Denies any alcohol since.  HPI     Past Medical History:  Diagnosis Date  . Diabetes mellitus without complication ([place])   . ETOH abuse   . Hyperlipidemia   . Hypertension   . Substance abuse ([place])    ETOH, cocaine.  Remission    Patient Active Problem List   Diagnosis Date Noted  . Renal cell carcinoma ([place]) 12/09/2020  . Goals of care, counseling/discussion 12/09/2020  . AKI (acute kidney injury) ([place]) 12/09/2020  . Alcohol dependence with uncomplicated withdrawal ([place]) 11/16/2020  . Near syncope 09/20/2020  . Dehydration 09/20/2020  . Alcohol abuse 09/20/2020  . Alcohol use disorder, severe, dependence ([place])   . Memory difficulty 05/02/2017  . Rash, skin 05/02/2017  . Overactive bladder 05/02/2017  . Diabetes mellitus type 2 in nonobese ([place]) 05/01/2017  . Alcohol use disorder 05/01/2017  . HLD (hyperlipidemia) 04/09/2015  . Essential hypertension 04/08/2015  . Smoking 04/08/2015    Past Surgical History:  Procedure Laterality Date  . COLONOSCOPY  5 years ago?   at [facility] maybe per pt,?unsure results  . LAPAROSCOPIC GASTROTOMY W/ REPAIR OF ULCER         Family History  Problem Relation Age of Onset  . Diabetes Mother    . Colon cancer Neg Hx   . Colon polyps Neg Hx   . Esophageal cancer Neg Hx   . Rectal cancer Neg Hx   . Stomach cancer Neg Hx     Social History   Tobacco Use  . Smoking status: Current Every Day Smoker    Packs/day: 2.00    Years: 55.00    Pack years: 110.00    Types: Cigarettes  . Smokeless tobacco: Never Used  Vaping Use  . Vaping Use: Never used  Substance Use Topics  . Alcohol use: Yes    Alcohol/week: 12.0 standard drinks    Types: 12 Cans of beer per week  . Drug use: Yes    Types: Marijuana    Comment: marijuana maybe 2 months ago, remote h/o cocaine use    Home Medications Prior to Admission medications   Medication Sig Start Date End Date Taking? Authorizing Provider  acetaminophen (TYLENOL) 325 MG tablet Take 2 tablets (650 mg total) by mouth every 6 (six) hours as needed for mild pain (or Fever >/= 101). 11/22/20  Yes Domenic Polite, MD  Ascorbic Acid (VITAMIN C PO) Take by mouth.   Yes [provider]  atorvastatin (LIPITOR) 20 MG tablet Take 1 tablet (20 mg total) by mouth daily. 06/05/20  Yes [name], MD  metFORMIN (GLUCOPHAGE) 500 MG tablet Take 1 tablet (500 mg total) by mouth daily with breakfast. 06/05/20  Yes [name], MD  prochlorperazine (COMPAZINE) 10 MG tablet Take 1 tablet (10 mg total) by mouth every 6 (six) hours as needed for nausea or vomiting. 12/09/20   Benjiman Core, MD  lisinopril (ZESTRIL) 20 MG tablet Take 20 mg by mouth daily. 03/26/20 09/20/20  [provider]    Allergies    Patient has no known allergies.  Review of Systems   Review of Systems  Constitutional: Negative for chills and fever.  HENT: Negative for ear pain and sore throat.   Eyes: Negative for pain and visual disturbance.  Respiratory: Positive for shortness of breath. Negative for cough.   Cardiovascular: Negative for chest pain and palpitations.  Gastrointestinal: Negative for abdominal pain and vomiting.  Genitourinary:  Negative for dysuria and hematuria.  Musculoskeletal: Negative for arthralgias and back pain.  Skin: Negative for color change and rash.  Neurological: Positive for headaches. Negative for seizures and syncope.  All other systems reviewed and are negative.   Physical Exam Updated Vital Signs BP 96/63   Pulse 81   Temp 98.9 F (37.2 C) (Oral)   Resp 15   SpO2 98%   Physical Exam Vitals and nursing note reviewed.  Constitutional:      Appearance: He is well-developed and well-nourished.  HENT:     Head: Normocephalic and atraumatic.  Eyes:     Conjunctiva/sclera: Conjunctivae normal.  Cardiovascular:     Rate and Rhythm: Normal rate and regular rhythm.     Heart sounds: No murmur heard.   Pulmonary:     Effort: Pulmonary effort is normal. No respiratory distress.     Breath sounds: Normal breath sounds.  Abdominal:     Palpations: Abdomen is soft.     Tenderness: There is no abdominal tenderness.  Musculoskeletal:        General: No edema.     Cervical back: Neck supple.  Skin:    General: Skin is warm and dry.  Neurological:     Mental Status: He is alert and oriented to person, place, and time.  Psychiatric:        Mood and Affect: Mood and affect and mood normal.        Behavior: Behavior normal.     ED Results / Procedures / Treatments   Labs (all labs ordered are listed, but only abnormal results are displayed) Labs Reviewed  CBC WITH DIFFERENTIAL/PLATELET - Abnormal; Notable for the following components:      Result Value   WBC 18.9 (*)    RBC 3.79 (*)    Hemoglobin 9.8 (*)    HCT 31.5 (*)    MCH 25.9 (*)    RDW 15.8 (*)    Platelets 539 (*)    Neutro Abs 14.5 (*)    Monocytes Absolute 2.0 (*)    Abs Immature Granulocytes 0.13 (*)    All other components within normal limits  BASIC METABOLIC PANEL - Abnormal; Notable for the following components:   Glucose, Bld 125 (*)    Creatinine, Ser 1.74 (*)    Calcium 11.0 (*)    GFR, Estimated 42 (*)     All other components within normal limits  HEPATIC FUNCTION PANEL - Abnormal; Notable for the following components:   Total Protein 9.2 (*)    Albumin 2.6 (*)    All other components within normal limits  SARS CORONAVIRUS 2 (TAT 6-24 HRS)  ETHANOL  RAPID URINE DRUG SCREEN, HOSP PERFORMED  TROPONIN I (HIGH SENSITIVITY)  TROPONIN I (HIGH SENSITIVITY)  EKG EKG Interpretation  Date/Time:  Tuesday December 09 2020 13:12:24 EST Ventricular Rate:  92 PR Interval:    QRS Duration: 87 QT Interval:  348 QTC Calculation: 431 R Axis:   28 Text Interpretation: Sinus rhythm Abnormal R-wave progression, early transition Confirmed by Marianna Fuss ([id number]) on 12/09/2020 1:51:29 PM   Radiology CT Head Wo Contrast  Result Date: 12/09/2020 CLINICAL DATA:  Subarachnoid hemorrhage [facility]) suspected; headache, severe, evaluate for mets, subarachnoid hemorrhage. Additional provided: Newly diagnosed renal cancer, headache. EXAM: CT HEAD WITHOUT CONTRAST TECHNIQUE: Contiguous axial images were obtained from the base of the skull through the vertex without intravenous contrast. COMPARISON:  No pertinent prior exams available for comparison. FINDINGS: Brain: Mild cerebral atrophy. Mild ill-defined hypoattenuation within the cerebral white matter is nonspecific, but compatible with chronic small vessel ischemic disease. There is no acute intracranial hemorrhage. No demarcated cortical infarct. No extra-axial fluid collection. No evidence of intracranial mass. No midline shift. Vascular: No hyperdense vessel.  Atherosclerotic calcifications. Skull: Normal. Negative for fracture or focal lesion. Sinuses/Orbits: Visualized orbits show no acute finding. Mild ethmoid sinus mucosal thickening. Frothy secretions within the right sphenoid sinus. Other: Incidentally noted 1.3 x 0.3 cm right frontal scalp lipoma. IMPRESSION: No evidence of acute intracranial abnormality. There is limited assessment for intracranial  metastatic disease on this noncontrast head CT. Within this limitation, no intracranial metastasis is identified. Mild cerebral atrophy and chronic small vessel ischemic disease. Right sphenoid sinusitis. Mild ethmoid sinus mucosal thickening. Incidentally noted 1.3 x 0.3 cm right frontal scalp lipoma. Electronically Signed   By: Jackey Loge DO   On: 12/09/2020 14:34   CT Chest W Contrast  Result Date: 12/09/2020 CLINICAL DATA:  Staging, metastatic renal cell carcinoma. EXAM: CT CHEST WITH CONTRAST TECHNIQUE: Multidetector CT imaging of the chest was performed during intravenous contrast administration. CONTRAST:  51mL OMNIPAQUE IOHEXOL 300 MG/ML  SOLN COMPARISON:  None. FINDINGS: Cardiovascular: Normal heart size. Trace pericardial fluid, image 99/2. Aortic atherosclerosis. Increase caliber of the main pulmonary artery measures 3.3 cm, image 60/2. 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 Mediastinum/Nodes: No enlarged mediastinal, hilar, or axillary lymph nodes. Thyroid gland, trachea, and esophagus demonstrate no significant findings. Lungs/Pleura: Trace bilateral pleural fluid. No airspace consolidation, atelectasis or pneumothorax. Scattered lung nodules are identified: -index nodule within the subpleural aspect of the medial right upper lobe measures 1.4 cm, image 31/7. Anterolateral right upper lobe lung nodule measures 7 mm, image 46/7. Subpleural nodule within the posterolateral right upper lobe measures 0.9 cm, image 51/7. Right middle lobe lung nodule measures 5 mm, image 90/7. Subpleural nodule in the posterior right apex measures 1.3 x 0.7 cm, image 21/7. Upper Abdomen: Limited imaging through the upper abdomen again shows multifocal enhancing lesions consistent with metastatic disease. See report of MR abdomen from 11/21/2020. Musculoskeletal: No chest wall abnormality. No acute or significant osseous findings. IMPRESSION: 1. Multiple pulmonary nodules are identified within the right lung,  suspicious for metastatic disease. 2. Trace bilateral pleural fluid. 3. Increase caliber of the main pulmonary artery measures 3.3 cm. 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 4. Trace pericardial fluid. 5. Limited imaging through the upper abdomen again shows multifocal enhancing lesions consistent with metastatic disease. See report of MR abdomen from 11/21/2020. 6. Aortic atherosclerosis. Aortic Atherosclerosis (ICD10-I70.0). Electronically Signed   By: Signa Kell M.D.   On: 12/09/2020 14:35    Procedures Procedures (including critical care time)  Medications Ordered in ED Medications  sodium chloride 0.9 % bolus 1,000 mL (0 mLs  Intravenous Stopped 12/09/20 1420)  acetaminophen (TYLENOL) tablet 1,000 mg (1,000 mg Oral Given 12/09/20 1308)  0.9 %  sodium chloride infusion ( Intravenous New Bag/Given 12/09/20 1433)  iohexol (OMNIPAQUE) 300 MG/ML solution 75 mL (60 mLs Intravenous Contrast Given 12/09/20 1358)    ED Course  I have reviewed the triage vital signs and the nursing notes.  Pertinent labs & imaging results that were available during my care of the patient were reviewed by me and considered in my medical decision making (see chart for details).  Clinical Course as of 12/09/20 1530  Tue Dec 09, 2020  1350 D/w Alen Blew, no further recs, agrees with admission for obs, rehydration [RD]  1424 Reassessed, awaiting Cts, no headache anymore [RD]    Clinical Course User Index [RD] Lucrezia Starch, MD   MDM Rules/Calculators/A&P                         69 year old male presenting to the emergency room with headache, in clinic with oncologist patient noted to be tremulous, diaphoretic, near syncopal episode with shaking, concern for possible withdrawal.  In ER, patient was actually remarkably well-appearing.  Initially soft blood pressure.  Does not demonstrate any signs or symptoms at present consistent with withdrawal.  His blood pressure improved after receiving IV fluids.   His blood work was notable for acute kidney injury and leukocytosis but was otherwise grossly normal.  He does not have a fever, no infectious symptoms. Will defer abx for now.  CT head negative.  Discussed case with Dr. Alen Blew his oncologist.  CT chest ordered for staging per oncology request.  Will admit to hospitalist service for further rehydration, observation.  Final Clinical Impression(s) / ED Diagnoses Final diagnoses:  AKI (acute kidney injury) ([place])  Dehydration    Rx / DC Orders ED Discharge Orders    None       Lucrezia Starch, MD 12/09/20 [phone]

## 2020-12-09 NOTE — Progress Notes (Signed)
Pharmacy Antibiotic Note  Chad Cantu is a 69 y.o. male presented to ED from [facility] on 12/09/2020 with headache, N/V/D, possible EtOH withdrawal.  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  Pharmacy has been consulted for Unasyn dosing for sinusitis.  Plan: Unasyn 3g IV q6h Follow up renal function, culture results if available, and clinical course.  Height: 5\' 6"  (167.6 cm) Weight: 63.4 kg (139 lb 12.4 oz) IBW/kg (Calculated) : 63.8  Temp (24hrs), Avg:98.6 F (37 C), Min:97.9 F (36.6 C), Max:99 F (37.2 C)  Recent Labs  Lab 12/09/20 1220  WBC 18.9*  CREATININE 1.74*    Estimated Creatinine Clearance: 36.9 mL/min (A) (by C-G formula based on SCr of 1.74 mg/dL (H)).    No Known Allergies  Antimicrobials this admission: 12/28 Unasyn >>  Dose adjustments this admission:   Microbiology results: 12/28 Respiratory panel: 12/28 BCx: 12/28 GI panel: 12/28 CDiff:   Thank you for allowing pharmacy to be a part of this patient's care.  1/29 PharmD, BCPS Clinical Pharmacist [facility] main pharmacy [phone] 12/09/2020 4:58 PM

## 2020-12-09 NOTE — Plan of Care (Signed)
  Problem: Education: Goal: Knowledge of General Education information will improve Description Including pain rating scale, medication(s)/side effects and non-pharmacologic comfort measures Outcome: Progressing   

## 2020-12-09 NOTE — Progress Notes (Signed)
Reason for the request:    Kidney cancer  HPI: I was asked by Dr. Wynetta Emery to evaluate Chad Cantu for evaluation of kidney cancer.  He is a 69 year old with history of diabetes and polysubstance abuse was hospitalized between December 5 and December 11 after presenting with abdominal pain.  He had was also found to have alcohol withdrawal as well as ongoing weight loss.  His evaluation included CT scan of the abdomen and pelvis on 11/19/2020 which showed a 5.7 x 5.6 x 5.4 mass in the left kidney.  He also had abnormal liver lesions.  MRI of the abdomen on November 21, 2020 confirmed the presence of a 5.8 x 5.5 x 4.9 cm mass associated with the lower pole of the left kidney.  He also had a 2.1 cm mass suspicious for metastatic disease in the liver.  He also had a 56mm and a 13 mm lesion as well.  Other smaller lesions were also noted in both lobes of the liver.  Ultrasound biopsy of the hepatic lesion completed on December 01, 2020 which showed metastatic carcinoma consistent with clear-cell renal cell carcinoma.  Since his discharge, he has been doing okay until the last 48 hours.  He is not experiencing symptoms of nausea, vomiting as well as diarrhea and more tremors.  He also also periodic episodes of altered mental status although no clear seizures.  He has been eating poorly at this time.  He denies alcohol intake since his discharge from his recent hospitalization.  He does not report any headaches, blurry vision.  Does not report any fevers, chills or sweats.  Does not report any cough, wheezing or hemoptysis.  Does not report any chest pain, palpitation, orthopnea or leg edema.  Does not report any nausea, vomiting or abdominal pain.  Does not report any constipation or diarrhea.  Does not report any skeletal complaints.    Does not report frequency, urgency or hematuria.  Does not report any skin rashes or lesions. Does not report any heat or cold intolerance.  Does not report any lymphadenopathy or  petechiae.  Does not report any anxiety or depression.  Remaining review of systems is negative.    Past Medical History:  Diagnosis Date  . Diabetes mellitus without complication ([place])   . ETOH abuse   . Hyperlipidemia   . Hypertension   . Substance abuse ([place])    ETOH, cocaine.  Remission  :  Past Surgical History:  Procedure Laterality Date  . COLONOSCOPY  5 years ago?   at [facility] maybe per pt,?unsure results  . LAPAROSCOPIC GASTROTOMY W/ REPAIR OF ULCER    :   Current Outpatient Medications:  .  acetaminophen (TYLENOL) 325 MG tablet, Take 2 tablets (650 mg total) by mouth every 6 (six) hours as needed for mild pain (or Fever >/= 101)., Disp: , Rfl:  .  Ascorbic Acid (VITAMIN C PO), Take by mouth., Disp: , Rfl:  .  atorvastatin (LIPITOR) 20 MG tablet, Take 1 tablet (20 mg total) by mouth daily., Disp: 90 tablet, Rfl: 3 .  metFORMIN (GLUCOPHAGE) 500 MG tablet, Take 1 tablet (500 mg total) by mouth daily with breakfast., Disp: 90 tablet, Rfl: 2:  No Known Allergies:  Family History  Problem Relation Age of Onset  . Diabetes Mother   . Colon cancer Neg Hx   . Colon polyps Neg Hx   . Esophageal cancer Neg Hx   . Rectal cancer Neg Hx   . Stomach cancer Neg Hx   :  Social History   Socioeconomic History  . Marital status: Married    Spouse name: Not on file  . Number of children: Not on file  . Years of education: Not on file  . Highest education level: Not on file  Occupational History  . Occupation: security guard  Tobacco Use  . Smoking status: Current Every Day Smoker    Packs/day: 2.00    Years: 55.00    Pack years: 110.00    Types: Cigarettes  . Smokeless tobacco: Never Used  Vaping Use  . Vaping Use: Never used  Substance and Sexual Activity  . Alcohol use: Yes    Alcohol/week: 12.0 standard drinks    Types: 12 Cans of beer per week  . Drug use: Yes    Types: Marijuana    Comment: marijuana maybe 2 months ago, remote h/o cocaine use  . Sexual  activity: Yes  Other Topics Concern  . Not on file  Social History Narrative  . Not on file   Social Determinants of Health   Financial Resource Strain: Not on file  Food Insecurity: Not on file  Transportation Needs: Not on file  Physical Activity: Not on file  Stress: Not on file  Social Connections: Not on file  Intimate Partner Violence: Not on file  :  Pertinent items are noted in HPI.  Exam: Blood pressure (!) 130/109, pulse (!) 186, temperature 97.9 F (36.6 C), temperature source Tympanic, resp. rate 20, height 5\' 6"  (1.676 m), weight 140 lb 3.2 oz (63.6 kg).   ECOG 1 General appearance: Well-appearing gentleman with tremors. Head: atraumatic without any abnormalities. Eyes: conjunctivae/corneas clear. PERRL.  Sclera anicteric. Throat: lips, mucosa, and tongue normal; without oral thrush or ulcers. Resp: clear to auscultation bilaterally without rhonchi, wheezes or dullness to percussion. Cardio: Tachycardic without any murmurs. GI: soft, non-tender; bowel sounds normal; no masses,  no organomegaly Skin: Skin color, texture, turgor normal. No rashes or lesions Lymph nodes: Cervical, supraclavicular, and axillary nodes normal. Neurologic: Grossly normal without any motor, sensory or deep tendon reflexes. Musculoskeletal: No joint deformity or effusion.    MR ABDOMEN W WO CONTRAST  Result Date: 11/21/2020 CLINICAL DATA:  Evaluate left renal lesions seen CT scan. EXAM: MRI ABDOMEN WITHOUT AND WITH CONTRAST TECHNIQUE: Multiplanar multisequence MR imaging of the abdomen was performed both before and after the administration of intravenous contrast. CONTRAST:  7.72mL GADAVIST GADOBUTROL 1 MMOL/ML IV SOLN COMPARISON:  CT scan 11/19/2020 FINDINGS: Lower chest: The lung bases are grossly clear. No pulmonary lesions or pleural or pericardial effusion. Hepatobiliary: Hepatic metastatic lesions are identified. 2.1 cm segment 7 lesion on image 29/15. 16 mm segment 6 lesion on  image 47/15. 13 mm segment 6 lesion image 59/17. Other smaller scattered lesions in both lobes. The gallbladder is unremarkable. No intra or extrahepatic biliary dilatation. Pancreas:  No mass, inflammation or ductal dilatation. Spleen:  Normal size. No focal lesions. Adrenals/Urinary Tract: Small right adrenal gland nodule has MR imaging features of a benign adenoma. As demonstrated on the CT scan there is a large heterogeneously enhancing mass associated with the lower pole region of left kidney. This measures 5.8 x 5.5 x 4.9 cm. Associated perinephric hematoma as demonstrated on the CT scan measuring approximately 6 x 5 cm. The left renal vein is patent. No retroperitoneal lymphadenopathy is identified. Stomach/Bowel: The stomach, duodenum, visualized small bowel and visualize colon are grossly normal. Vascular/Lymphatic: The aorta and branch vessels are normal. The major venous structures are patent. No mesenteric  or retroperitoneal lymphadenopathy. Other:  No ascites or abdominal wall hernia. Musculoskeletal: No significant bony findings. No findings suspicious for osseous metastatic disease. IMPRESSION: 1. 5.8 x 5.5 x 4.9 cm heterogeneously enhancing mass associated with the lower pole region of the left kidney. This is consistent with renal cell neoplasm. No evidence of renal vein involvement or retroperitoneal lymphadenopathy. 2. Hepatic metastatic lesions. 3. Small right adrenal gland nodule consistent with a benign adenoma. Electronically Signed   By: Marijo Sanes M.D.   On: 11/21/2020 06:02   CT ABDOMEN PELVIS W CONTRAST  Result Date: 11/19/2020 CLINICAL DATA:  Chronic abdominal pain and weight loss. EXAM: CT ABDOMEN AND PELVIS WITH CONTRAST TECHNIQUE: Multidetector CT imaging of the abdomen and pelvis was performed using the standard protocol following bolus administration of intravenous contrast. CONTRAST:  133mL OMNIPAQUE IOHEXOL 300 MG/ML  SOLN COMPARISON:  None. FINDINGS: Lower chest: A trace  amount of atelectasis is seen within the posterior aspect of the bilateral lung bases. Very small bilateral pleural effusions are noted. Hepatobiliary: A 1.3 cm x 0.9 cm focus of ill-defined low attenuation, with faint surrounding hyperdense rim, is seen within the posterior aspect of the right lobe of the liver. No gallstones, gallbladder wall thickening, or biliary dilatation. Pancreas: Unremarkable. No pancreatic ductal dilatation or surrounding inflammatory changes. Spleen: Normal in size without focal abnormality. Adrenals/Urinary Tract: A 1.2 cm x 0.9 cm low-attenuation right adrenal mass is seen. The left adrenal gland is normal in appearance. Kidneys are normal in size, without obstructing renal calculi or hydronephrosis. A 2 mm nonobstructing renal stone is seen within the mid left kidney. A 5.7 cm x 5.6 cm x 5.4 cm area of heterogeneous low attenuation is seen within the lower pole of the left kidney. A moderate amount of associated perinephric inflammatory fat stranding is seen. An adjacent 5.5 cm x 4.7 cm x 3.6 cm area of mildly increased attenuation is seen adjacent to the lower pole of the left kidney. The urinary bladder is unremarkable. Stomach/Bowel: Stomach is within normal limits. Appendix appears normal. Surgically anastomosed bowel is seen within the anterior aspect of the mid abdomen. No evidence of bowel wall thickening, distention, or inflammatory changes. Vascular/Lymphatic: Aortic atherosclerosis. No enlarged abdominal or pelvic lymph nodes. Reproductive: The prostate gland is moderately enlarged. Other: No abdominal wall hernia or abnormality. No abdominopelvic ascites. Musculoskeletal: There is approximately 3 mm retrolisthesis of the L5 vertebral body on S1. IMPRESSION: 1. Findings consistent with a large mass within the lower pole of the left kidney, with an adjacent subacute hematoma. While no evidence of active bleeding is identified, MRI correlation is recommended to further confirm  the presence of an underlying neoplasm. 2. 2 mm nonobstructing renal stone within the left kidney. 3. Low-attenuation right adrenal mass likely consistent with an adrenal adenoma. 4. Very small bilateral pleural effusions. 5. Findings which may represent a hepatic hemangioma versus solitary metastatic lesion. MRI correlation is again recommended. Aortic Atherosclerosis (ICD10-I70.0). Electronically Signed   By: Virgina Norfolk M.D.   On: 11/19/2020 16:13   US BIOPSY (LIVER)  Result Date: 12/01/2020 INDICATION: Concern for metastatic renal cell carcinoma. Please from ultrasound-guided liver lesion for tissue diagnostic purposes. EXAM: ULTRASOUND GUIDED LIVER LESION BIOPSY COMPARISON:  Abdominal MRI-11/21/2020; CT abdomen pelvis-11/19/2020 MEDICATIONS: None ANESTHESIA/SEDATION: Fentanyl 100 mcg IV; Versed 2 mg IV Total Moderate Sedation time:  12 Minutes. The patient's level of consciousness and vital signs were monitored continuously by radiology nursing throughout the procedure under my direct supervision. COMPLICATIONS: None immediate.  PROCEDURE: Informed written consent was obtained from the patient after a discussion of the risks, benefits and alternatives to treatment. The patient understands and consents the procedure. A timeout was performed prior to the initiation of the procedure. Ultrasound scanning was performed of the right upper abdominal quadrant demonstrates an approximately 2.1 x 1.8 cm hypoechoic nodule within the inferior aspect of the right lobe of the liver adjacent to the gallbladder fossa (image 22), correlating with the ill-defined lesion seen on preceding abdominal MRI image 47, series 15. This lesion was targeted for biopsy given lesion location and sonographic window. The right upper abdominal quadrant was prepped and draped in the usual sterile fashion. The overlying soft tissues were anesthetized with 1% lidocaine with epinephrine. A 17 gauge, 6.8 cm co-axial needle was advanced  into a peripheral aspect of the lesion. This was followed by 5 core biopsies with an 18 gauge core device under direct ultrasound guidance. The coaxial needle tract was embolized with a small amount of Gel-Foam slurry and superficial hemostasis was obtained with manual compression. Post procedural scanning was negative for definitive area of hemorrhage or additional complication. A dressing was placed. The patient tolerated the procedure well without immediate post procedural complication. IMPRESSION: Technically successful ultrasound guided core needle biopsy of indeterminate lesion within the inferior aspect of the right lobe of the liver adjacent to the gallbladder fossa. Electronically Signed   By: Sandi Mariscal M.D.   On: 12/01/2020 14:17   DG CHEST PORT 1 VIEW  Result Date: 11/18/2020 CLINICAL DATA:  Fever. EXAM: PORTABLE CHEST 1 VIEW COMPARISON:  May 04, 2016 FINDINGS: The heart size and mediastinal contours are within normal limits. There is moderate severity calcification of the aortic arch. Both lungs are clear. The visualized skeletal structures are unremarkable. IMPRESSION: No active disease. Electronically Signed   By: Virgina Norfolk M.D.   On: 11/18/2020 17:14    Assessment and Plan:   69 year old with:  1.  Stage IV clear-cell renal cell carcinoma presented with a left kidney mass measuring 5.8 x 5.5 x 4.9 cm associated with multiple hepatic lesions that are biopsy-proven to be renal cell carcinoma.  The natural course of this disease was reviewed and treatment options were reiterated.  Radical nephrectomy would be a consideration as an upfront therapy but it is deferred at this time given his metastatic hepatic lesions that could get worse in the interim.  Upfront systemic therapy remains a better option at this time.  The role for systemic therapy for stage IV renal cell carcinoma with intermediate risk were discussed.  Options that include oral targeted therapy, immunotherapy  combination of the above as well as combination immunotherapy utilizing ipilimumab and nivolumab.  Risks and benefits of all these approaches were discussed at this time.  Complication associated with oral targeted therapy such as cabozantinib and include diarrhea, hypertension among others were reviewed.  Combination immunotherapy complications that include pneumonitis, hepatitis, thyroid disease among others.   After discussion today, we opted to proceed with staging CT scan of the chest and subsequently starting therapy with ipilimumab and nivolumab.  2.  Immune mediated complications: We will continue to monitor those closely including pneumonitis, colitis, hypophysitis among others.   3.  IV access: Peripheral veins will be used for the time being.  Port-A-Cath will be deferred for the future.   4.  Antiemetics: Prescription for Compazine will be available to him.  5.  Goals of care and prognosis: His disease is incurable although aggressive measures are  warranted given his reasonable performance status.  6.  Polysubstance abuse: Abstinence is critical at this time moving forward given his recent diagnosis and complications associated with alcohol intake with this therapy.  He appears to be experiencing symptoms of withdrawal potentially versus possible sepsis.  We will send him for emergency department for an evaluation regarding this issue prior to start of treatment.  7.  Follow-up: Will be in the near future to start therapy.  60  minutes were dedicated to this visit. The time was spent on reviewing laboratory data, imaging studies, discussing treatment options, discussing differential diagnosis and answering questions regarding future plan.     A copy of this consult has been forwarded to the requesting physician.

## 2020-12-09 NOTE — Telephone Encounter (Signed)
1130-After patient's MD appointment today patient taken to the ED at [facility] via wheelchair. Per Dr. Clelia Croft patient to be taken to the ED for evaluation. Called and spoke to [facility], RN- charge nurse in the ED to make her aware that patient needs to come to the ED for evaluation. Per Kennyth Arnold, there are currently no beds available and patient will have to check in at registration and wait until a bed is available. Patient taken to ED registration and per receptionist she will let the charge nurse know once the patient has been registered. Patient's wife is with the patient as well.

## 2020-12-09 NOTE — ED Triage Notes (Addendum)
Pt presents from the cancer center with c/o headache. Pt is a newly diagnosed kidney cancer pt, had a biopsy done recently and was returning today for the results. Pt was told at the cancer center that his HR was 180, HR in the 90's in triage. Pt is initially very hypertensive in triage but once blood pressure cuff was adjusted pt is actually hypotensive at 77/40 and is c/o headache 10/10.

## 2020-12-10 ENCOUNTER — Observation Stay (HOSPITAL_COMMUNITY): Payer: Medicare Other

## 2020-12-10 ENCOUNTER — Observation Stay (HOSPITAL_BASED_OUTPATIENT_CLINIC_OR_DEPARTMENT_OTHER): Payer: Medicare Other

## 2020-12-10 ENCOUNTER — Other Ambulatory Visit (HOSPITAL_COMMUNITY): Payer: Self-pay | Admitting: Internal Medicine

## 2020-12-10 ENCOUNTER — Telehealth: Payer: Self-pay | Admitting: Oncology

## 2020-12-10 DIAGNOSIS — N179 Acute kidney failure, unspecified: Secondary | ICD-10-CM | POA: Diagnosis not present

## 2020-12-10 DIAGNOSIS — R55 Syncope and collapse: Secondary | ICD-10-CM | POA: Diagnosis not present

## 2020-12-10 DIAGNOSIS — E7849 Other hyperlipidemia: Secondary | ICD-10-CM | POA: Diagnosis not present

## 2020-12-10 DIAGNOSIS — E860 Dehydration: Secondary | ICD-10-CM | POA: Diagnosis not present

## 2020-12-10 DIAGNOSIS — E119 Type 2 diabetes mellitus without complications: Secondary | ICD-10-CM | POA: Diagnosis not present

## 2020-12-10 LAB — GLUCOSE, CAPILLARY
Glucose-Capillary: 106 mg/dL — ABNORMAL HIGH (ref 70–99)
Glucose-Capillary: 101 mg/dL — ABNORMAL HIGH (ref 70–99)

## 2020-12-10 LAB — CBC
HCT: 26.9 % — ABNORMAL LOW (ref 39.0–52.0)
Hemoglobin: 8.5 g/dL — ABNORMAL LOW (ref 13.0–17.0)
MCH: 25.6 pg — ABNORMAL LOW (ref 26.0–34.0)
MCHC: 31.6 g/dL (ref 30.0–36.0)
MCV: 81 fL (ref 80.0–100.0)
Platelets: 432 10*3/uL — ABNORMAL HIGH (ref 150–400)
RBC: 3.32 MIL/uL — ABNORMAL LOW (ref 4.22–5.81)
RDW: 15.7 % — ABNORMAL HIGH (ref 11.5–15.5)
WBC: 12.3 10*3/uL — ABNORMAL HIGH (ref 4.0–10.5)
nRBC: 0 % (ref 0.0–0.2)

## 2020-12-10 LAB — COMPREHENSIVE METABOLIC PANEL
ALT: 28 U/L (ref 0–44)
AST: 20 U/L (ref 15–41)
Albumin: 2.1 g/dL — ABNORMAL LOW (ref 3.5–5.0)
Alkaline Phosphatase: 77 U/L (ref 38–126)
Anion gap: 10 (ref 5–15)
BUN: 12 mg/dL (ref 8–23)
CO2: 25 mmol/L (ref 22–32)
Calcium: 9.6 mg/dL (ref 8.9–10.3)
Chloride: 106 mmol/L (ref 98–111)
Creatinine, Ser: 0.79 mg/dL (ref 0.61–1.24)
GFR, Estimated: >60 mL/min (ref >60)
Glucose, Bld: 107 mg/dL — ABNORMAL HIGH (ref 70–99)
Potassium: 3.3 mmol/L — ABNORMAL LOW (ref 3.5–5.1)
Sodium: 141 mmol/L (ref 135–145)
Total Bilirubin: 0.3 mg/dL (ref 0.3–1.2)
Total Protein: 7.5 g/dL (ref 6.5–8.1)

## 2020-12-10 LAB — ECHOCARDIOGRAM COMPLETE
Area-P 1/2: 3.48 cm2
Height: 66 in
S' Lateral: 2.3 cm
Weight: 2264.57 oz

## 2020-12-10 LAB — C DIFFICILE QUICK SCREEN W PCR REFLEX
C Diff antigen: NEGATIVE
C Diff interpretation: NOT DETECTED
C Diff toxin: NEGATIVE

## 2020-12-10 LAB — MAGNESIUM: Magnesium: 1.5 mg/dL — ABNORMAL LOW (ref 1.7–2.4)

## 2020-12-10 MED ORDER — POTASSIUM CHLORIDE CRYS ER 20 MEQ PO TBCR
40.0000 meq | EXTENDED_RELEASE_TABLET | Freq: Once | ORAL | Status: AC
  Administered 2020-12-10: 08:00:00 40 meq via ORAL
  Filled 2020-12-10: qty 2

## 2020-12-10 MED ORDER — POTASSIUM CHLORIDE 10 MEQ/100ML IV SOLN
10.0000 meq | INTRAVENOUS | Status: AC
Start: 2020-12-10 — End: 2020-12-10
  Administered 2020-12-10 (×4): 10 meq via INTRAVENOUS
  Filled 2020-12-10 (×4): qty 100

## 2020-12-10 MED ORDER — ONDANSETRON HCL 4 MG PO TABS: 4.0000 mg | ORAL_TABLET | Freq: Every day | ORAL | 0 refills | Status: DC | PRN

## 2020-12-10 MED ORDER — MAGNESIUM SULFATE 2 GM/50ML IV SOLN
2.0000 g | Freq: Once | INTRAVENOUS | Status: AC
  Administered 2020-12-10: 08:00:00 2 g via INTRAVENOUS
  Filled 2020-12-10: qty 50

## 2020-12-10 MED FILL — ONDANSETRON HCL 4 MG TABLET: 4 | 30 days supply | Qty: 30 | Fill #0

## 2020-12-10 NOTE — Progress Notes (Signed)
Echocardiogram 2D Echocardiogram has been performed.  Chad Cantu 12/10/2020, 9:10 AM

## 2020-12-10 NOTE — Progress Notes (Signed)
PHARMACY NOTE -  ANTIBIOTIC RENAL DOSE ADJUSTMENT   Request received for Pharmacy to assist with antibiotic renal dose adjustment.  Patient has been initiated on Unasyn 3gm IV q6h for sinusitis. SCr 0.79, estimated CrCl >80 ml/min Current dosage is appropriate and need for further dosage adjustment appears unlikely at present.   Will sign off at this time.  Please reconsult if a change in clinical status warrants re-evaluation of dosage. Will monitor peripherally via electronic surveillance software.   Junita Push, PharmD, BCPS 12/10/2020@8 :52 AM

## 2020-12-10 NOTE — Progress Notes (Signed)
PT Cancellation Note  Patient Details Name: Chad Cantu MRN: [id number] DOB: 02-May-1951   Cancelled Treatment:    Reason Eval/Treat Not Completed: Patient at procedure or test/unavailable. Attempted PT eval-pt unavailable at this time. Will check back later today.    Faye Ramsay, PT Acute Rehabilitation  Office: [phone] Pager: [phone]

## 2020-12-10 NOTE — Telephone Encounter (Signed)
Scheduled per 12/28 los, called and spoke with patient's relative or spouse regarding upcoming appointments. Patient will be notified of upcoming appointments.

## 2020-12-10 NOTE — Progress Notes (Signed)
Carotid US completed    Please see CV Proc for preliminary results.   Gloria Ricardo, RVT  

## 2020-12-10 NOTE — Evaluation (Signed)
Physical Therapy Evaluation-1x Patient Details Name: Chad Cantu MRN: [id number] DOB: Jan 14, 1951 Today's Date: 12/10/2020   History of Present Illness  69 yo male admitted with AKI, N/V/D, near syncopal event at cancer center. Hx of polysubstance abuse, kidney ca, syncope, DM, memory deficits  Clinical Impression  On eval, pt was Mod Ind with mobility. He walked ~400 feet around the unit. Pt denied dizziness. No LOB with activity. He tolerated activity well. No acute PT needs. 1x eval.     Follow Up Recommendations No PT follow up    Equipment Recommendations  None recommended by PT    Recommendations for Other Services       Precautions / Restrictions Precautions Precautions: Fall Restrictions Weight Bearing Restrictions: No      Mobility  Bed Mobility Overal bed mobility: Independent                  Transfers Overall transfer level: Independent                  Ambulation/Gait Ambulation/Gait assistance: Modified independent (Device/Increase time) Gait Distance (Feet): 400 Feet Assistive device: None Gait Pattern/deviations: Step-through pattern     General Gait Details: good balance, normal gait.  Stairs            Wheelchair Mobility    Modified Rankin (Stroke Patients Only)       Balance Overall balance assessment: Mild deficits observed, not formally tested                                           Pertinent Vitals/Pain Pain Assessment: No/denies pain    Home Living Family/patient expects to be discharged to:: Private residence Living Arrangements: Spouse/significant other Available Help at Discharge: Family;Available 24 hours/day Type of Home: Apartment Home Access: Stairs to enter Entrance Stairs-Rails: Can reach both Entrance Stairs-Number of Steps: 14 Home Layout: One level Home Equipment: None      Prior Function Level of Independence: Independent               Hand Dominance         Extremity/Trunk Assessment   Upper Extremity Assessment Upper Extremity Assessment: Overall WFL for tasks assessed    Lower Extremity Assessment Lower Extremity Assessment: Overall WFL for tasks assessed    Cervical / Trunk Assessment Cervical / Trunk Assessment: Normal  Communication   Communication: No difficulties  Cognition Arousal/Alertness: Awake/alert Behavior During Therapy: WFL for tasks assessed/performed Overall Cognitive Status: Within Functional Limits for tasks assessed                                        General Comments      Exercises     Assessment/Plan    PT Assessment Patent does not need any further PT services  PT Problem List         PT Treatment Interventions      PT Goals (Current goals can be found in the Care Plan section)  Acute Rehab PT Goals Patient Stated Goal: home PT Goal Formulation: All assessment and education complete, DC therapy    Frequency     Barriers to discharge        Co-evaluation               AM-PAC PT "6  Clicks" Mobility  Outcome Measure Help needed turning from your back to your side while in a flat bed without using bedrails?: None Help needed moving from lying on your back to sitting on the side of a flat bed without using bedrails?: None Help needed moving to and from a bed to a chair (including a wheelchair)?: None Help needed standing up from a chair using your arms (e.g., wheelchair or bedside chair)?: None Help needed to walk in hospital room?: None Help needed climbing 3-5 steps with a railing? : None 6 Click Score: 24    End of Session   Activity Tolerance: Patient tolerated treatment well Patient left: in bed;with call bell/phone within reach;with family/visitor present        Time: 1447-1456 PT Time Calculation (min) (ACUTE ONLY): 9 min   Charges:   PT Evaluation $PT Eval Low Complexity: [place], PT Acute Rehabilitation  Office:  [phone] Pager: [phone]

## 2020-12-10 NOTE — Discharge Summary (Signed)
Physician Discharge Summary  Chad Cantu IWP:[id number] DOB: 1951-04-27 DOA: 12/09/2020  PCP: Marcine Matar, MD  Admit date: 12/09/2020 Discharge date: 12/10/2020  Admitted From: Home  Discharge disposition: Home  Recommendations for Outpatient Follow-Up:   . Follow up with your primary care provider in one week.  . Check CBC, BMP, magnesium in the next visit  Discharge Diagnosis:   Principal Problem:   AKI (acute kidney injury) (HCC) Active Problems:   Tobacco abuse   HLD (hyperlipidemia)   Diabetes mellitus type 2 in nonobese (HCC)   Dehydration   Alcohol abuse   Renal cell carcinoma (HCC)   Leukocytosis   Discharge Condition: Improved.  Diet recommendation: Low sodium, heart healthy.  Carbohydrate-modified.    Wound care: None.  Code status: Full.   History of Present Illness:   Chad Cantu is a 69 y.o. 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 Alver [facility] clinic for assessment and consideration for initiation of treatment.  He was complaining of nausea, vomiting and diarrhea for the  few days which had gotten worse with intermittent abdominal pain and decreased appetite..  Patient denied use of alcohol for 3 weeks.   In the ED, patient was hemodynamically stable with slight lower blood pressure but afebrile.  He was found to have AKI with creatinine of 1.74 and white blood cell count of 18.9.  CT of the head without contrast showed no acute intracranial abnormality but showed findings of right sphenoid sinusitis and mild ethmoid sinus mucosal thickening.  CT of the chest with contrast showed multiple pulmonary nodules  suspicious for metastatic disease.  He was given IV fluids and was admitted to hospital for further evaluation and treatment.  Hospital Course:   Following conditions were addressed during hospitalization as listed below,  Nausea, vomiting and diarrhea Likely viral gastroenteritis.  Improved after admission to the hospital.  C. difficile testing was negative.   He did not have further nausea vomiting.  He was able to tolerate this oral diet.    Acute kidney injury secondary nausea vomiting and diarrhea. Resolved at this time.  Initially presented with creatinine of 1.74; creatinine was 1.35 on 12/01/2020.  Received IV fluids with improvement in his renal function.  Creatinine prior to discharge was 0.7.  Leukocytosis Likely reactive.  CT of the chest does not show evidence of infiltrates.    Acute sinusitis as per CT scan.   Asymptomatic.  Will discontinue antibiotic.  Recent diagnosis of stage IV clear-cell renal carcinoma with hepatic and pulmonary metastasis -Dr. Clelia Croft planning to start chemotherapy soon.  Hypercalcemia -Probably related to dehydration.  Improved with IV fluids  Anemia of chronic disease -Probably from cancer.    Hemoglobin of 8.5 at this time.  Thrombocytosis Likely reactive in nature  Diabetes mellitus type 2 We will resume Metformin on discharge.  Hyperlipidemia On Lipitor at home.  Mild hypomagnesemia hypokalemia.  Was replenished prior to discharge.  Will need to monitor as outpatient  Disposition.  At this time, patient is stable for disposition home.  Patient was able to ambulate without any issue.  Physical therapy recommended no skilled therapy needs.  Medical Consultants:    None.  Procedures:    None Subjective:   Today,  patient was seen and examined at bedside.  Patient denies any nausea vomiting abdominal pain or diarrhea.  Able to tolerate p.o. diet.  Was able to ambulate without any issue.  Discharge Exam:   Vitals:    12/10/20 1000 12/10/20 1200  BP: 101/67 111/72  Pulse:    Resp: 15 (!) 24  Temp:  99.3 F (37.4 C)  SpO2:  98%   Vitals:   12/10/20 0700 12/10/20 0800 12/10/20 1000 12/10/20 1200  BP:  109/67 101/67 111/72  Pulse:  79    Resp: 19 20 15  (!) 24  Temp:  98.8 F (37.1 C)  99.3 F (37.4 C)  TempSrc:  Oral  Oral  SpO2:    98%  Weight:      Height:       General: Alert awake, not in obvious distress HENT: pupils equally reacting to light,  No scleral pallor or icterus noted. Oral mucosa is moist.  Chest:  Clear breath sounds.  Diminished breath sounds bilaterally. No crackles or wheezes.  CVS: S1 &S2 heard. No murmur.  Regular rate and rhythm. Abdomen: Soft, nontender, nondistended.  Bowel sounds are heard.   Extremities: No cyanosis, clubbing or edema.  Peripheral pulses are palpable. Psych: Alert, awake and oriented, normal mood CNS:  No cranial nerve deficits.  Power equal in all extremities.   Skin: Warm and dry.  No rashes noted.  The results of significant diagnostics from this hospitalization (including imaging, microbiology, ancillary and laboratory) are listed below for reference.     Diagnostic Studies:   CT Head Wo Contrast  Result Date: 12/09/2020 CLINICAL DATA:  Subarachnoid hemorrhage [facility] suspected; headache, severe, evaluate for mets, subarachnoid hemorrhage. Additional provided: Newly diagnosed renal cancer, headache. EXAM: CT HEAD WITHOUT CONTRAST TECHNIQUE: Contiguous axial images were obtained from the base of the skull through the vertex without intravenous contrast. COMPARISON:  No pertinent prior exams available for comparison. FINDINGS: Brain: Mild cerebral atrophy. Mild ill-defined hypoattenuation within the cerebral white matter is nonspecific, but compatible with chronic small vessel ischemic disease. There is no acute intracranial hemorrhage. No demarcated cortical infarct. No extra-axial fluid collection. No evidence of intracranial mass. No midline  shift. Vascular: No hyperdense vessel.  Atherosclerotic calcifications. Skull: Normal. Negative for fracture or focal lesion. Sinuses/Orbits: Visualized orbits show no acute finding. Mild ethmoid sinus mucosal thickening. Frothy secretions within the right sphenoid sinus. Other: Incidentally noted 1.3 x 0.3 cm right frontal scalp lipoma. IMPRESSION: No evidence of acute intracranial abnormality. There is limited assessment for intracranial metastatic disease on this noncontrast head CT. Within this limitation, no intracranial metastasis is identified. Mild cerebral atrophy and chronic small vessel ischemic disease. Right sphenoid sinusitis. Mild ethmoid sinus mucosal thickening. Incidentally noted 1.3 x 0.3 cm right frontal scalp lipoma. Electronically Signed   By: Jackey LogeKyle  Golden DO   On: 12/09/2020 14:34   CT Chest W Contrast  Result Date: 12/09/2020 CLINICAL DATA:  Staging, metastatic renal cell carcinoma. EXAM: CT CHEST WITH CONTRAST TECHNIQUE: Multidetector CT imaging of the chest was performed during intravenous contrast administration. CONTRAST:  60mL OMNIPAQUE IOHEXOL 300 MG/ML  SOLN COMPARISON:  None. FINDINGS: Cardiovascular: Normal heart size. Trace pericardial fluid, image 99/2. Aortic atherosclerosis. Increase caliber of the main pulmonary artery measures 3.3 cm, image 60/2. 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 Mediastinum/Nodes: No enlarged mediastinal, hilar, or axillary lymph nodes. Thyroid gland, trachea, and esophagus demonstrate no significant findings. Lungs/Pleura: Trace bilateral pleural fluid. No airspace consolidation, atelectasis or pneumothorax. Scattered lung nodules are  identified: -index nodule within the subpleural aspect of the medial right upper lobe measures 1.4 cm, image 31/7. Anterolateral right upper lobe lung nodule measures 7 mm, image 46/7. Subpleural nodule within the posterolateral right upper lobe measures 0.9 cm, image 51/7. Right middle lobe lung nodule measures 5  mm, image 90/7. Subpleural nodule in the posterior right apex measures 1.3 x 0.7 cm, image 21/7. Upper Abdomen: Limited imaging through the upper abdomen again shows multifocal enhancing lesions consistent with metastatic disease. See report of MR abdomen from 11/21/2020. Musculoskeletal: No chest wall abnormality. No acute or significant osseous findings. IMPRESSION: 1. Multiple pulmonary nodules are identified within the right lung, suspicious for metastatic disease. 2. Trace bilateral pleural fluid. 3. Increase caliber of the main pulmonary artery measures 3.3 cm. 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 4. Trace pericardial fluid. 5. Limited imaging through the upper abdomen again shows multifocal enhancing lesions consistent with metastatic disease. See report of MR abdomen from 11/21/2020. 6. Aortic atherosclerosis. Aortic Atherosclerosis (ICD10-I70.0). Electronically Signed   By: Kerby Moors M.D.   On: 12/09/2020 14:35   ECHOCARDIOGRAM COMPLETE  Result Date: 12/10/2020    ECHOCARDIOGRAM REPORT   Patient Name:   SIEGFRIED MERRIN Date of Exam: 12/10/2020 Medical Rec #:  [id number]      Height:       66.0 in Accession #:    [id number]     Weight:       141.5 lb Date of Birth:  [birth year]      BSA:          1.727 m Patient Age:    86 years       BP:           107/65 mmHg Patient Gender: M              HR:           78 bpm. Exam Location:  Inpatient Procedure: 2D Echo, Color Doppler and Cardiac Doppler Indications:    R55 Syncope  History:        Patient has no prior history of Echocardiogram examinations.                 Risk Factors:Hypertension, Diabetes, Dyslipidemia and ETOH                 Abuse.  Sonographer:    Raquel Sarna Senior RDCS Referring Phys: [id number] [place]  1. Left ventricular ejection fraction, by estimation, is 60 to 65%. The left ventricle has normal function. The left ventricle has no regional wall motion abnormalities. There is mild left ventricular hypertrophy. Left  ventricular diastolic parameters were normal.  2. Right ventricular systolic function is normal. The right ventricular size is normal. There is normal pulmonary artery systolic pressure. The estimated right ventricular systolic pressure is [id number] mmHg.  3. A small pericardial effusion is present, measuring up to 0.7cm adjacent to RV  4. The mitral valve is normal in structure. No evidence of mitral valve regurgitation. No evidence of mitral stenosis.  5. The aortic valve is tricuspid. Aortic valve regurgitation is not visualized. Mild aortic valve sclerosis is present, with no evidence of aortic valve stenosis.  6. The inferior vena cava is normal in size with greater than 50% respiratory variability, suggesting right atrial pressure of 3 mmHg. FINDINGS  Left Ventricle: Left ventricular ejection fraction, by estimation, is 60 to 65%. The left ventricle has normal function. The left ventricle has no regional wall motion abnormalities. The left ventricular  internal cavity size was normal in size. There is  mild left ventricular hypertrophy. Left ventricular diastolic parameters were normal. Right Ventricle: The right ventricular size is normal. No increase in right ventricular wall thickness. Right ventricular systolic function is normal. There is normal pulmonary artery systolic pressure. The tricuspid regurgitant velocity is 2.47 m/s, and  with an assumed right atrial pressure of 3 mmHg, the estimated right ventricular systolic pressure is [id number] mmHg. Left Atrium: Left atrial size was normal in size. Right Atrium: Right atrial size was normal in size. Pericardium: A small pericardial effusion is present. Mitral Valve: The mitral valve is normal in structure. No evidence of mitral valve regurgitation. No evidence of mitral valve stenosis. Tricuspid Valve: The tricuspid valve is normal in structure. Tricuspid valve regurgitation is trivial. Aortic Valve: The aortic valve is tricuspid. Aortic valve regurgitation is not  visualized. Mild aortic valve sclerosis is present, with no evidence of aortic valve stenosis. Pulmonic Valve: The pulmonic valve was not well visualized. Pulmonic valve regurgitation is not visualized. Aorta: The aortic root is normal in size and structure. Venous: The inferior vena cava is normal in size with greater than 50% respiratory variability, suggesting right atrial pressure of 3 mmHg. IAS/Shunts: No atrial level shunt detected by color flow Doppler.  LEFT VENTRICLE PLAX 2D LVIDd:         4.00 cm  Diastology LVIDs:         2.30 cm  LV e' medial:    7.40 cm/s LV PW:         1.10 cm  LV E/e' medial:  9.8 LV IVS:        1.10 cm  LV e' lateral:   9.25 cm/s LVOT diam:     2.00 cm  LV E/e' lateral: 7.8 LV SV:         59 LV SV Index:   34 LVOT Area:     3.14 cm  RIGHT VENTRICLE RV S prime:     17.20 cm/s TAPSE (M-mode): 2.3 cm LEFT ATRIUM             Index       RIGHT ATRIUM           Index LA diam:        3.70 cm 2.14 cm/m  RA Area:     14.10 cm LA Vol (A2C):   49.7 ml 28.79 ml/m RA Volume:   31.80 ml  18.42 ml/m LA Vol (A4C):   35.7 ml 20.68 ml/m LA Biplane Vol: 43.8 ml 25.37 ml/m  AORTIC VALVE LVOT Vmax:   93.30 cm/s LVOT Vmean:  60.100 cm/s LVOT VTI:    0.188 m  AORTA Ao Root diam: 2.80 cm MITRAL VALVE               TRICUSPID VALVE MV Area (PHT): 3.48 cm    TR Peak grad:   24.4 mmHg MV Decel Time: 218 msec    TR Vmax:        247.00 cm/s MV E velocity: 72.40 cm/s MV A velocity: 97.70 cm/s  SHUNTS MV E/A ratio:  0.74        Systemic VTI:  0.19 m                            Systemic Diam: 2.00 cm Oswaldo Milian MD Electronically signed by Oswaldo Milian MD Signature Date/Time: 12/10/2020/11:03:20 AM    Final    VAS US  CAROTID  Result Date: 12/10/2020 Carotid Arterial Duplex Study Indications:       Dizziness. Risk Factors:      Hypertension, hyperlipidemia, Diabetes, no history of                    smoking. Comparison Study:  No previous exam Performing Technologist: Vonzell Schlatter RVT   Examination Guidelines: A complete evaluation includes B-mode imaging, spectral Doppler, color Doppler, and power Doppler as needed of all accessible portions of each vessel. Bilateral testing is considered an integral part of a complete examination. Limited examinations for reoccurring indications may be performed as noted.  Right Carotid Findings: +----------+--------+--------+--------+------------------+--------+           PSV cm/sEDV cm/sStenosisPlaque DescriptionComments +----------+--------+--------+--------+------------------+--------+ CCA Prox  89      19                                         +----------+--------+--------+--------+------------------+--------+ CCA Distal78      22                                         +----------+--------+--------+--------+------------------+--------+ ICA Prox  58      21      1-39%   heterogenous               +----------+--------+--------+--------+------------------+--------+ ICA Distal82      29                                         +----------+--------+--------+--------+------------------+--------+ ECA       72      12                                         +----------+--------+--------+--------+------------------+--------+ +----------+--------+-------+--------+-------------------+           PSV cm/sEDV cmsDescribeArm Pressure (mmHG) +----------+--------+-------+--------+-------------------+ [id number]                                         +----------+--------+-------+--------+-------------------+ +---------+--------+--+--------+--+ VertebralPSV cm/s90EDV cm/s26 +---------+--------+--+--------+--+  Left Carotid Findings: +----------+--------+--------+--------+------------------+--------+           PSV cm/sEDV cm/sStenosisPlaque DescriptionComments +----------+--------+--------+--------+------------------+--------+ CCA Prox  124     30                                          +----------+--------+--------+--------+------------------+--------+ CCA Distal91      23                                         +----------+--------+--------+--------+------------------+--------+ ICA Prox  89      26      1-39%   heterogenous               +----------+--------+--------+--------+------------------+--------+ ICA Distal81      26                                         +----------+--------+--------+--------+------------------+--------+  ECA       85      15                                         +----------+--------+--------+--------+------------------+--------+ +----------+--------+--------+--------+-------------------+           PSV cm/sEDV cm/sDescribeArm Pressure (mmHG) +----------+--------+--------+--------+-------------------+ Subclavian120                                         +----------+--------+--------+--------+-------------------+ +---------+--------+--+--------+--+ VertebralPSV cm/s67EDV cm/s21 +---------+--------+--+--------+--+   Summary: Right Carotid: Velocities in the right ICA are consistent with a 1-39% stenosis. Left Carotid: Velocities in the left ICA are consistent with a 1-39% stenosis. Vertebrals:  Bilateral vertebral arteries demonstrate antegrade flow. Subclavians: Normal flow hemodynamics were seen in bilateral subclavian              arteries. *See table(s) above for measurements and observations.     Preliminary      Labs:   Basic Metabolic Panel: Recent Labs  Lab 12/09/20 1220 12/10/20 0509  NA 139 141  K 3.7 3.3*  CL 101 106  CO2 23 25  GLUCOSE 125* 107*  BUN 22 12  CREATININE 1.74* 0.79  CALCIUM 11.0* 9.6  MG  --  1.5*   GFR Estimated Creatinine Clearance: 80.9 mL/min (by C-G formula based on SCr of 0.79 mg/dL). Liver Function Tests: Recent Labs  Lab 12/09/20 1220 12/10/20 0509  AST 27 20  ALT 35 28  ALKPHOS 97 77  BILITOT 0.3 0.3  PROT 9.2* 7.5  ALBUMIN 2.6* 2.1*   No results for  input(s): LIPASE, AMYLASE in the last 168 hours. No results for input(s): AMMONIA in the last 168 hours. Coagulation profile No results for input(s): INR, PROTIME in the last 168 hours.  CBC: Recent Labs  Lab 12/09/20 1220 12/10/20 0509  WBC 18.9* 12.3*  NEUTROABS 14.5*  --   HGB 9.8* 8.5*  HCT 31.5* 26.9*  MCV 83.1 81.0  PLT 539* 432*   Cardiac Enzymes: No results for input(s): CKTOTAL, CKMB, CKMBINDEX, TROPONINI in the last 168 hours. BNP: Invalid input(s): POCBNP CBG: Recent Labs  Lab 12/09/20 1647 12/09/20 2222 12/10/20 0730 12/10/20 1209  GLUCAP 148* 128* 106* 101*   D-Dimer No results for input(s): DDIMER in the last 72 hours. Hgb A1c Recent Labs    12/09/20 1220  HGBA1C 6.1*   Lipid Profile No results for input(s): CHOL, HDL, LDLCALC, TRIG, CHOLHDL, LDLDIRECT in the last 72 hours. Thyroid function studies No results for input(s): TSH, T4TOTAL, T3FREE, THYROIDAB in the last 72 hours.  Invalid input(s): FREET3 Anemia work up No results for input(s): VITAMINB12, FOLATE, FERRITIN, TIBC, IRON, RETICCTPCT in the last 72 hours. Microbiology Recent Results (from the past 240 hour(s))  SARS CORONAVIRUS 2 (TAT 6-24 HRS) Nasopharyngeal Nasopharyngeal Swab     Status: None   Collection Time: 12/09/20  2:43 PM   Specimen: Nasopharyngeal Swab  Result Value Ref Range Status   SARS Coronavirus 2 NEGATIVE NEGATIVE Final    Comment: (NOTE) SARS-CoV-2 target nucleic acids are NOT DETECTED.  The SARS-CoV-2 RNA is generally detectable in upper and lower respiratory specimens during the acute phase of infection. Negative results do not preclude SARS-CoV-2 infection, do not rule out co-infections with other pathogens, and should not be used as the sole  basis for treatment or other patient management decisions. Negative results must be combined with clinical observations, patient history, and epidemiological information. The expected result is Negative.  Fact Sheet for  Patients: SugarRoll.be  Fact Sheet for Healthcare Providers: https://www.woods-mathews.com/  This test is not yet approved or cleared by the [place] FDA and  has been authorized for detection and/or diagnosis of SARS-CoV-2 by FDA under an Emergency Use Authorization (EUA). This EUA will remain  in effect (meaning this test can be used) for the duration of the COVID-19 declaration under Se ction 564(b)(1) of the Act, 21 U.S.C. section 360bbb-3(b)(1), unless the authorization is terminated or revoked sooner.  Performed at [facility], [place] [address] [place], [place] [address]      Discharge Instructions:   Discharge Instructions    Call MD for:  persistant nausea and vomiting   Complete by: As directed    Diet general   Complete by: As directed    Discharge instructions   Complete by: As directed    Follow-up with your primary care provider in 1 week.  Check blood work at that time.  Discuss about your medications causing nausea vomiting and diarrhea.   Increase activity slowly   Complete by: As directed      Allergies as of 12/10/2020   No Known Allergies     Medication List    TAKE these medications   acetaminophen 325 MG tablet Commonly known as: TYLENOL Take 2 tablets (650 mg total) by mouth every 6 (six) hours as needed for mild pain (or Fever >/= 101).   atorvastatin 20 MG tablet Commonly known as: LIPITOR Take 1 tablet (20 mg total) by mouth daily.   metFORMIN 500 MG tablet Commonly known as: GLUCOPHAGE Take 1 tablet (500 mg total) by mouth daily with breakfast.   ondansetron 4 MG tablet Commonly known as: Zofran Take 1 tablet (4 mg total) by mouth daily as needed for nausea or vomiting.   prochlorperazine 10 MG tablet Commonly known as: COMPAZINE Take 1 tablet (10 mg total) by mouth every 6 (six) hours as needed for nausea or vomiting.   VITAMIN C PO Take by mouth.         Time  coordinating discharge: 39 minutes  Signed:  Davis Vannatter  [facility] 12/10/2020, 3:04 PM

## 2020-12-11 ENCOUNTER — Inpatient Hospital Stay: Payer: Medicare Other

## 2020-12-11 LAB — GASTROINTESTINAL PANEL BY PCR, STOOL (REPLACES STOOL CULTURE)

## 2020-12-12 ENCOUNTER — Telehealth: Payer: Self-pay

## 2020-12-12 NOTE — Telephone Encounter (Signed)
  Transition Care Management Follow-up Telephone Call Date of discharge and from where:[facility] on 12/11/2020 How have you been since you were released from the hospital? Pt permits release of information to Jearld Adjutant , stated feeling better since released from the hospital. Any questions or concerns? No questions/concerns reported.  Items Reviewed: Did the pt receive and understand the discharge instructions provided? Stated that have the instructions and have no questions.  Medications obtained and verified? She said that they have the medication list  and the hospital staff reviewed them in detail prior to discharge. She said that he has all of the medications and they have no questions.  Any new allergies since your discharge? None reported  Do you have support at home? Yes, spouse Other (ie: DME, Home Health, etc)       Functional Questionnaire: (I = Independent and D = Dependent) ADL's:  Independent.      Follow up appointments reviewed:    PCP Hospital f/u appt confirmed? Dr Laural Benes on 12/26/2019@ 1050.   Specialist Hospital f/u appt confirmed? Yes Oncology scheduled at this time   Are transportation arrangements needed? Per wife,no. They have transportation    If their condition worsens, is the pt aware to call  their PCP or go to the ED? Yes.Made pt aware if condition worsen or start experiencing rapid weight gain, chest pain, diff breathing, SOB, high fevers, or bleading to refer imediately to ED for further evaluation.   Was the patient provided with contact information for the PCP's office or ED? He has the phone number   Was the pt encouraged to call back with questions or concerns?yes

## 2020-12-16 ENCOUNTER — Other Ambulatory Visit: Payer: Self-pay

## 2020-12-16 ENCOUNTER — Inpatient Hospital Stay: Payer: Medicare Other | Attending: Oncology

## 2020-12-16 ENCOUNTER — Encounter: Payer: Self-pay | Admitting: Oncology

## 2020-12-16 DIAGNOSIS — Z833 Family history of diabetes mellitus: Secondary | ICD-10-CM | POA: Insufficient documentation

## 2020-12-16 DIAGNOSIS — Z5112 Encounter for antineoplastic immunotherapy: Secondary | ICD-10-CM | POA: Insufficient documentation

## 2020-12-16 DIAGNOSIS — E119 Type 2 diabetes mellitus without complications: Secondary | ICD-10-CM | POA: Insufficient documentation

## 2020-12-16 DIAGNOSIS — C787 Secondary malignant neoplasm of liver and intrahepatic bile duct: Secondary | ICD-10-CM | POA: Insufficient documentation

## 2020-12-16 DIAGNOSIS — I10 Essential (primary) hypertension: Secondary | ICD-10-CM | POA: Insufficient documentation

## 2020-12-16 DIAGNOSIS — F17210 Nicotine dependence, cigarettes, uncomplicated: Secondary | ICD-10-CM | POA: Insufficient documentation

## 2020-12-16 DIAGNOSIS — C642 Malignant neoplasm of left kidney, except renal pelvis: Secondary | ICD-10-CM | POA: Insufficient documentation

## 2020-12-16 DIAGNOSIS — Z23 Encounter for immunization: Secondary | ICD-10-CM | POA: Insufficient documentation

## 2020-12-16 DIAGNOSIS — F1021 Alcohol dependence, in remission: Secondary | ICD-10-CM | POA: Insufficient documentation

## 2020-12-16 DIAGNOSIS — Z7984 Long term (current) use of oral hypoglycemic drugs: Secondary | ICD-10-CM | POA: Insufficient documentation

## 2020-12-16 DIAGNOSIS — Z79899 Other long term (current) drug therapy: Secondary | ICD-10-CM | POA: Insufficient documentation

## 2020-12-16 NOTE — Progress Notes (Signed)
Met with patient at registration to introduce myself as Financial Resource Specialist and to offer available resources.  Discussed one-time $1000 [employer] grant and qualifications to assist with personal expenses while going through treatment.  Gave him my card if interested in applying and for any additional financial questions or concerns.  

## 2020-12-16 NOTE — Progress Notes (Signed)
Pharmacist Chemotherapy Monitoring - Initial Assessment    Anticipated start date: 12/22/20   Regimen:  . Are orders appropriate based on the patient's diagnosis, regimen, and cycle? Yes . Does the plan date match the patient's scheduled date? Yes . Is the sequencing of drugs appropriate? Yes . Are the premedications appropriate for the patient's regimen? Yes . Prior Authorization for treatment is: Approved o If applicable, is the correct biosimilar selected based on the patient's insurance? not applicable  Organ Function and Labs: Marland Kitchen Are dose adjustments needed based on the patient's renal function, hepatic function, or hematologic function? No . Are appropriate labs ordered prior to the start of patient's treatment? Yes . Other organ system assessment, if indicated: N/A . The following baseline labs, if indicated, have been ordered: ipilimumab: baseline TSH +/- T4 and nivolumab: baseline TSH +/- T4  Dose Assessment: . Are the drug doses appropriate? Yes . Are the following correct: o Drug concentrations Yes o IV fluid compatible with drug Yes o Administration routes Yes o Timing of therapy Yes . If applicable, does the patient have documented access for treatment and/or plans for port-a-cath placement? no . If applicable, have lifetime cumulative doses been properly documented and assessed? not applicable Lifetime Dose Tracking  No doses have been documented on this patient for the following tracked chemicals: Doxorubicin, Epirubicin, Idarubicin, Daunorubicin, Mitoxantrone, Bleomycin, Oxaliplatin, Carboplatin, Liposomal Doxorubicin  o   Toxicity Monitoring/Prevention: . The patient has the following take home antiemetics prescribed: Ondansetron and Prochlorperazine . The patient has the following take home medications prescribed: N/A . Medication allergies and previous infusion related reactions, if applicable, have been reviewed and addressed. Yes . The patient's current  medication list has been assessed for drug-drug interactions with their chemotherapy regimen. no significant drug-drug interactions were identified on review.  Order Review: . Are the treatment plan orders signed? Yes . Is the patient scheduled to see a provider prior to their treatment? No  I verify that I have reviewed each item in the above checklist and answered each question accordingly.   Ebony Hail, Pharm.D., CPP 12/16/2020@8 :14 AM

## 2020-12-17 ENCOUNTER — Other Ambulatory Visit: Payer: Self-pay | Admitting: Urology

## 2020-12-17 MED FILL — DEXAMETHASONE 1 MG TABLET: 1 | 1 days supply | Qty: 1 | Fill #0

## 2020-12-22 ENCOUNTER — Inpatient Hospital Stay: Payer: Medicare Other

## 2020-12-22 ENCOUNTER — Other Ambulatory Visit: Payer: Self-pay

## 2020-12-22 ENCOUNTER — Encounter: Payer: Self-pay | Admitting: Oncology

## 2020-12-22 ENCOUNTER — Other Ambulatory Visit: Payer: Self-pay | Admitting: Hematology

## 2020-12-22 VITALS — BP 105/70 | HR 72 | Temp 98.7°F | Resp 18

## 2020-12-22 DIAGNOSIS — E119 Type 2 diabetes mellitus without complications: Secondary | ICD-10-CM | POA: Diagnosis not present

## 2020-12-22 DIAGNOSIS — Z833 Family history of diabetes mellitus: Secondary | ICD-10-CM | POA: Diagnosis not present

## 2020-12-22 DIAGNOSIS — F1021 Alcohol dependence, in remission: Secondary | ICD-10-CM | POA: Diagnosis not present

## 2020-12-22 DIAGNOSIS — C649 Malignant neoplasm of unspecified kidney, except renal pelvis: Secondary | ICD-10-CM

## 2020-12-22 DIAGNOSIS — F17210 Nicotine dependence, cigarettes, uncomplicated: Secondary | ICD-10-CM | POA: Diagnosis not present

## 2020-12-22 DIAGNOSIS — Z79899 Other long term (current) drug therapy: Secondary | ICD-10-CM | POA: Diagnosis not present

## 2020-12-22 DIAGNOSIS — Z7984 Long term (current) use of oral hypoglycemic drugs: Secondary | ICD-10-CM | POA: Diagnosis not present

## 2020-12-22 DIAGNOSIS — I10 Essential (primary) hypertension: Secondary | ICD-10-CM | POA: Diagnosis not present

## 2020-12-22 DIAGNOSIS — E032 Hypothyroidism due to medicaments and other exogenous substances: Secondary | ICD-10-CM

## 2020-12-22 DIAGNOSIS — Z5112 Encounter for antineoplastic immunotherapy: Secondary | ICD-10-CM | POA: Diagnosis not present

## 2020-12-22 DIAGNOSIS — C787 Secondary malignant neoplasm of liver and intrahepatic bile duct: Secondary | ICD-10-CM | POA: Diagnosis present

## 2020-12-22 DIAGNOSIS — Z23 Encounter for immunization: Secondary | ICD-10-CM | POA: Diagnosis not present

## 2020-12-22 DIAGNOSIS — C642 Malignant neoplasm of left kidney, except renal pelvis: Secondary | ICD-10-CM | POA: Diagnosis present

## 2020-12-22 LAB — CMP (CANCER CENTER ONLY)
ALT: 11 U/L (ref 0–44)
AST: 9 U/L — ABNORMAL LOW (ref 15–41)
Albumin: 2 g/dL — ABNORMAL LOW (ref 3.5–5.0)
Alkaline Phosphatase: 93 U/L (ref 38–126)
Anion gap: 12 (ref 5–15)
BUN: 13 mg/dL (ref 8–23)
CO2: 23 mmol/L (ref 22–32)
Calcium: 11 mg/dL — ABNORMAL HIGH (ref 8.9–10.3)
Chloride: 102 mmol/L (ref 98–111)
Creatinine: 0.82 mg/dL (ref 0.61–1.24)
GFR, Estimated: >60 mL/min (ref >60)
Glucose, Bld: 118 mg/dL — ABNORMAL HIGH (ref 70–99)
Potassium: 3.3 mmol/L — ABNORMAL LOW (ref 3.5–5.1)
Sodium: 137 mmol/L (ref 135–145)
Total Bilirubin: 0.3 mg/dL (ref 0.3–1.2)
Total Protein: 8.7 g/dL — ABNORMAL HIGH (ref 6.5–8.1)

## 2020-12-22 LAB — CBC WITH DIFFERENTIAL (CANCER CENTER ONLY)
Abs Immature Granulocytes: 0.04 10*3/uL (ref 0.00–0.07)
Basophils Absolute: 0.1 10*3/uL (ref 0.0–0.1)
Basophils Relative: 1 %
Eosinophils Absolute: 0.1 10*3/uL (ref 0.0–0.5)
Eosinophils Relative: 1 %
HCT: 32.3 % — ABNORMAL LOW (ref 39.0–52.0)
Hemoglobin: 10 g/dL — ABNORMAL LOW (ref 13.0–17.0)
Immature Granulocytes: 0 %
Lymphocytes Relative: 24 %
Lymphs Abs: 2.7 10*3/uL (ref 0.7–4.0)
MCH: 25 pg — ABNORMAL LOW (ref 26.0–34.0)
MCHC: 31 g/dL (ref 30.0–36.0)
MCV: 80.8 fL (ref 80.0–100.0)
Monocytes Absolute: 0.9 10*3/uL (ref 0.1–1.0)
Monocytes Relative: 8 %
Neutro Abs: 7.3 10*3/uL (ref 1.7–7.7)
Neutrophils Relative %: 66 %
Platelet Count: 516 10*3/uL — ABNORMAL HIGH (ref 150–400)
RBC: 4 MIL/uL — ABNORMAL LOW (ref 4.22–5.81)
RDW: 15.7 % — ABNORMAL HIGH (ref 11.5–15.5)
WBC Count: 11 10*3/uL — ABNORMAL HIGH (ref 4.0–10.5)
nRBC: 0 % (ref 0.0–0.2)

## 2020-12-22 LAB — TSH: TSH: 0.779 u[IU]/mL (ref 0.320–4.118)

## 2020-12-22 MED ORDER — SODIUM CHLORIDE 0.9 % IV SOLN
Freq: Once | INTRAVENOUS | Status: AC
  Filled 2020-12-22: qty 250

## 2020-12-22 MED ORDER — FAMOTIDINE IN NACL 20-0.9 MG/50ML-% IV SOLN
20.0000 mg | Freq: Once | INTRAVENOUS | Status: AC
  Administered 2020-12-22: 20 mg via INTRAVENOUS

## 2020-12-22 MED ORDER — FAMOTIDINE IN NACL 20-0.9 MG/50ML-% IV SOLN
INTRAVENOUS | Status: AC
  Filled 2020-12-22: qty 50

## 2020-12-22 MED ORDER — SODIUM CHLORIDE 0.9 % IV SOLN
3.1500 mg/kg | Freq: Once | INTRAVENOUS | Status: AC
  Administered 2020-12-22: 200 mg via INTRAVENOUS
  Filled 2020-12-22: qty 20

## 2020-12-22 MED ORDER — SODIUM CHLORIDE 0.9 % IV SOLN
INTRAVENOUS | Status: DC
  Filled 2020-12-22 (×2): qty 250

## 2020-12-22 MED ORDER — DIPHENHYDRAMINE HCL 50 MG/ML IJ SOLN
25.0000 mg | Freq: Once | INTRAMUSCULAR | Status: AC
  Administered 2020-12-22: 25 mg via INTRAVENOUS

## 2020-12-22 MED ORDER — DIPHENHYDRAMINE HCL 50 MG/ML IJ SOLN
INTRAMUSCULAR | Status: AC
  Filled 2020-12-22: qty 1

## 2020-12-22 MED ORDER — SODIUM CHLORIDE 0.9 % IV SOLN
1.0000 mg/kg | Freq: Once | INTRAVENOUS | Status: AC
  Administered 2020-12-22: 65 mg via INTRAVENOUS
  Filled 2020-12-22: qty 13

## 2020-12-22 MED FILL — METFORMIN HCL 500 MG TABS: 500 | 90 days supply | Qty: 90 | Fill #1

## 2020-12-22 MED FILL — LISINOPRIL-HCTZ 10-12.5 MG: 10-12.5 | 90 days supply | Qty: 90 | Fill #1

## 2020-12-22 NOTE — Progress Notes (Signed)

## 2020-12-22 NOTE — Patient Instructions (Signed)
Nivolumab injection What is this medicine? NIVOLUMAB (nye VOL ue mab) is a monoclonal antibody. It treats certain types of cancer. Some of the cancers treated are colon cancer, head and neck cancer, Hodgkin lymphoma, lung cancer, and melanoma. This medicine may be used for other purposes; ask your health care provider or pharmacist if you have questions. COMMON BRAND NAME(S): Opdivo What should I tell my health care provider before I take this medicine? They need to know if you have any of these conditions:  autoimmune diseases like Crohn's disease, ulcerative colitis, or lupus  have had or planning to have an allogeneic stem cell transplant (uses someone else's stem cells)  history of chest radiation  history of organ transplant  nervous system problems like myasthenia gravis or Guillain-Barre syndrome  an unusual or allergic reaction to nivolumab, other medicines, foods, dyes, or preservatives  pregnant or trying to get pregnant  breast-feeding How should I use this medicine? This medicine is for infusion into a vein. It is given by a health care professional in a hospital or clinic setting. A special MedGuide will be given to you before each treatment. Be sure to read this information carefully each time. Talk to your pediatrician regarding the use of this medicine in children. While this drug may be prescribed for children as young as 12 years for selected conditions, precautions do apply. Overdosage: If you think you have taken too much of this medicine contact a poison control center or emergency room at once. NOTE: This medicine is only for you. Do not share this medicine with others. What if I miss a dose? It is important not to miss your dose. Call your doctor or health care professional if you are unable to keep an appointment. What may interact with this medicine? Interactions have not been studied. This list may not describe all possible interactions. Give your health  care provider a list of all the medicines, herbs, non-prescription drugs, or dietary supplements you use. Also tell them if you smoke, drink alcohol, or use illegal drugs. Some items may interact with your medicine. What should I watch for while using this medicine? This drug may make you feel generally unwell. Continue your course of treatment even though you feel ill unless your doctor tells you to stop. You may need blood work done while you are taking this medicine. Do not become pregnant while taking this medicine or for 5 months after stopping it. Women should inform their doctor if they wish to become pregnant or think they might be pregnant. There is a potential for serious side effects to an unborn child. Talk to your health care professional or pharmacist for more information. Do not breast-feed an infant while taking this medicine or for 5 months after stopping it. What side effects may I notice from receiving this medicine? Side effects that you should report to your doctor or health care professional as soon as possible:  allergic reactions like skin rash, itching or hives, swelling of the face, lips, or tongue  breathing problems  blood in the urine  bloody or watery diarrhea or black, tarry stools  changes in emotions or moods  changes in vision  chest pain  cough  dizziness  feeling faint or lightheaded, falls  fever, chills  headache with fever, neck stiffness, confusion, loss of memory, sensitivity to light, hallucination, loss of contact with reality, or seizures  joint pain  mouth sores  redness, blistering, peeling or loosening of the skin, including inside the   mouth  severe muscle pain or weakness  signs and symptoms of high blood sugar such as dizziness; dry mouth; dry skin; fruity breath; nausea; stomach pain; increased hunger or thirst; increased urination  signs and symptoms of kidney injury like trouble passing urine or change in the amount of  urine  signs and symptoms of liver injury like dark yellow or brown urine; general ill feeling or flu-like symptoms; light-colored stools; loss of appetite; nausea; right upper belly pain; unusually weak or tired; yellowing of the eyes or skin  swelling of the ankles, feet, hands  trouble passing urine or change in the amount of urine  unusually weak or tired  weight gain or loss Side effects that usually do not require medical attention (report to your doctor or health care professional if they continue or are bothersome):  bone pain  constipation  decreased appetite  diarrhea  muscle pain  nausea, vomiting  tiredness This list may not describe all possible side effects. Call your doctor for medical advice about side effects. You may report side effects to FDA at 1-800-FDA-1088. Where should I keep my medicine? This drug is given in a hospital or clinic and will not be stored at home. NOTE: This sheet is a summary. It may not cover all possible information. If you have questions about this medicine, talk to your doctor, pharmacist, or health care provider.  2021 Elsevier/Gold Standard (2020-04-02 10:08:25)  Ipilimumab injection What is this medicine? IPILIMUMAB (IP i LIM ue mab) is a monoclonal antibody. It is used to treat colorectal cancer, kidney cancer, liver cancer, lung cancer, melanoma, and mesothelioma. This medicine may be used for other purposes; ask your health care provider or pharmacist if you have questions. COMMON BRAND NAME(S): YERVOY What should I tell my health care provider before I take this medicine? They need to know if you have any of these conditions:  autoimmune diseases like Crohn's disease, ulcerative colitis, or lupus  have had or planning to have an allogeneic stem cell transplant (uses someone else's stem cells)  history of organ transplant  nervous system problems like myasthenia gravis or Guillain-Barre syndrome  an unusual or allergic  reaction to ipilimumab, other medicines, foods, dyes, or preservatives  pregnant or trying to get pregnant  breast-feeding How should I use this medicine? This medicine is for infusion into a vein. It is given by a health care professional in a hospital or clinic setting. A special MedGuide will be given to you before each treatment. Be sure to read this information carefully each time. Talk to your pediatrician regarding the use of this medicine in children. While this drug may be prescribed for children as young as 12 years for selected conditions, precautions do apply. Overdosage: If you think you have taken too much of this medicine contact a poison control center or emergency room at once. NOTE: This medicine is only for you. Do not share this medicine with others. What if I miss a dose? It is important not to miss your dose. Call your doctor or health care professional if you are unable to keep an appointment. What may interact with this medicine? Interactions are not expected. This list may not describe all possible interactions. Give your health care provider a list of all the medicines, herbs, non-prescription drugs, or dietary supplements you use. Also tell them if you smoke, drink alcohol, or use illegal drugs. Some items may interact with your medicine. What should I watch for while using this medicine? Tell   your doctor or healthcare professional if your symptoms do not start to get better or if they get worse. Do not become pregnant while taking this medicine or for 3 months after stopping it. Women should inform their doctor if they wish to become pregnant or think they might be pregnant. There is a potential for serious side effects to an unborn child. Talk to your health care professional or pharmacist for more information. Do not breast-feed an infant while taking this medicine or for 3 months after the last dose. Your condition will be monitored carefully while you are receiving  this medicine. You may need blood work done while you are taking this medicine. What side effects may I notice from receiving this medicine? Side effects that you should report to your doctor or health care professional as soon as possible:  allergic reactions like skin rash, itching or hives, swelling of the face, lips, or tongue  black, tarry stools  bloody or watery diarrhea  changes in vision  dizziness  eye pain  fast, irregular heartbeat  feeling anxious  feeling faint or lightheaded, falls  nausea, vomiting  pain, tingling, numbness in the hands or feet  redness, blistering, peeling or loosening of the skin, including inside the mouth  signs and symptoms of liver injury like dark yellow or brown urine; general ill feeling or flu-like symptoms; light-colored stools; loss of appetite; nausea; right upper belly pain; unusually weak or tired; yellowing of the eyes or skin  unusual bleeding or bruising Side effects that usually do not require medical attention (report to your doctor or health care professional if they continue or are bothersome):  headache  loss of appetite  trouble sleeping This list may not describe all possible side effects. Call your doctor for medical advice about side effects. You may report side effects to FDA at 1-800-FDA-1088. Where should I keep my medicine? This drug is given in a hospital or clinic and will not be stored at home. NOTE: This sheet is a summary. It may not cover all possible information. If you have questions about this medicine, talk to your doctor, pharmacist, or health care provider.  2021 Elsevier/Gold Standard (2019-10-31 18:53:00)  

## 2020-12-22 NOTE — Progress Notes (Signed)
Pt was observed for 29min after administration of the covid vax, pt reports no issues and is resting for remainder of treatment comfortably.

## 2020-12-22 NOTE — Progress Notes (Signed)
Met with patient/spouse at registration to introduce myself as Arboriculturist and to obtain signature for grant.  Patient approved for one-time $1000 grant to assist with personal expenses while going through treatment. Discussed in detail expenses and how they are covered. Gave them a copy of the approval letter and expense sheet along with the Outpatient pharmacy information. He received a gift card today from the grant.  He has my card for any additional financial questions or concerns.

## 2020-12-25 ENCOUNTER — Encounter: Payer: Self-pay | Admitting: Internal Medicine

## 2020-12-25 ENCOUNTER — Telehealth: Payer: Self-pay | Admitting: Internal Medicine

## 2020-12-25 ENCOUNTER — Other Ambulatory Visit: Payer: Self-pay

## 2020-12-25 ENCOUNTER — Ambulatory Visit: Payer: Medicare Other | Attending: Internal Medicine | Admitting: Internal Medicine

## 2020-12-25 VITALS — BP 114/68 | HR 89 | Resp 16 | Wt 144.4 lb

## 2020-12-25 DIAGNOSIS — F17210 Nicotine dependence, cigarettes, uncomplicated: Secondary | ICD-10-CM | POA: Diagnosis not present

## 2020-12-25 DIAGNOSIS — Z7984 Long term (current) use of oral hypoglycemic drugs: Secondary | ICD-10-CM | POA: Diagnosis not present

## 2020-12-25 DIAGNOSIS — I9589 Other hypotension: Secondary | ICD-10-CM

## 2020-12-25 DIAGNOSIS — Z9181 History of falling: Secondary | ICD-10-CM | POA: Insufficient documentation

## 2020-12-25 DIAGNOSIS — E876 Hypokalemia: Secondary | ICD-10-CM | POA: Insufficient documentation

## 2020-12-25 DIAGNOSIS — C787 Secondary malignant neoplasm of liver and intrahepatic bile duct: Secondary | ICD-10-CM | POA: Insufficient documentation

## 2020-12-25 DIAGNOSIS — F1021 Alcohol dependence, in remission: Secondary | ICD-10-CM | POA: Diagnosis not present

## 2020-12-25 DIAGNOSIS — D75839 Thrombocytosis, unspecified: Secondary | ICD-10-CM | POA: Insufficient documentation

## 2020-12-25 DIAGNOSIS — I10 Essential (primary) hypertension: Secondary | ICD-10-CM | POA: Insufficient documentation

## 2020-12-25 DIAGNOSIS — I952 Hypotension due to drugs: Secondary | ICD-10-CM | POA: Insufficient documentation

## 2020-12-25 DIAGNOSIS — Z09 Encounter for follow-up examination after completed treatment for conditions other than malignant neoplasm: Secondary | ICD-10-CM

## 2020-12-25 DIAGNOSIS — E785 Hyperlipidemia, unspecified: Secondary | ICD-10-CM | POA: Insufficient documentation

## 2020-12-25 DIAGNOSIS — E861 Hypovolemia: Secondary | ICD-10-CM | POA: Insufficient documentation

## 2020-12-25 DIAGNOSIS — C7800 Secondary malignant neoplasm of unspecified lung: Secondary | ICD-10-CM | POA: Insufficient documentation

## 2020-12-25 DIAGNOSIS — C642 Malignant neoplasm of left kidney, except renal pelvis: Secondary | ICD-10-CM | POA: Insufficient documentation

## 2020-12-25 DIAGNOSIS — R7303 Prediabetes: Secondary | ICD-10-CM | POA: Insufficient documentation

## 2020-12-25 DIAGNOSIS — Z79899 Other long term (current) drug therapy: Secondary | ICD-10-CM | POA: Diagnosis not present

## 2020-12-25 DIAGNOSIS — Z87891 Personal history of nicotine dependence: Secondary | ICD-10-CM | POA: Diagnosis not present

## 2020-12-25 MED ORDER — SODIUM CHLORIDE 0.9 % IV BOLUS
500.0000 mL | Freq: Once | INTRAVENOUS | Status: AC
  Administered 2020-12-25: 500 mL via INTRAVENOUS

## 2020-12-25 NOTE — Progress Notes (Signed)
Patient ID: Chad Cantu, male    DOB: 02-19-1951  MRN: [id number]  CC: Hospitalization Follow-up   Subjective: Chad Cantu is a 70 y.o. male who presents for hosp f/u/transition of care visit.  His wife Ms. Oh is with him. 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  He was readmitted 12/28-2020 08/2020 with viral gastroenteritis and acute kidney injury secondary to symptoms from the gastroenteritis.  On admission his creatinine was 1.74.  By discharge creatinine was 0.7 post IV fluids.  He was not discharged on any blood pressure medications.  CT of the head without contrast showed no acute intracranial abnormalities but did show findings of right sphenoid sinusitis and mild ethmoid sinus mucosal thickening.  CAT scan of the chest showed multiple pulmonary nodules suspicious for metastatic disease.  Calcium level was elevated likely due to combination of dehydration and underlying cancer diagnosis.Anemia of chronic disease and thrombocytosis thought to be reactive.  Patient was continued on metformin for diagnosis of prediabetes.  Today: Patient complains of some dizziness.  Blood pressure is noted to be low.  Wife reports that his blood pressure yesterday was 94/60.  He fell while getting off the toilet 2 days ago.  Patient states that he went to stand up and felt dizzy.  He did not lose consciousness.  Apparently he has still been taking a blood pressure medication that he has at home.  He is not sure of the name of it.  His wife thinks it is Cozaar.  He took the medicine this morning.  On my last visit with him in person in June he was on Norvasc and lisinopril/HCTZ. -He endorses poor appetite.  His wife makes him a fruit smoothie every morning.   Denies any nausea or vomiting.  No diarrhea or abdominal pain. He stop drinking as of November 16, 2020.  He tells me that he also quit smoking on the same day.  Renal cell cancer with mets to the lungs and the liver: He received first treatment of immunotherapy 4 days ago.  He will be following up with Dr. Alen Blew. Outpatient Encounter Medications as of 12/25/2020  Medication Sig Note  . acetaminophen (TYLENOL) 325 MG tablet Take 2 tablets (650 mg total) by mouth every 6 (six) hours as needed for mild pain (or Fever >/= 101).   . Ascorbic Acid (VITAMIN C PO) Take by mouth.   Marland Kitchen atorvastatin (LIPITOR) 20 MG tablet Take 1 tablet (20 mg total) by mouth daily.   . metFORMIN (GLUCOPHAGE) 500 MG tablet Take 1 tablet (500 mg total) by mouth daily with breakfast. 12/09/2020: No Fill Hx  . ondansetron (ZOFRAN) 4 MG tablet Take 1 tablet (4 mg total) by mouth daily as needed for nausea or vomiting.   . prochlorperazine (COMPAZINE) 10 MG tablet Take 1 tablet (10 mg total) by mouth every 6 (six) hours as needed for nausea or vomiting. 12/09/2020: NEW Rx  . [DISCONTINUED] lisinopril (ZESTRIL) 20 MG tablet Take 20 mg by mouth daily. 09/20/2020: Holding due to hypotensive episode   No facility-administered encounter medications on file as of 12/25/2020.    Patient Active Problem List   Diagnosis Date Noted  . Hypercalcemia 12/22/2020  . Renal cell carcinoma ([place]) 12/09/2020  . Goals of care,  counseling/discussion 12/09/2020  . AKI (acute kidney injury) ([place]) 12/09/2020  . Leukocytosis 12/09/2020  . Alcohol dependence with uncomplicated withdrawal ([place]) 11/16/2020  . Near syncope 09/20/2020  . Dehydration 09/20/2020  . Alcohol abuse 09/20/2020  . Alcohol use disorder, severe, dependence ([place])   . Memory difficulty 05/02/2017  . Rash, skin 05/02/2017  . Overactive bladder 05/02/2017  . Diabetes mellitus type 2 in nonobese ([place]) 05/01/2017  . Alcohol use disorder 05/01/2017  . HLD (hyperlipidemia)  04/09/2015  . Essential hypertension 04/08/2015  . Tobacco abuse 04/08/2015     Current Outpatient Medications on File Prior to Visit  Medication Sig Dispense Refill  . acetaminophen (TYLENOL) 325 MG tablet Take 2 tablets (650 mg total) by mouth every 6 (six) hours as needed for mild pain (or Fever >/= 101).    . Ascorbic Acid (VITAMIN C PO) Take by mouth.    Marland Kitchen atorvastatin (LIPITOR) 20 MG tablet Take 1 tablet (20 mg total) by mouth daily. 90 tablet 3  . metFORMIN (GLUCOPHAGE) 500 MG tablet Take 1 tablet (500 mg total) by mouth daily with breakfast. 90 tablet 2  . ondansetron (ZOFRAN) 4 MG tablet Take 1 tablet (4 mg total) by mouth daily as needed for nausea or vomiting. 30 tablet 0  . prochlorperazine (COMPAZINE) 10 MG tablet Take 1 tablet (10 mg total) by mouth every 6 (six) hours as needed for nausea or vomiting. 30 tablet 0  . [DISCONTINUED] lisinopril (ZESTRIL) 20 MG tablet Take 20 mg by mouth daily.     No current facility-administered medications on file prior to visit.    No Known Allergies  Social History   Socioeconomic History  . Marital status: Married    Spouse name: Not on file  . Number of children: Not on file  . Years of education: Not on file  . Highest education level: Not on file  Occupational History  . Occupation: security guard  Tobacco Use  . Smoking status: Current Every Day Smoker    Packs/day: 2.00    Years: 55.00    Pack years: 110.00    Types: Cigarettes  . Smokeless tobacco: Never Used  Vaping Use  . Vaping Use: Never used  Substance and Sexual Activity  . Alcohol use: Yes    Alcohol/week: 12.0 standard drinks    Types: 12 Cans of beer per week  . Drug use: Yes    Types: Marijuana    Comment: marijuana maybe 2 months ago, remote h/o cocaine use  . Sexual activity: Yes  Other Topics Concern  . Not on file  Social History Narrative  . Not on file   Social Determinants of Health   Financial Resource Strain: Not on file  Food  Insecurity: Not on file  Transportation Needs: Not on file  Physical Activity: Not on file  Stress: Not on file  Social Connections: Not on file  Intimate Partner Violence: Not on file    Family History  Problem Relation Age of Onset  . Diabetes Mother   . Colon cancer Neg Hx   . Colon polyps Neg Hx   . Esophageal cancer Neg Hx   . Rectal cancer Neg Hx   . Stomach cancer Neg Hx     Past Surgical History:  Procedure Laterality Date  . COLONOSCOPY  5 years ago?   at [place] maybe per pt,?unsure results  . LAPAROSCOPIC GASTROTOMY W/ REPAIR OF ULCER      ROS: Review of Systems Negative except as stated above  PHYSICAL EXAM: BP (!) 74/48   Pulse (!) 107   Resp 16   Wt 144 lb 6.4 oz (65.5 kg)   SpO2 97%   BMI 23.31 kg/m   Physical Exam Repeat blood pressure 82/56 sitting with pulse of 100.  Standing blood pressure 85/57 with pulse of 99.  General appearance -elderly African-American male who appears chronically ill. Mental status - normal mood, behavior, speech, dress, motor activity, and thought processes Eyes -pale conjunctiva. Mouth -oral mucosa is moist.  He is edentulous.   Neck - supple, no significant adenopathy.  No carotid bruits. Chest - clear to auscultation, no wheezes, rales or rhonchi, symmetric air entry Heart -mild tachycardia., regular rhythm, normal S1, S2, no murmurs, rubs, clicks or gallops Extremities -no lower extremity edema. Neurologic: Patient ambulates independently.  Power in all 4 extremities are good throughout. Lab Results  Component Value Date   HGBA1C 6.1 (H) 12/09/2020    CMP Latest Ref Rng & Units 12/22/2020 12/10/2020 12/09/2020  Glucose 70 - 99 mg/dL 118(H) 107(H) 125(H)  BUN 8 - 23 mg/dL 13 12 22   Creatinine 0.61 - 1.24 mg/dL 0.82 0.79 1.74(H)  Sodium 135 - 145 mmol/L 137 141 139  Potassium 3.5 - 5.1 mmol/L 3.3(L) 3.3(L) 3.7  Chloride 98 - 111 mmol/L 102 106 101  CO2 22 - 32 mmol/L 23 25 23   Calcium 8.9 - 10.3 mg/dL 11.0(H) 9.6  11.0(H)  Total Protein 6.5 - 8.1 g/dL 8.7(H) 7.5 9.2(H)  Total Bilirubin 0.3 - 1.2 mg/dL 0.3 0.3 0.3  Alkaline Phos 38 - 126 U/L 93 77 97  AST 15 - 41 U/L 9(L) 20 27  ALT 0 - 44 U/L 11 28 35   Lipid Panel     Component Value Date/Time   CHOL 132 06/05/2020 1618   TRIG 145 06/05/2020 1618   HDL 44 06/05/2020 1618   CHOLHDL 3.0 06/05/2020 1618   CHOLHDL 6.5 (H) 01/16/2016 1052   VLDL NOT CALC 01/16/2016 1052   LDLCALC 63 06/05/2020 1618    CBC    Component Value Date/Time   WBC 11.0 (H) 12/22/2020 1112   WBC 12.3 (H) 12/10/2020 0509   RBC 4.00 (L) 12/22/2020 1112   HGB 10.0 (L) 12/22/2020 1112   HGB 14.0 06/05/2020 1618   HCT 32.3 (L) 12/22/2020 1112   HCT 40.9 06/05/2020 1618   PLT 516 (H) 12/22/2020 1112   PLT 284 06/05/2020 1618   MCV 80.8 12/22/2020 1112   MCV 93 06/05/2020 1618   MCH 25.0 (L) 12/22/2020 1112   MCHC 31.0 12/22/2020 1112   RDW 15.7 (H) 12/22/2020 1112   RDW 12.1 06/05/2020 1618   LYMPHSABS 2.7 12/22/2020 1112   MONOABS 0.9 12/22/2020 1112   EOSABS 0.1 12/22/2020 1112   BASOSABS 0.1 12/22/2020 1112    ASSESSMENT AND PLAN: 1. Hospital discharge follow-up 2. Hypotension due to drugs 3. Hypotension due to hypovolemia -I think this is due to poor p.o. intake and him taking blood pressure medication which was stopped at the time of hospital discharge. -Patient given half a liter of fluid here today.  Blood pressure close fluid was 114/68 with pulse of 89.  Patient advised to stop the blood pressure medication that he is taking.  His wife will call back with the name of that medication.  Advised to check blood pressure at least 3 times a week if not daily.  I would like for his systolic blood pressure to remain between 100-130. Recommend supplementing meals with Ensure  or Glucerna shakes. - sodium chloride 0.9 % bolus 500 mL - Basic Metabolic Panel - CBC - sodium chloride 0.9 % bolus 500 mL  4. History of recent fall I think this was due to  orthostatic hypotension for reasons stated in #2 and 3 above.  5. Renal cell carcinoma of left kidney metastatic to other site [facility]) Followed by oncology.  6. Alcohol use disorder, moderate, in early remission ([place]) Commended him on quitting.  Encouraged him to remain free of alcohol.  65. Former smoker Commended him on quitting.  Encouraged him to remain free of tobacco products  8. Prediabetes He will continue metformin.  9. Hypokalemia Will check chemistry today.   Patient was given the opportunity to ask questions.  Patient verbalized understanding of the plan and was able to repeat key elements of the plan.   No orders of the defined types were placed in this encounter.    Requested Prescriptions    No prescriptions requested or ordered in this encounter    No follow-ups on file.  Karle Plumber, MD, FACP

## 2020-12-25 NOTE — Patient Instructions (Addendum)
On your last visit with me in person in June, you were on the blood pressure medications Norvasc and lisinopril/hydrochlorothiazide.  On her first hospitalization in December, they stopped the blood pressure medicine Norvasc.  On your most recent hospitalization, they sent you home without any blood pressure medications.  Given that your blood pressure has been running low, I recommend discontinuing the blood pressure medications.  Continue to monitor blood pressure at home at least 3 days a week.  I would like to see your top number stay between 100-130.

## 2020-12-25 NOTE — Telephone Encounter (Signed)
Will forward to pcp

## 2020-12-25 NOTE — Telephone Encounter (Signed)
Copied from [place] [id number]. Topic: General - Inquiry >> Dec 25, 2020  4:19 PM Greggory Keen D wrote: Reason for CRM: Pt's wife [name] called to let Dr. Wynetta Emery know about her husband's medication.  The name is Lisinopril 10 mg/12.5.  the other is Amlodipine 10 mg.  She said he was taking both of them and should not have been.  CB#  [phone]   I believe this is just an FYI.

## 2020-12-26 LAB — CBC
Hematocrit: 31.8 % — ABNORMAL LOW (ref 37.5–51.0)
Hemoglobin: 9.9 g/dL — ABNORMAL LOW (ref 13.0–17.7)
MCH: 24.4 pg — ABNORMAL LOW (ref 26.6–33.0)
MCHC: 31.1 g/dL — ABNORMAL LOW (ref 31.5–35.7)
MCV: 79 fL (ref 79–97)
Platelets: 513 10*3/uL — ABNORMAL HIGH (ref 150–450)
RBC: 4.05 x10E6/uL — ABNORMAL LOW (ref 4.14–5.80)
RDW: 14.4 % (ref 11.6–15.4)
WBC: 10.8 10*3/uL (ref 3.4–10.8)

## 2020-12-26 LAB — BASIC METABOLIC PANEL
BUN/Creatinine Ratio: 14 (ref 10–24)
BUN: 11 mg/dL (ref 8–27)
CO2: 24 mmol/L (ref 20–29)
Calcium: 10.4 mg/dL — ABNORMAL HIGH (ref 8.6–10.2)
Chloride: 99 mmol/L (ref 96–106)
Creatinine, Ser: 0.79 mg/dL (ref 0.76–1.27)
GFR calc Af Amer: 107 mL/min/{1.73_m2} (ref >59)
GFR calc non Af Amer: 93 mL/min/{1.73_m2} (ref >59)
Glucose: 97 mg/dL (ref 65–99)
Potassium: 4.1 mmol/L (ref 3.5–5.2)
Sodium: 139 mmol/L (ref 134–144)

## 2021-01-06 ENCOUNTER — Ambulatory Visit: Payer: Medicare Other | Admitting: Internal Medicine

## 2021-01-13 ENCOUNTER — Other Ambulatory Visit: Payer: Self-pay

## 2021-01-13 ENCOUNTER — Telehealth: Payer: Self-pay

## 2021-01-13 ENCOUNTER — Other Ambulatory Visit: Payer: Self-pay | Admitting: Oncology

## 2021-01-13 ENCOUNTER — Inpatient Hospital Stay: Payer: Medicare Other | Attending: Oncology

## 2021-01-13 ENCOUNTER — Inpatient Hospital Stay: Payer: Medicare Other

## 2021-01-13 ENCOUNTER — Inpatient Hospital Stay (HOSPITAL_BASED_OUTPATIENT_CLINIC_OR_DEPARTMENT_OTHER): Payer: Medicare Other | Admitting: Oncology

## 2021-01-13 VITALS — Temp 98.3°F

## 2021-01-13 VITALS — BP 103/74 | HR 100 | Temp 97.6°F | Resp 18 | Ht 66.0 in | Wt 134.8 lb

## 2021-01-13 DIAGNOSIS — F1910 Other psychoactive substance abuse, uncomplicated: Secondary | ICD-10-CM | POA: Diagnosis not present

## 2021-01-13 DIAGNOSIS — Z7984 Long term (current) use of oral hypoglycemic drugs: Secondary | ICD-10-CM | POA: Diagnosis not present

## 2021-01-13 DIAGNOSIS — D649 Anemia, unspecified: Secondary | ICD-10-CM | POA: Diagnosis not present

## 2021-01-13 DIAGNOSIS — C642 Malignant neoplasm of left kidney, except renal pelvis: Secondary | ICD-10-CM | POA: Insufficient documentation

## 2021-01-13 DIAGNOSIS — Z5112 Encounter for antineoplastic immunotherapy: Secondary | ICD-10-CM | POA: Insufficient documentation

## 2021-01-13 DIAGNOSIS — C641 Malignant neoplasm of right kidney, except renal pelvis: Secondary | ICD-10-CM

## 2021-01-13 DIAGNOSIS — C787 Secondary malignant neoplasm of liver and intrahepatic bile duct: Secondary | ICD-10-CM | POA: Diagnosis present

## 2021-01-13 DIAGNOSIS — C649 Malignant neoplasm of unspecified kidney, except renal pelvis: Secondary | ICD-10-CM

## 2021-01-13 DIAGNOSIS — C7801 Secondary malignant neoplasm of right lung: Secondary | ICD-10-CM | POA: Diagnosis not present

## 2021-01-13 DIAGNOSIS — R630 Anorexia: Secondary | ICD-10-CM | POA: Insufficient documentation

## 2021-01-13 DIAGNOSIS — Z79899 Other long term (current) drug therapy: Secondary | ICD-10-CM | POA: Insufficient documentation

## 2021-01-13 DIAGNOSIS — D500 Iron deficiency anemia secondary to blood loss (chronic): Secondary | ICD-10-CM

## 2021-01-13 DIAGNOSIS — E032 Hypothyroidism due to medicaments and other exogenous substances: Secondary | ICD-10-CM

## 2021-01-13 LAB — CBC WITH DIFFERENTIAL (CANCER CENTER ONLY)
Abs Immature Granulocytes: 0.06 10*3/uL (ref 0.00–0.07)
Basophils Absolute: 0.1 10*3/uL (ref 0.0–0.1)
Basophils Relative: 1 %
Eosinophils Absolute: 0.2 10*3/uL (ref 0.0–0.5)
Eosinophils Relative: 2 %
HCT: 29.7 % — ABNORMAL LOW (ref 39.0–52.0)
Hemoglobin: 9.3 g/dL — ABNORMAL LOW (ref 13.0–17.0)
Immature Granulocytes: 0 %
Lymphocytes Relative: 24 %
Lymphs Abs: 3.4 10*3/uL (ref 0.7–4.0)
MCH: 24.1 pg — ABNORMAL LOW (ref 26.0–34.0)
MCHC: 31.3 g/dL (ref 30.0–36.0)
MCV: 76.9 fL — ABNORMAL LOW (ref 80.0–100.0)
Monocytes Absolute: 1.4 10*3/uL — ABNORMAL HIGH (ref 0.1–1.0)
Monocytes Relative: 10 %
Neutro Abs: 8.9 10*3/uL — ABNORMAL HIGH (ref 1.7–7.7)
Neutrophils Relative %: 63 %
Platelet Count: 644 10*3/uL — ABNORMAL HIGH (ref 150–400)
RBC: 3.86 MIL/uL — ABNORMAL LOW (ref 4.22–5.81)
RDW: 15.9 % — ABNORMAL HIGH (ref 11.5–15.5)
WBC Count: 14.1 10*3/uL — ABNORMAL HIGH (ref 4.0–10.5)
nRBC: 0 % (ref 0.0–0.2)

## 2021-01-13 LAB — CMP (CANCER CENTER ONLY)
ALT: 8 U/L (ref 0–44)
AST: 9 U/L — ABNORMAL LOW (ref 15–41)
Albumin: 2 g/dL — ABNORMAL LOW (ref 3.5–5.0)
Alkaline Phosphatase: 85 U/L (ref 38–126)
Anion gap: 9 (ref 5–15)
BUN: 10 mg/dL (ref 8–23)
CO2: 27 mmol/L (ref 22–32)
Calcium: 11.3 mg/dL — ABNORMAL HIGH (ref 8.9–10.3)
Chloride: 101 mmol/L (ref 98–111)
Creatinine: 0.82 mg/dL (ref 0.61–1.24)
GFR, Estimated: >60 mL/min (ref >60)
Glucose, Bld: 96 mg/dL (ref 70–99)
Potassium: 4.3 mmol/L (ref 3.5–5.1)
Sodium: 137 mmol/L (ref 135–145)
Total Bilirubin: 0.3 mg/dL (ref 0.3–1.2)
Total Protein: 8.6 g/dL — ABNORMAL HIGH (ref 6.5–8.1)

## 2021-01-13 LAB — TSH: TSH: 0.684 u[IU]/mL (ref 0.320–4.118)

## 2021-01-13 MED ORDER — SODIUM CHLORIDE 0.9 % IV SOLN
1.0000 mg/kg | Freq: Once | INTRAVENOUS | Status: AC
  Administered 2021-01-13: 65 mg via INTRAVENOUS
  Filled 2021-01-13: qty 13

## 2021-01-13 MED ORDER — ZOLEDRONIC ACID 4 MG/100ML IV SOLN
INTRAVENOUS | Status: AC
  Filled 2021-01-13: qty 100

## 2021-01-13 MED ORDER — MEGESTROL ACETATE 400 MG/10ML PO SUSP: 400.0000 mg | Freq: Two times a day (BID) | ORAL | 0 refills | Status: DC

## 2021-01-13 MED ORDER — FAMOTIDINE IN NACL 20-0.9 MG/50ML-% IV SOLN
INTRAVENOUS | Status: AC
  Filled 2021-01-13: qty 50

## 2021-01-13 MED ORDER — DIPHENHYDRAMINE HCL 50 MG/ML IJ SOLN
INTRAMUSCULAR | Status: AC
  Filled 2021-01-13: qty 1

## 2021-01-13 MED ORDER — DIPHENHYDRAMINE HCL 50 MG/ML IJ SOLN
25.0000 mg | Freq: Once | INTRAMUSCULAR | Status: AC
  Administered 2021-01-13: 25 mg via INTRAVENOUS

## 2021-01-13 MED ORDER — FAMOTIDINE IN NACL 20-0.9 MG/50ML-% IV SOLN
20.0000 mg | Freq: Once | INTRAVENOUS | Status: AC
  Administered 2021-01-13: 20 mg via INTRAVENOUS

## 2021-01-13 MED ORDER — SODIUM CHLORIDE 0.9 % IV SOLN
Freq: Once | INTRAVENOUS | Status: AC
  Filled 2021-01-13: qty 250

## 2021-01-13 MED ORDER — ZOLEDRONIC ACID 4 MG/100ML IV SOLN
4.0000 mg | Freq: Once | INTRAVENOUS | Status: AC
  Administered 2021-01-13: 4 mg via INTRAVENOUS

## 2021-01-13 MED ORDER — NIVOLUMAB CHEMO INJECTION 100 MG/10ML
3.1500 mg/kg | Freq: Once | INTRAVENOUS | Status: AC
  Administered 2021-01-13: 200 mg via INTRAVENOUS
  Filled 2021-01-13: qty 20

## 2021-01-13 NOTE — Patient Instructions (Addendum)
[facility] Discharge Instructions for Patients Receiving Chemotherapy  Today you received the following chemotherapy agents Opdivo and Yervoy  To help prevent nausea and vomiting after your treatment, we encourage you to take your nausea medication as directed.    If you develop nausea and vomiting that is not controlled by your nausea medication, call the clinic.   BELOW ARE SYMPTOMS THAT SHOULD BE REPORTED IMMEDIATELY:  *FEVER GREATER THAN 100.5 F  *CHILLS WITH OR WITHOUT FEVER  NAUSEA AND VOMITING THAT IS NOT CONTROLLED WITH YOUR NAUSEA MEDICATION  *UNUSUAL SHORTNESS OF BREATH  *UNUSUAL BRUISING OR BLEEDING  TENDERNESS IN MOUTH AND THROAT WITH OR WITHOUT PRESENCE OF ULCERS  *URINARY PROBLEMS  *BOWEL PROBLEMS  UNUSUAL RASH Items with * indicate a potential emergency and should be followed up as soon as possible.  Feel free to call the clinic should you have any questions or concerns. The clinic phone number is [phone].  Please show the CHEMO ALERT CARD at check-in to the Emergency Department and triage nurse.   

## 2021-01-13 NOTE — Telephone Encounter (Signed)
-----   Message from [name], MD sent at 01/13/2021  9:17 AM EST ----- Regarding: RE: Patient Concerns These complaints could be related to his treatment and likely related to his cancer.  I anticipate improvement as his cancer gets better.  ----- Message ----- From: Teodoro Spray, RN Sent: 01/13/2021   9:03 AM EST To: [name], MD Subject: Patient Concerns                               Patient arrived in infusion and had a list of questions that he was to ask you during his appointment this AM, but he states he forgot.   Per wife, patient has been running a fever (100.7) every day since last infusion. Wife has been checking temp underneath patient's armpit.  Temp (oral) today was 98.3. Last dose of tylenol was last evening. Patient is concerned the temperature is due to the chemo.   Patient also is concerned that his voice has become "grainy". Patient wants to know if this is due to chemo.   Please advise.   Also,  your secure chat is set to unavailable.

## 2021-01-13 NOTE — Telephone Encounter (Signed)
Please see message below. Patient informed of Dr. Hazeline Junker response and verbalized understanding. Instructed patient to call office with any additional questions or concerns.

## 2021-01-13 NOTE — Progress Notes (Signed)
Hematology and Oncology Follow Up Visit  Chad Cantu [id number] [birth year] 70 y.o. 01/13/2021 8:03 AM [name], MDJohnson, [name], MD   Principle Diagnosis: 70 year old with kidney cancer diagnosed in December 2021.  He was found to have stage IV clear-cell renal cell carcinoma and 5.7 x 5.6 left kidney mass and several metastasis include hepatic lesions and pulmonary involvement   Prior Therapy:  He is status post ultrasound biopsy of the hepatic lesion on December 01, 2020.  The final pathology confirmed the presence of metastatic clear-cell renal cell carcinoma.  Current therapy: Ipilimumab 1 mg/kg and nivolumab 3 mg/kg therapy is started on December 23, 2019.  He is receiving this regimen every 3 weeks.  He is here for cycle 2.  Interim History: Mr. Rollo returns today for a follow-up visit.  Since her last visit, he was hospitalized briefly on December 20 8 December 29th for mild dehydration and nausea and vomiting and recovered reasonably well.  He completed for cycle therapy on January 10.  Since that time, he reports feeling reasonably fair at this time without any recent hospitalizations or illnesses.  He denies any nausea or vomiting or abdominal pain.  He denies any dizziness or tenderness.  He has abstained from alcohol drinking.  He does report poor appetite and has lost 10 pounds since last visit.  He denies any complications related to therapy.     Medications: I have reviewed the patient's current medications.  Current Outpatient Medications  Medication Sig Dispense Refill  . acetaminophen (TYLENOL) 325 MG tablet Take 2 tablets (650 mg total) by mouth every 6 (six) hours as needed for mild pain (or Fever >/= 101).    . Ascorbic Acid (VITAMIN C PO) Take by mouth.    Marland Kitchen atorvastatin (LIPITOR) 20 MG tablet Take 1 tablet (20 mg total) by mouth daily. 90 tablet 3  . metFORMIN (GLUCOPHAGE) 500 MG tablet Take 1 tablet (500 mg total) by mouth daily with breakfast. 90  tablet 2  . ondansetron (ZOFRAN) 4 MG tablet Take 1 tablet (4 mg total) by mouth daily as needed for nausea or vomiting. 30 tablet 0  . prochlorperazine (COMPAZINE) 10 MG tablet Take 1 tablet (10 mg total) by mouth every 6 (six) hours as needed for nausea or vomiting. 30 tablet 0   No current facility-administered medications for this visit.     Allergies: No Known Allergies   Physical Exam: Blood pressure 103/74, pulse 100, temperature 97.6 F (36.4 C), temperature source Tympanic, resp. rate 18, height 5\' 6"  (1.676 m), weight 134 lb 12.8 oz (61.1 kg), SpO2 99 %.   ECOG: 1   General appearance: Comfortable appearing without any discomfort Head: Normocephalic without any trauma Oropharynx: Mucous membranes are moist and pink without any thrush or ulcers. Eyes: Pupils are equal and round reactive to light. Lymph nodes: No cervical, supraclavicular, inguinal or axillary lymphadenopathy.   Heart:regular rate and rhythm.  S1 and S2 without leg edema. Lung: Clear without any rhonchi or wheezes.  No dullness to percussion. Abdomin: Soft, nontender, nondistended with good bowel sounds.  No hepatosplenomegaly. Musculoskeletal: No joint deformity or effusion.  Full range of motion noted. Neurological: No deficits noted on motor, sensory and deep tendon reflex exam. Skin: No petechial rash or dryness.  Appeared moist.      Lab Results: Lab Results  Component Value Date   WBC 10.8 12/25/2020   HGB 9.9 (L) 12/25/2020   HCT 31.8 (L) 12/25/2020   MCV 79 12/25/2020  PLT 513 (H) 12/25/2020     Chemistry      Component Value Date/Time   NA 139 12/25/2020 1236   K 4.1 12/25/2020 1236   CL 99 12/25/2020 1236   CO2 24 12/25/2020 1236   BUN 11 12/25/2020 1236   CREATININE 0.79 12/25/2020 1236   CREATININE 0.82 12/22/2020 1112   CREATININE 0.80 01/16/2016 1052      Component Value Date/Time   CALCIUM 10.4 (H) 12/25/2020 1236   ALKPHOS 93 12/22/2020 1112   AST 9 (L) 12/22/2020  1112   ALT 11 12/22/2020 1112   BILITOT 0.3 12/22/2020 1112       Radiological Studies: IMPRESSION: 1. Multiple pulmonary nodules are identified within the right lung, suspicious for metastatic disease. 2. Trace bilateral pleural fluid. 3. Increase caliber of the main pulmonary artery measures 3.3 cm. 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 4. Trace pericardial fluid. 5. Limited imaging through the upper abdomen again shows multifocal enhancing lesions consistent with metastatic disease. See report of MR abdomen from 11/21/2020. 6. Aortic atherosclerosis.  Aortic Atherosclerosis (ICD10-I70.0).  Impression and Plan:  70 year old with:  1.    Kidney cancer diagnosed in December 2021.  He presented with stage IV clear-cell  with a left kidney mass measuring 5.8 x 5.5 x 4.9 cm in addition to pulmonary and hepatic metastasis.   He is currently receiving ipilimumab and nivolumab without any major complications.  Risks and benefits of proceeding with cycle 2 of therapy were discussed.  Complications that include GI toxicity as well as immune mediated complications were reviewed.  He is agreeable to proceed and the plan is to repeat imaging studies after cycle 4.  Different salvage therapy options including oral targeted therapy with immunotherapy could be an option.  2.  Immune mediated complications: I continue to educate him about potential complications such as pneumonitis, colitis and thyroid disease.   3.  IV access: No issues with his peripheral veins at this time.   4.  Antiemetics: No nausea or vomiting.  Compazine is available to him.  5.  Goals of care and prognosis: Therapy remains palliative with aggressive measures are warranted.  6.  Polysubstance abuse: He reports no alcohol or tobacco use at this time.  7.  Anorexia: We have discussed strategies to boost his appetite and his nutritional intake.  Prescription for Megace will be available to him.  8.   Anemia: His MCV is low and will update his iron studies and replace as needed.  9.  Follow-up: In 3 weeks for the next cycle of therapy.  30  minutes were spent on this encounter.  The time was dedicated to reviewing laboratory data, disease status update and future plan of care discussion.    Zola Button, MD 2/1/20228:03 AM

## 2021-01-13 NOTE — Progress Notes (Signed)
Ipilimumab (YERVOY) Patient Monitoring Assessment   Is the patient experiencing any of the following general symptoms?:  [] Difficulty performing normal activities [] Feeling sluggish or cold all the time [] Unusual weight gain [] Constant or unusual headaches [] Feeling dizzy or faint [] Changes in eyesight (blurry vision, double vision, or other vision problems) [] Changes in mood or behavior (ex: decreased sex drive, irritability, or forgetfulness) [] Starting new medications (ex: steroids, other medications that lower immune response) [x] Patient is not experiencing any of the general symptoms above.    Gastrointestinal  Patient is having 1 bowel movements each week.  Is this different from baseline? [] Yes [x] No Are your stools watery or do they have a foul smell? [x] Yes [x] No Have you seen blood in your stools? [] Yes [x] No Are your stools dark, tarry, or sticky? [] Yes [x] No Are you having pain or tenderness in your belly? [] Yes [x] No  Skin Does your skin itch? [] Yes [x] No Do you have a rash? [] Yes [x] No Has your skin blistered and/or peeled? [] Yes [x] No Do you have sores in your mouth? [] Yes [x] No  Hepatic Has your urine been dark or tea colored? [] Yes [x] No Have you noticed that your skin or the whites of your eyes are turning yellow? [] Yes [x] No Are you bleeding or bruising more easily than normal? [] Yes [x] No Are you nauseous and/or vomiting? [] Yes [x] No Do you have pain on the right side of your stomach? [] Yes [x] No  Neurologic  Are you having unusual weakness of legs, arms, or face? [] Yes [x] No Are you having numbness or tingling in your hands or feet? Unchanged [x] Yes [] No  Veverly Fells

## 2021-01-15 ENCOUNTER — Telehealth: Payer: Self-pay | Admitting: Oncology

## 2021-01-15 MED FILL — MEGESTROL ACET 40 MG/ML SUS: 40 | 12 days supply | Qty: 240 | Fill #0

## 2021-01-15 NOTE — Telephone Encounter (Signed)
Called to inform patient of his rescheduled appointment. Patient's wife is aware.

## 2021-01-29 ENCOUNTER — Other Ambulatory Visit: Payer: Self-pay | Admitting: Oncology

## 2021-01-30 NOTE — Telephone Encounter (Signed)
Refill request

## 2021-02-02 ENCOUNTER — Other Ambulatory Visit: Payer: Self-pay | Admitting: Oncology

## 2021-02-02 ENCOUNTER — Telehealth: Payer: Self-pay | Admitting: Oncology

## 2021-02-02 ENCOUNTER — Telehealth: Payer: Self-pay

## 2021-02-02 ENCOUNTER — Inpatient Hospital Stay (HOSPITAL_BASED_OUTPATIENT_CLINIC_OR_DEPARTMENT_OTHER): Payer: Medicare Other | Admitting: Oncology

## 2021-02-02 ENCOUNTER — Other Ambulatory Visit: Payer: Self-pay

## 2021-02-02 ENCOUNTER — Inpatient Hospital Stay: Payer: Medicare Other

## 2021-02-02 VITALS — BP 98/65 | HR 109 | Temp 97.0°F | Resp 18 | Ht 66.0 in | Wt 131.3 lb

## 2021-02-02 DIAGNOSIS — C649 Malignant neoplasm of unspecified kidney, except renal pelvis: Secondary | ICD-10-CM

## 2021-02-02 DIAGNOSIS — C641 Malignant neoplasm of right kidney, except renal pelvis: Secondary | ICD-10-CM

## 2021-02-02 DIAGNOSIS — E032 Hypothyroidism due to medicaments and other exogenous substances: Secondary | ICD-10-CM

## 2021-02-02 DIAGNOSIS — D500 Iron deficiency anemia secondary to blood loss (chronic): Secondary | ICD-10-CM

## 2021-02-02 DIAGNOSIS — Z5112 Encounter for antineoplastic immunotherapy: Secondary | ICD-10-CM | POA: Diagnosis not present

## 2021-02-02 LAB — IRON AND TIBC
Iron: 17 ug/dL — ABNORMAL LOW (ref 42–163)
Saturation Ratios: 9 % — ABNORMAL LOW (ref 20–55)
TIBC: 190 ug/dL — ABNORMAL LOW (ref 202–409)
UIBC: 173 ug/dL (ref 117–376)

## 2021-02-02 LAB — CBC WITH DIFFERENTIAL (CANCER CENTER ONLY)
Abs Immature Granulocytes: 0.14 10*3/uL — ABNORMAL HIGH (ref 0.00–0.07)
Basophils Absolute: 0.1 10*3/uL (ref 0.0–0.1)
Basophils Relative: 1 %
Eosinophils Absolute: 0.1 10*3/uL (ref 0.0–0.5)
Eosinophils Relative: 0 %
HCT: 25.8 % — ABNORMAL LOW (ref 39.0–52.0)
Hemoglobin: 8 g/dL — ABNORMAL LOW (ref 13.0–17.0)
Immature Granulocytes: 1 %
Lymphocytes Relative: 17 %
Lymphs Abs: 3.1 10*3/uL (ref 0.7–4.0)
MCH: 22.9 pg — ABNORMAL LOW (ref 26.0–34.0)
MCHC: 31 g/dL (ref 30.0–36.0)
MCV: 73.9 fL — ABNORMAL LOW (ref 80.0–100.0)
Monocytes Absolute: 1.5 10*3/uL — ABNORMAL HIGH (ref 0.1–1.0)
Monocytes Relative: 9 %
Neutro Abs: 13.1 10*3/uL — ABNORMAL HIGH (ref 1.7–7.7)
Neutrophils Relative %: 72 %
Platelet Count: 672 10*3/uL — ABNORMAL HIGH (ref 150–400)
RBC: 3.49 MIL/uL — ABNORMAL LOW (ref 4.22–5.81)
RDW: 17.3 % — ABNORMAL HIGH (ref 11.5–15.5)
WBC Count: 18.1 10*3/uL — ABNORMAL HIGH (ref 4.0–10.5)
nRBC: 0 % (ref 0.0–0.2)

## 2021-02-02 LAB — CMP (CANCER CENTER ONLY)
ALT: 12 U/L (ref 0–44)
AST: 14 U/L — ABNORMAL LOW (ref 15–41)
Albumin: 2 g/dL — ABNORMAL LOW (ref 3.5–5.0)
Alkaline Phosphatase: 96 U/L (ref 38–126)
Anion gap: 12 (ref 5–15)
BUN: 17 mg/dL (ref 8–23)
CO2: 18 mmol/L — ABNORMAL LOW (ref 22–32)
Calcium: 9.3 mg/dL (ref 8.9–10.3)
Chloride: 109 mmol/L (ref 98–111)
Creatinine: 1.28 mg/dL — ABNORMAL HIGH (ref 0.61–1.24)
GFR, Estimated: >60 mL/min (ref >60)
Glucose, Bld: 111 mg/dL — ABNORMAL HIGH (ref 70–99)
Potassium: 4.2 mmol/L (ref 3.5–5.1)
Sodium: 139 mmol/L (ref 135–145)
Total Bilirubin: <0.2 mg/dL — ABNORMAL LOW (ref 0.3–1.2)
Total Protein: 9.2 g/dL — ABNORMAL HIGH (ref 6.5–8.1)

## 2021-02-02 LAB — TSH: TSH: 0.985 u[IU]/mL (ref 0.320–4.118)

## 2021-02-02 LAB — FERRITIN: Ferritin: 1789 ng/mL — ABNORMAL HIGH (ref 24–336)

## 2021-02-02 MED ORDER — MEGESTROL ACETATE 40 MG/ML PO SUSP: 400.0000 mg | Freq: Every day | ORAL | 1 refills | Status: DC

## 2021-02-02 MED ORDER — FERROUS SULFATE 325 (65 FE) MG PO TBEC: 325.0000 mg | DELAYED_RELEASE_TABLET | Freq: Two times a day (BID) | ORAL | 3 refills | Status: DC

## 2021-02-02 MED FILL — MEGESTROL ACET 40 MG/ML SUS: 40 | 24 days supply | Qty: 240 | Fill #0

## 2021-02-02 NOTE — Progress Notes (Signed)
Hematology and Oncology Follow Up Visit  Chad Cantu [id number] 1951/08/28 70 y.o. 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 He was found to have IMDC intermediate risk category.   Prior Therapy:  He is status post ultrasound biopsy of the hepatic lesion on December 01, 2020.  The final pathology confirmed the presence of metastatic clear-cell renal cell carcinoma.  Current therapy: Ipilimumab 1 mg/kg and nivolumab 3 mg/kg therapy is started on December 23, 2019.  He is receiving this regimen every 3 weeks.  He is here for evaluation prior to cycle 3.  Interim History: Chad Cantu is here for repeat follow-up. Since the last visit, he reports feeling well without any major complaints.  He has been eating better with the help of Megace without any new complaints.  He has gained close to 6 pounds since the last visit and eating better.  His performance status quality of life remained reasonable.  He has not required any hospitalization at this time.  He denies any complications including diarrhea, dyspnea on exertion or changes in his mental status.     Medications: Updated on review. Current Outpatient Medications  Medication Sig Dispense Refill  . acetaminophen (TYLENOL) 325 MG tablet Take 2 tablets (650 mg total) by mouth every 6 (six) hours as needed for mild pain (or Fever >/= 101).    . Ascorbic Acid (VITAMIN C PO) Take by mouth.    Marland Kitchen atorvastatin (LIPITOR) 20 MG tablet Take 1 tablet (20 mg total) by mouth daily. 90 tablet 3  . megestrol (MEGACE) 40 MG/ML suspension TAKE 10 MLS (400 MG TOTAL) BY MOUTH 2 (TWO) TIMES DAILY. 240 mL 0  . metFORMIN (GLUCOPHAGE) 500 MG tablet Take 1 tablet (500 mg total) by mouth daily with breakfast. 90 tablet 2  . ondansetron (ZOFRAN) 4 MG tablet Take 1 tablet (4 mg total) by mouth daily  as needed for nausea or vomiting. 30 tablet 0  . prochlorperazine (COMPAZINE) 10 MG tablet Take 1 tablet (10 mg total) by mouth every 6 (six) hours as needed for nausea or vomiting. 30 tablet 0   No current facility-administered medications for this visit.     Allergies: No Known Allergies   Physical Exam: Blood pressure 98/65, pulse (!) 109, temperature (!) 97 F (36.1 C), temperature source Tympanic, resp. rate 18, height 5\' 6"  (1.676 m), weight 131 lb 4.8 oz (59.6 kg), SpO2 100 %.    ECOG: 1    General appearance: Alert, awake without any distress. Head: Atraumatic without abnormalities Oropharynx: Without any thrush or ulcers. Eyes: No scleral icterus. Lymph nodes: No lymphadenopathy noted in the cervical, supraclavicular, or axillary nodes Heart:regular rate and rhythm, without any murmurs or gallops.   Lung: Clear to auscultation without any rhonchi, wheezes or dullness to percussion. Abdomin: Soft, nontender without any shifting dullness or ascites. Musculoskeletal: No clubbing or cyanosis. Neurological: No motor or sensory deficits. Skin: No rashes or lesions.     Lab Results: Lab Results  Component Value Date   WBC 14.1 (H) 01/13/2021   HGB 9.3 (L) 01/13/2021   HCT 29.7 (L) 01/13/2021   MCV 76.9 (L) 01/13/2021   PLT 644 (H) 01/13/2021     Chemistry      Component Value Date/Time   NA 137 01/13/2021 0755   NA 139 12/25/2020 1236   K 4.3 01/13/2021 0755  CL 101 01/13/2021 0755   CO2 27 01/13/2021 0755   BUN 10 01/13/2021 0755   BUN 11 12/25/2020 1236   CREATININE 0.82 01/13/2021 0755   CREATININE 0.80 01/16/2016 1052      Component Value Date/Time   CALCIUM 11.3 (H) 01/13/2021 0755   ALKPHOS 85 01/13/2021 0755   AST 9 (L) 01/13/2021 0755   ALT 8 01/13/2021 0755   BILITOT 0.3 01/13/2021 0755        Impression and Plan:  70 year old with:  1. Stage IV clear-cell renal cell carcinoma diagnosed in December 2021.  He has hepatic and liver  involvement.   His disease status was updated that the man continues to be on salvage therapy utilizing ipilimumab and nivolumab.  Risks and benefits of continuing this treatment were discussed at this time.  Potential complications including immune mediated complications, dermatitis as well as GI toxicity were reviewed.  Plan is to repeat imaging studies after cycle 4 of therapy.  2.  Immune mediated complications: These complications include pneumonitis, colitis, thyroid disease and hepatitis among others were reviewed.  He has not experienced any at this time.   3.  IV access: Peripheral veins are currently in use without any complications.   4.  Antiemetics: Compazine is available to him without any nausea or vomiting.  5.  Goals of care and prognosis: His disease is incurable although aggressive measures are warranted.  6.  Polysubstance abuse: He has abstained from any use at this time.  7.  Anorexia: He is eating better and gained weight on Megace.  8.  Anemia: Multifactorial in nature related to malignancy and potentially iron deficiency.  Iron studies are currently pending from today.  We have talked about the oral replacement therapy and he is agreeable to proceed with that if his iron Cantu are low.  9.  Follow-up: He will return in 3 weeks for repeat follow-up.  30  minutes were dedicated to this visit.  Time was spent on reviewing disease status, reviewing treatment options and future plan of care discussion.    Chad Button, MD 2/21/20229:59 AM

## 2021-02-02 NOTE — Telephone Encounter (Signed)
-----   Message from [name], MD sent at 02/02/2021 11:37 AM EST ----- Please let him know his iron is low and will need iron supplement as we discussed. Rx sent to his pharmacy.

## 2021-02-02 NOTE — Telephone Encounter (Signed)
Called and informed patient of iron level and that a prescription for iron was sent into his pharmacy. Patient verbalized understanding. Instructed patient to call office with any questions or concerns.

## 2021-02-02 NOTE — Telephone Encounter (Signed)
Error

## 2021-02-02 NOTE — Addendum Note (Signed)
Addended by: [name] on: 02/02/2021 11:39 AM   Modules accepted: Orders

## 2021-02-03 ENCOUNTER — Inpatient Hospital Stay: Payer: Medicare Other

## 2021-02-03 ENCOUNTER — Other Ambulatory Visit: Payer: Medicare Other

## 2021-02-03 ENCOUNTER — Ambulatory Visit: Payer: Medicare Other | Admitting: Oncology

## 2021-02-03 ENCOUNTER — Telehealth: Payer: Self-pay | Admitting: Oncology

## 2021-02-03 VITALS — BP 108/63 | HR 96 | Temp 98.3°F | Resp 16

## 2021-02-03 DIAGNOSIS — C641 Malignant neoplasm of right kidney, except renal pelvis: Secondary | ICD-10-CM

## 2021-02-03 DIAGNOSIS — Z5112 Encounter for antineoplastic immunotherapy: Secondary | ICD-10-CM | POA: Diagnosis not present

## 2021-02-03 MED ORDER — SODIUM CHLORIDE 0.9 % IV SOLN
3.1500 mg/kg | Freq: Once | INTRAVENOUS | Status: AC
  Administered 2021-02-03: 200 mg via INTRAVENOUS
  Filled 2021-02-03: qty 20

## 2021-02-03 MED ORDER — FAMOTIDINE IN NACL 20-0.9 MG/50ML-% IV SOLN
20.0000 mg | Freq: Once | INTRAVENOUS | Status: AC
  Administered 2021-02-03: 20 mg via INTRAVENOUS

## 2021-02-03 MED ORDER — SODIUM CHLORIDE 0.9 % IV SOLN
Freq: Once | INTRAVENOUS | Status: AC
  Filled 2021-02-03: qty 250

## 2021-02-03 MED ORDER — SODIUM CHLORIDE 0.9 % IV SOLN
1.0000 mg/kg | Freq: Once | INTRAVENOUS | Status: AC
  Administered 2021-02-03: 65 mg via INTRAVENOUS
  Filled 2021-02-03: qty 13

## 2021-02-03 MED ORDER — DIPHENHYDRAMINE HCL 50 MG/ML IJ SOLN
INTRAMUSCULAR | Status: AC
  Filled 2021-02-03: qty 1

## 2021-02-03 MED ORDER — FAMOTIDINE IN NACL 20-0.9 MG/50ML-% IV SOLN
INTRAVENOUS | Status: AC
  Filled 2021-02-03: qty 50

## 2021-02-03 MED ORDER — DIPHENHYDRAMINE HCL 50 MG/ML IJ SOLN
25.0000 mg | Freq: Once | INTRAMUSCULAR | Status: AC
  Administered 2021-02-03: 25 mg via INTRAVENOUS

## 2021-02-03 NOTE — Patient Instructions (Signed)
[facility] Discharge Instructions for Patients Receiving Chemotherapy  Today you received the following chemotherapy agents Opdivo and Yervoy  To help prevent nausea and vomiting after your treatment, we encourage you to take your nausea medication as directed.    If you develop nausea and vomiting that is not controlled by your nausea medication, call the clinic.   BELOW ARE SYMPTOMS THAT SHOULD BE REPORTED IMMEDIATELY:  *FEVER GREATER THAN 100.5 F  *CHILLS WITH OR WITHOUT FEVER  NAUSEA AND VOMITING THAT IS NOT CONTROLLED WITH YOUR NAUSEA MEDICATION  *UNUSUAL SHORTNESS OF BREATH  *UNUSUAL BRUISING OR BLEEDING  TENDERNESS IN MOUTH AND THROAT WITH OR WITHOUT PRESENCE OF ULCERS  *URINARY PROBLEMS  *BOWEL PROBLEMS  UNUSUAL RASH Items with * indicate a potential emergency and should be followed up as soon as possible.  Feel free to call the clinic should you have any questions or concerns. The clinic phone number is [phone].  Please show the CHEMO ALERT CARD at check-in to the Emergency Department and triage nurse.   

## 2021-02-03 NOTE — Telephone Encounter (Signed)
Scheduled per 02/21 los, patient has been called and notified. °

## 2021-02-03 NOTE — Progress Notes (Signed)
Nutrition Assessment:  70 year old male with stage IV renal cell carcinoma with mets to hepatic and pulmonary.  Past medical history Etoh use, HLD. Patient followed by Dr Alen Blew.  Met with patient during infusion.  Patient reports that appetite has improved with megace.  Eats cereal for breakfast and then eats supper (chicken noodle soup, shrimp with pasta).  Drinks protein drinks (ensure/boost), tomato juice and Dr Malachi Bonds.      Medications: megace  Labs: reviewed  Anthropometrics:   Height: 66 inches Weight: 131 lb 4.8 oz 2/21 12/29 160 lb BMI: 21  18% weight loss in the last 2 months, sigificant   Estimated Energy Needs  Kcals: 1500-1800 Protein: 75-90 g Fluid: > 1.5 L  NUTRITION DIAGNOSIS: Inadequate oral intake related to cancer and cancer related treatment side effects as evidenced by 18% weight loss in the last 2 months and poor po intake   INTERVENTION:  Discussed ways to increase calories and protein. Handout provided Encouraged higher calorie shakes (350 calories or higher) Provided contact information    MONITORING, EVALUATION, GOAL: weight trends, intake   NEXT VISIT: Wednesday, March 16 during infusion  Franshesca Chipman B. Zenia Resides, [place], [place] Registered Dietitian [phone] (mobile)

## 2021-02-03 NOTE — Progress Notes (Signed)
Yervoy checklist completed and patient stated no to all questions and that he is having regular bowel movements.

## 2021-02-18 ENCOUNTER — Inpatient Hospital Stay (HOSPITAL_COMMUNITY)
Admission: EM | Admit: 2021-02-18 | Discharge: 2021-02-20 | DRG: 687 | Disposition: A | Discharge status: Home / Self Care | Payer: Medicare Other | Attending: Internal Medicine | Admitting: Internal Medicine

## 2021-02-18 ENCOUNTER — Emergency Department (HOSPITAL_COMMUNITY): Payer: Medicare Other

## 2021-02-18 ENCOUNTER — Encounter (HOSPITAL_COMMUNITY): Payer: Self-pay | Admitting: *Deleted

## 2021-02-18 ENCOUNTER — Telehealth: Payer: Self-pay

## 2021-02-18 ENCOUNTER — Other Ambulatory Visit: Payer: Self-pay

## 2021-02-18 DIAGNOSIS — C7800 Secondary malignant neoplasm of unspecified lung: Secondary | ICD-10-CM | POA: Diagnosis present

## 2021-02-18 DIAGNOSIS — F1410 Cocaine abuse, uncomplicated: Secondary | ICD-10-CM | POA: Diagnosis present

## 2021-02-18 DIAGNOSIS — E119 Type 2 diabetes mellitus without complications: Secondary | ICD-10-CM | POA: Diagnosis present

## 2021-02-18 DIAGNOSIS — Z79899 Other long term (current) drug therapy: Secondary | ICD-10-CM

## 2021-02-18 DIAGNOSIS — N400 Enlarged prostate without lower urinary tract symptoms: Secondary | ICD-10-CM | POA: Diagnosis present

## 2021-02-18 DIAGNOSIS — I10 Essential (primary) hypertension: Secondary | ICD-10-CM | POA: Diagnosis present

## 2021-02-18 DIAGNOSIS — N1330 Unspecified hydronephrosis: Secondary | ICD-10-CM | POA: Diagnosis present

## 2021-02-18 DIAGNOSIS — Z87891 Personal history of nicotine dependence: Secondary | ICD-10-CM

## 2021-02-18 DIAGNOSIS — C642 Malignant neoplasm of left kidney, except renal pelvis: Principal | ICD-10-CM | POA: Diagnosis present

## 2021-02-18 DIAGNOSIS — D72829 Elevated white blood cell count, unspecified: Secondary | ICD-10-CM | POA: Diagnosis present

## 2021-02-18 DIAGNOSIS — Z833 Family history of diabetes mellitus: Secondary | ICD-10-CM

## 2021-02-18 DIAGNOSIS — D630 Anemia in neoplastic disease: Secondary | ICD-10-CM | POA: Diagnosis present

## 2021-02-18 DIAGNOSIS — R42 Dizziness and giddiness: Secondary | ICD-10-CM | POA: Diagnosis present

## 2021-02-18 DIAGNOSIS — C787 Secondary malignant neoplasm of liver and intrahepatic bile duct: Secondary | ICD-10-CM | POA: Diagnosis present

## 2021-02-18 DIAGNOSIS — Z9221 Personal history of antineoplastic chemotherapy: Secondary | ICD-10-CM

## 2021-02-18 DIAGNOSIS — R509 Fever, unspecified: Secondary | ICD-10-CM | POA: Diagnosis present

## 2021-02-18 DIAGNOSIS — Z7984 Long term (current) use of oral hypoglycemic drugs: Secondary | ICD-10-CM

## 2021-02-18 DIAGNOSIS — R5383 Other fatigue: Secondary | ICD-10-CM | POA: Diagnosis present

## 2021-02-18 DIAGNOSIS — D649 Anemia, unspecified: Secondary | ICD-10-CM | POA: Diagnosis present

## 2021-02-18 DIAGNOSIS — E785 Hyperlipidemia, unspecified: Secondary | ICD-10-CM | POA: Diagnosis present

## 2021-02-18 DIAGNOSIS — R6510 Systemic inflammatory response syndrome (SIRS) of non-infectious origin without acute organ dysfunction: Secondary | ICD-10-CM | POA: Diagnosis present

## 2021-02-18 DIAGNOSIS — R1032 Left lower quadrant pain: Secondary | ICD-10-CM | POA: Diagnosis present

## 2021-02-18 DIAGNOSIS — C649 Malignant neoplasm of unspecified kidney, except renal pelvis: Secondary | ICD-10-CM | POA: Diagnosis present

## 2021-02-18 DIAGNOSIS — R413 Other amnesia: Secondary | ICD-10-CM | POA: Diagnosis present

## 2021-02-18 DIAGNOSIS — F1020 Alcohol dependence, uncomplicated: Secondary | ICD-10-CM | POA: Diagnosis present

## 2021-02-18 DIAGNOSIS — T451X5A Adverse effect of antineoplastic and immunosuppressive drugs, initial encounter: Secondary | ICD-10-CM | POA: Diagnosis present

## 2021-02-18 DIAGNOSIS — Z20822 Contact with and (suspected) exposure to covid-19: Secondary | ICD-10-CM | POA: Diagnosis present

## 2021-02-18 HISTORY — DX: Malignant (primary) neoplasm, unspecified: C80.1

## 2021-02-18 LAB — COMPREHENSIVE METABOLIC PANEL
ALT: 16 U/L (ref 0–44)
AST: 15 U/L (ref 15–41)
Albumin: 2.3 g/dL — ABNORMAL LOW (ref 3.5–5.0)
Alkaline Phosphatase: 108 U/L (ref 38–126)
Anion gap: 10 (ref 5–15)
BUN: 13 mg/dL (ref 8–23)
CO2: 20 mmol/L — ABNORMAL LOW (ref 22–32)
Calcium: 9 mg/dL (ref 8.9–10.3)
Chloride: 109 mmol/L (ref 98–111)
Creatinine, Ser: 1.03 mg/dL (ref 0.61–1.24)
GFR, Estimated: >60 mL/min (ref >60)
Glucose, Bld: 108 mg/dL — ABNORMAL HIGH (ref 70–99)
Potassium: 4.1 mmol/L (ref 3.5–5.1)
Sodium: 139 mmol/L (ref 135–145)
Total Bilirubin: 0.3 mg/dL (ref 0.3–1.2)
Total Protein: 8.3 g/dL — ABNORMAL HIGH (ref 6.5–8.1)

## 2021-02-18 LAB — CBC WITH DIFFERENTIAL/PLATELET
Abs Immature Granulocytes: 0.14 10*3/uL — ABNORMAL HIGH (ref 0.00–0.07)
Basophils Absolute: 0.1 10*3/uL (ref 0.0–0.1)
Basophils Relative: 1 %
Eosinophils Absolute: 0.1 10*3/uL (ref 0.0–0.5)
Eosinophils Relative: 1 %
HCT: 23.5 % — ABNORMAL LOW (ref 39.0–52.0)
Hemoglobin: 7 g/dL — ABNORMAL LOW (ref 13.0–17.0)
Immature Granulocytes: 1 %
Lymphocytes Relative: 19 %
Lymphs Abs: 2.8 10*3/uL (ref 0.7–4.0)
MCH: 22.5 pg — ABNORMAL LOW (ref 26.0–34.0)
MCHC: 29.8 g/dL — ABNORMAL LOW (ref 30.0–36.0)
MCV: 75.6 fL — ABNORMAL LOW (ref 80.0–100.0)
Monocytes Absolute: 2.3 10*3/uL — ABNORMAL HIGH (ref 0.1–1.0)
Monocytes Relative: 15 %
Neutro Abs: 9.6 10*3/uL — ABNORMAL HIGH (ref 1.7–7.7)
Neutrophils Relative %: 63 %
Platelets: 721 10*3/uL — ABNORMAL HIGH (ref 150–400)
RBC: 3.11 MIL/uL — ABNORMAL LOW (ref 4.22–5.81)
RDW: 18.3 % — ABNORMAL HIGH (ref 11.5–15.5)
WBC: 15.1 10*3/uL — ABNORMAL HIGH (ref 4.0–10.5)
nRBC: 0 % (ref 0.0–0.2)

## 2021-02-18 LAB — APTT: aPTT: 47 seconds — ABNORMAL HIGH (ref 24–36)

## 2021-02-18 LAB — URINALYSIS, ROUTINE W REFLEX MICROSCOPIC
Bacteria, UA: NONE SEEN
Bilirubin Urine: NEGATIVE
Glucose, UA: NEGATIVE mg/dL
Hgb urine dipstick: NEGATIVE
Ketones, ur: NEGATIVE mg/dL
Leukocytes,Ua: NEGATIVE
Nitrite: NEGATIVE
Protein, ur: 30 mg/dL — AB
Specific Gravity, Urine: 1.028 (ref 1.005–1.030)
pH: 5 (ref 5.0–8.0)

## 2021-02-18 LAB — PROTIME-INR
INR: 1.3 — ABNORMAL HIGH (ref 0.8–1.2)
Prothrombin Time: 15.7 seconds — ABNORMAL HIGH (ref 11.4–15.2)

## 2021-02-18 LAB — POC OCCULT BLOOD, ED: Fecal Occult Bld: NEGATIVE

## 2021-02-18 LAB — LACTIC ACID, PLASMA: Lactic Acid, Venous: 1.9 mmol/L (ref 0.5–1.9)

## 2021-02-18 LAB — PREPARE RBC (CROSSMATCH)

## 2021-02-18 MED ORDER — FERROUS SULFATE 325 (65 FE) MG PO TABS
325.0000 mg | ORAL_TABLET | Freq: Every day | ORAL | Status: DC
  Administered 2021-02-19 – 2021-02-20 (×2): 325 mg via ORAL
  Filled 2021-02-18 (×2): qty 1

## 2021-02-18 MED ORDER — MORPHINE SULFATE (PF) 4 MG/ML IV SOLN
4.0000 mg | Freq: Once | INTRAVENOUS | Status: AC
  Administered 2021-02-18: 4 mg via INTRAVENOUS
  Filled 2021-02-18: qty 1

## 2021-02-18 MED ORDER — SODIUM CHLORIDE 0.9 % IV SOLN
2.0000 g | Freq: Once | INTRAVENOUS | Status: AC
  Administered 2021-02-18: 2 g via INTRAVENOUS
  Filled 2021-02-18: qty 20

## 2021-02-18 MED ORDER — ENOXAPARIN SODIUM 40 MG/0.4ML ~~LOC~~ SOLN
40.0000 mg | SUBCUTANEOUS | Status: DC
  Administered 2021-02-19 – 2021-02-20 (×2): 40 mg via SUBCUTANEOUS
  Filled 2021-02-18 (×2): qty 0.4

## 2021-02-18 MED ORDER — ACETAMINOPHEN 500 MG PO TABS
1000.0000 mg | ORAL_TABLET | Freq: Once | ORAL | Status: AC
  Administered 2021-02-18: 1000 mg via ORAL
  Filled 2021-02-18: qty 2

## 2021-02-18 MED ORDER — ACETAMINOPHEN 650 MG RE SUPP: 650.0000 mg | Freq: Four times a day (QID) | RECTAL | Status: DC | PRN

## 2021-02-18 MED ORDER — SODIUM CHLORIDE 0.9% FLUSH
3.0000 mL | Freq: Two times a day (BID) | INTRAVENOUS | Status: DC
  Administered 2021-02-19 (×2): 3 mL via INTRAVENOUS

## 2021-02-18 MED ORDER — LACTATED RINGERS IV BOLUS (SEPSIS)
1000.0000 mL | Freq: Once | INTRAVENOUS | Status: AC
  Administered 2021-02-18: 1000 mL via INTRAVENOUS

## 2021-02-18 MED ORDER — INSULIN ASPART 100 UNIT/ML ~~LOC~~ SOLN
0.0000 [IU] | Freq: Three times a day (TID) | SUBCUTANEOUS | Status: DC
  Administered 2021-02-19: 2 [IU] via SUBCUTANEOUS
  Filled 2021-02-18: qty 0.09

## 2021-02-18 MED ORDER — IOHEXOL 300 MG/ML  SOLN
100.0000 mL | Freq: Once | INTRAMUSCULAR | Status: AC | PRN
  Administered 2021-02-18: 100 mL via INTRAVENOUS

## 2021-02-18 MED ORDER — ATORVASTATIN CALCIUM 20 MG PO TABS
20.0000 mg | ORAL_TABLET | Freq: Every day | ORAL | Status: DC
  Administered 2021-02-19 – 2021-02-20 (×2): 20 mg via ORAL
  Filled 2021-02-18: qty 2
  Filled 2021-02-18: qty 1

## 2021-02-18 MED ORDER — ACETAMINOPHEN 325 MG PO TABS
650.0000 mg | ORAL_TABLET | Freq: Four times a day (QID) | ORAL | Status: DC | PRN
  Administered 2021-02-19: 650 mg via ORAL
  Filled 2021-02-18: qty 2

## 2021-02-18 MED ORDER — ONDANSETRON HCL 4 MG PO TABS: 4.0000 mg | ORAL_TABLET | Freq: Every day | ORAL | Status: DC | PRN

## 2021-02-18 MED ORDER — METRONIDAZOLE IN NACL 5-0.79 MG/ML-% IV SOLN
500.0000 mg | Freq: Once | INTRAVENOUS | Status: AC
  Administered 2021-02-18: 500 mg via INTRAVENOUS
  Filled 2021-02-18: qty 100

## 2021-02-18 MED ORDER — SODIUM CHLORIDE 0.9 % IV SOLN
10.0000 mL/h | Freq: Once | INTRAVENOUS | Status: AC
  Administered 2021-02-19: 10 mL/h via INTRAVENOUS

## 2021-02-18 MED ORDER — MEGESTROL ACETATE 400 MG/10ML PO SUSP
400.0000 mg | Freq: Every day | ORAL | Status: DC
  Administered 2021-02-19 – 2021-02-20 (×2): 400 mg via ORAL
  Filled 2021-02-18 (×2): qty 10

## 2021-02-18 NOTE — Telephone Encounter (Signed)
Received call from patient's wife [name] reporting patient has been have bad diarrhea with increased dizziness. Advised spouse per Dr. Alen Blew to take patient to the ED and she was agreeable.

## 2021-02-18 NOTE — ED Notes (Signed)
Pt rectal temp is 100.5, provider made aware

## 2021-02-18 NOTE — ED Notes (Signed)
Rn introduced self to patient and oriented him to call bell. Patient denies needs at this time. Explained to patient that we are awaiting blood for transfusion. Patient reports understanding. Patient knows we are waiting for a bed upstairs.

## 2021-02-18 NOTE — ED Provider Notes (Signed)
[facility] DEPT Provider Note   CSN: [id number] Arrival date & time: 02/18/21  1702     History Chief Complaint  Patient presents with  . Dizziness  . Abdominal Pain    Chad Cantu  is a 70 y.o. male.  The history is provided by the patient, the spouse and medical records. No language interpreter was used.  Dizziness Abdominal Pain    70 year old male significant history of renal cell carcinoma with metastasis diagnosed in December, diabetes, alcohol use, hypertension, hyperlipidemia, presenting complaining of dizziness and abdominal pain.  Patient report for the past week Chad Cantu has been having bouts of dizziness in which Chad Cantu described as spinning sensation in his head, waxing waning brought on with positional change and improves at rest.  Yesterday Chad Cantu developed fever of 100.3 on a home thermometer, also complaining of several bouts of diarrhea as well as having pain to his left side of the abdomen.  Chad Cantu also endorsed generalized weakness.  Chad Cantu has been drinking plenty of fluid and eating her symptoms persist.  Chad Cantu is currently undergoing chemotherapy for his cancer.  Chad Cantu is up-to-date with COVID vaccination.  Chad Cantu denies any chest pain or shortness of breath, no cough no dysuria and no rash.  Endorse occasional mild headache with his dizziness  Past Medical History:  Diagnosis Date  . Diabetes mellitus without complication ([place])   . ETOH abuse   . Hyperlipidemia   . Hypertension   . Substance abuse ([place])    ETOH, cocaine.  Remission    Patient Active Problem List   Diagnosis Date Noted  . History of recent fall 12/25/2020  . Hypercalcemia 12/22/2020  . Metastatic renal cell carcinoma ([place]) 12/09/2020  . Goals of care, counseling/discussion 12/09/2020  . AKI (acute kidney injury) ([place]) 12/09/2020  . Leukocytosis 12/09/2020  . Alcohol dependence with uncomplicated withdrawal ([place]) 11/16/2020  . Near syncope 09/20/2020  . Dehydration 09/20/2020  .  Alcohol abuse 09/20/2020  . Alcohol use disorder, severe, dependence ([place])   . Memory difficulty 05/02/2017  . Rash, skin 05/02/2017  . Overactive bladder 05/02/2017  . Alcohol use disorder 05/01/2017  . HLD (hyperlipidemia) 04/09/2015  . Essential hypertension 04/08/2015  . Tobacco abuse 04/08/2015    Past Surgical History:  Procedure Laterality Date  . COLONOSCOPY  5 years ago?   at [facility] maybe per pt,?unsure results  . LAPAROSCOPIC GASTROTOMY W/ REPAIR OF ULCER         Family History  Problem Relation Age of Onset  . Diabetes Mother   . Colon cancer Neg Hx   . Colon polyps Neg Hx   . Esophageal cancer Neg Hx   . Rectal cancer Neg Hx   . Stomach cancer Neg Hx     Social History   Tobacco Use  . Smoking status: Former Smoker    Years: 55.00    Types: Cigarettes  . Smokeless tobacco: Never Used  Vaping Use  . Vaping Use: Never used  Substance Use Topics  . Alcohol use: Yes    Alcohol/week: 12.0 standard drinks    Types: 12 Cans of beer per week  . Drug use: Yes    Types: Marijuana    Comment: marijuana maybe 2 months ago, remote h/o cocaine use    Home Medications Prior to Admission medications   Medication Sig Start Date End Date Taking? Authorizing Provider  acetaminophen (TYLENOL) 325 MG tablet Take 2 tablets (650 mg total) by mouth every 6 (six) hours as needed for mild  pain (or Fever >/= 101). 11/22/20   Domenic Polite, MD  Ascorbic Acid (VITAMIN C PO) Take by mouth.    [provider]  atorvastatin (LIPITOR) 20 MG tablet Take 1 tablet (20 mg total) by mouth daily. 06/05/20   [name], MD  ferrous sulfate 325 (65 FE) MG EC tablet Take 1 tablet (325 mg total) by mouth in the morning and at bedtime. 02/02/21   [name], MD  megestrol (MEGACE) 40 MG/ML suspension Take 10 mLs (400 mg total) by mouth daily. 02/02/21   [name], MD  metFORMIN (GLUCOPHAGE) 500 MG tablet Take 1 tablet (500 mg total) by mouth daily with breakfast.  06/05/20   [name], MD  ondansetron (ZOFRAN) 4 MG tablet Take 1 tablet (4 mg total) by mouth daily as needed for nausea or vomiting. 12/10/20 12/10/21  Pokhrel, Corrie Mckusick, MD  prochlorperazine (COMPAZINE) 10 MG tablet Take 1 tablet (10 mg total) by mouth every 6 (six) hours as needed for nausea or vomiting. 12/09/20   [name], MD  lisinopril (ZESTRIL) 20 MG tablet Take 20 mg by mouth daily. 03/26/20 09/20/20  [provider]    Allergies    Patient has no known allergies.  Review of Systems   Review of Systems  Gastrointestinal: Positive for abdominal pain.  Neurological: Positive for dizziness.  All other systems reviewed and are negative.   Physical Exam Updated Vital Signs BP 100/65 (BP Location: Left Arm)   Pulse (!) 126   Temp 98.4 F (36.9 C) (Oral)   Resp 20   SpO2 100%   Physical Exam Vitals and nursing note reviewed.  Constitutional:      General: Chad Cantu is not in acute distress.    Appearance: Chad Cantu is well-developed and well-nourished.  HENT:     Head: Normocephalic and atraumatic.     Comments: Normal-appearing TMs bilaterally without any evidence of infection or cerumen impaction Eyes:     Extraocular Movements: Extraocular movements intact.     Conjunctiva/sclera: Conjunctivae normal.     Pupils: Pupils are equal, round, and reactive to light.  Cardiovascular:     Rate and Rhythm: Tachycardia present.     Heart sounds: Normal heart sounds.  Pulmonary:     Effort: Pulmonary effort is normal.     Breath sounds: Normal breath sounds. No wheezing, rhonchi or rales.  Abdominal:     General: Abdomen is flat. Bowel sounds are normal.     Palpations: Abdomen is soft.     Tenderness: There is abdominal tenderness in the left upper quadrant. There is no guarding or rebound. Negative signs include Murphy's sign, Rovsing's sign and McBurney's sign.     Hernia: No hernia is present.  Musculoskeletal:     Cervical back: Neck supple.  Skin:     Findings: No rash.  Neurological:     Mental Status: Chad Cantu is alert and oriented to person, place, and time.     GCS: GCS eye subscore is 4. GCS verbal subscore is 5. GCS motor subscore is 6.     Cranial Nerves: Cranial nerves are intact.     Sensory: Sensation is intact.     Motor: Motor function is intact. No weakness.     Coordination: Coordination is intact.     Gait: Gait is intact.  Psychiatric:        Mood and Affect: Mood and affect and mood normal.     ED Results / Procedures / Treatments  Labs (all labs ordered are listed, but only abnormal results are displayed) Labs Reviewed  COMPREHENSIVE METABOLIC PANEL - Abnormal; Notable for the following components:      Result Value   CO2 20 (*)    Glucose, Bld 108 (*)    Total Protein 8.3 (*)    Albumin 2.3 (*)    All other components within normal limits  CBC WITH DIFFERENTIAL/PLATELET - Abnormal; Notable for the following components:   WBC 15.1 (*)    RBC 3.11 (*)    Hemoglobin 7.0 (*)    HCT 23.5 (*)    MCV 75.6 (*)    MCH 22.5 (*)    MCHC 29.8 (*)    RDW 18.3 (*)    Platelets 721 (*)    Neutro Abs 9.6 (*)    Monocytes Absolute 2.3 (*)    Abs Immature Granulocytes 0.14 (*)    All other components within normal limits  PROTIME-INR - Abnormal; Notable for the following components:   Prothrombin Time 15.7 (*)    INR 1.3 (*)    All other components within normal limits  APTT - Abnormal; Notable for the following components:   aPTT 47 (*)    All other components within normal limits  URINALYSIS, ROUTINE W REFLEX MICROSCOPIC - Abnormal; Notable for the following components:   Protein, ur 30 (*)    All other components within normal limits  CULTURE, BLOOD (SINGLE)  URINE CULTURE  SARS CORONAVIRUS 2 (TAT 6-24 HRS)  LACTIC ACID, PLASMA  POC OCCULT BLOOD, ED    EKG None ED ECG REPORT   Date: 02/18/2021  Rate: 83  Rhythm: normal sinus rhythm  QRS Axis: normal  Intervals: normal  ST/T Wave abnormalities:  nonspecific T wave changes  Conduction Disutrbances:none  Narrative Interpretation:   Old EKG Reviewed: unchanged  I have personally reviewed the EKG tracing and agree with the computerized printout as noted.   Radiology CT Head Wo Contrast  Result Date: 02/18/2021 CLINICAL DATA:  Dizziness and headache. EXAM: CT HEAD WITHOUT CONTRAST TECHNIQUE: Contiguous axial images were obtained from the base of the skull through the vertex without intravenous contrast. COMPARISON:  December 09, 2020 FINDINGS: Brain: There is mild cerebral atrophy with widening of the extra-axial spaces and ventricular dilatation. There are areas of decreased attenuation within the white matter tracts of the supratentorial brain, consistent with microvascular disease changes. Vascular: No hyperdense vessel or unexpected calcification. Skull: Normal. Negative for fracture or focal lesion. Sinuses/Orbits: No acute finding. Other: Small, ill-defined areas of partially calcified and noncalcified soft tissue attenuation are seen scattered throughout the scalp. This is seen on the prior exam. A 1.9 cm x 0.3 cm right frontal scalp lipoma is seen. IMPRESSION: 1. No acute intracranial findings. 2. Small right frontal scalp lipoma. Electronically Signed   By: Virgina Norfolk M.D.   On: 02/18/2021 19:48   CT ABDOMEN PELVIS W CONTRAST  Result Date: 02/18/2021 CLINICAL DATA:  Left lower quadrant pain. EXAM: CT ABDOMEN AND PELVIS WITH CONTRAST TECHNIQUE: Multidetector CT imaging of the abdomen and pelvis was performed using the standard protocol following bolus administration of intravenous contrast. CONTRAST:  121mL OMNIPAQUE IOHEXOL 300 MG/ML  SOLN COMPARISON:  November 19, 2020 FINDINGS: Lower chest: No acute abnormality. Hepatobiliary: A stable 1.3 cm x 0.8 cm focus of parenchymal low attenuation is seen within the posterior aspect of the right lobe of the liver. Ill-defined 0.8 cm x 0.6 cm and 0.8 cm x 0.7 cm hyperdense foci are also  seen  within this region. Additional 1.9 cm x 1.2 cm and 1.6 cm x 1.5 cm hyperdense lesions are noted within the right lobe (axial CT images 14 and 17, CT series number 2). A 3.3 cm x 3.0 cm heterogeneous low-attenuation liver mass, with surrounding hyperdense rim, is seen adjacent to the caudate lobe (axial CT image 26, CT series number 2). No gallstones, gallbladder wall thickening, or biliary dilatation. Pancreas: Unremarkable. No pancreatic ductal dilatation or surrounding inflammatory changes. Spleen: Normal in size without focal abnormality. Adrenals/Urinary Tract: Adrenal glands are unremarkable. The right kidney is normal in size, without renal calculi, focal lesion, or hydronephrosis. Delayed renal cortical enhancement is seen on the left with mild left-sided perinephric inflammatory fat stranding. A 6.4 cm x 4.9 cm x 4.4 cm heterogeneous low-attenuation mass is seen within the lower pole of the left kidney. This is increased in size when compared to the prior study. An adjacent 5.5 cm x 3.9 cm x 3.6 cm area of heterogeneous attenuation is seen along the lower pole of the left kidney. There is marked severity left-sided hydronephrosis and proximal hydroureter. The lumen of the proximal left ureter is filled with an area of heterogeneous attenuation which extends inferiorly into the previously described area along the lower pole. Stomach/Bowel: Stomach is within normal limits. Appendix appears normal. No evidence of bowel wall thickening, distention, or inflammatory changes. Vascular/Lymphatic: Aortic atherosclerosis. No enlarged abdominal or pelvic lymph nodes. Reproductive: There is moderate to marked severity prostate gland enlargement. Other: No abdominal wall hernia or abnormality. No abdominopelvic ascites. Musculoskeletal: There is 2 mm to 3 mm retrolisthesis of the L5 vertebral body on S1. IMPRESSION: 1. Large mass within the lower pole of the left kidney with an adjacent chronic perinephric hematoma. 2.  Extrarenal extension of the previously noted left renal mass with invasion into the proximal left ureter and subsequent partial obstruction of the left kidney. Further evaluation with MRI is recommended. 3. Findings consistent with multiple metastatic lesions within the liver. 4. Enlarged prostate gland. 5. 2 mm to 3 mm retrolisthesis of the L5 vertebral body on S1. 6. Aortic atherosclerosis. Aortic Atherosclerosis (ICD10-I70.0). Electronically Signed   By: Virgina Norfolk M.D.   On: 02/18/2021 20:27   DG Chest Port 1 View  Result Date: 02/18/2021 CLINICAL DATA:  Question sepsis. EXAM: PORTABLE CHEST 1 VIEW COMPARISON:  11/18/2020 FINDINGS: The heart size and mediastinal contours are within normal limits. Both lungs are clear. The visualized skeletal structures are unremarkable. IMPRESSION: No active disease. Electronically Signed   By: Franchot Gallo M.D.   On: 02/18/2021 18:51    Procedures .Critical Care Performed by: Domenic Moras, PA-C Authorized by: Domenic Moras, PA-C   Critical care provider statement:    Critical care time (minutes):  38   Critical care was time spent personally by me on the following activities:  Discussions with consultants, evaluation of patient's response to treatment, examination of patient, ordering and performing treatments and interventions, ordering and review of laboratory studies, ordering and review of radiographic studies, pulse oximetry, re-evaluation of patient's condition, obtaining history from patient or surrogate and review of old charts     Medications Ordered in ED Medications  lactated ringers bolus 1,000 mL (0 mLs Intravenous Stopped 02/18/21 2007)  cefTRIAXone (ROCEPHIN) 2 g in sodium chloride 0.9 % 100 mL IVPB (0 g Intravenous Stopped 02/18/21 2044)    And  metroNIDAZOLE (FLAGYL) IVPB 500 mg (0 mg Intravenous Stopped 02/18/21 2144)  iohexol (OMNIPAQUE) 300 MG/ML solution 100  mL (100 mLs Intravenous Contrast Given 02/18/21 1937)  morphine 4 MG/ML  injection 4 mg (4 mg Intravenous Given 02/18/21 2145)  acetaminophen (TYLENOL) tablet 1,000 mg (1,000 mg Oral Given 02/18/21 2144)    ED Course  I have reviewed the triage vital signs and the nursing notes.  Pertinent labs & imaging results that were available during my care of the patient were reviewed by me and considered in my medical decision making (see chart for details).    MDM Rules/Calculators/A&P                          BP 114/78   Pulse 87   Temp (!) 100.5 F (38.1 C) (Rectal)   Resp (!) 27   SpO2 100%   Final Clinical Impression(s) / ED Diagnoses Final diagnoses:  Symptomatic anemia  Renal cell cancer, left (HCC)  SIRS (systemic inflammatory response syndrome) ([place])    Rx / DC Orders ED Discharge Orders    None     6:08 PM Patient here with complaints of dizziness for the past week described as head spinning sensation brought on with positional change.  Now Chad Cantu is endorsing having abdominal pain as well as diarrhea and fever for the past 1 to 2 days.  On presentation Chad Cantu is overall well-appearing however initial blood pressure is 100/65 and Chad Cantu is tachycardic with a heart rate of 126.  Oral temperature is 98.4 and no hypoxia.  I am concerned for potential Sirs/sepsis given fever abdominal pain and abnormal vital sign.  Code sepsis initiated, will give IV fluid, antibiotics, will obtain abdominal pelvic CT scan for further evaluation.  Dizziness is likely peripheral as it is worsening with positional change improved at rest.  Chad Cantu does not have any focal neuro deficit on initial exam.  Screening head CT ordered since patient does have history of renal cell carcinoma with mets.  Chad Cantu is currently on chemotherapy which may contribute to some of the symptoms if we do not find any obvious infectious source.  10:09 PM Work-up is negative for any obvious infectious source.  Blood cultures pending, Covid test pending however patient denies any Covid symptoms.  Patient has normal lactic  acid therefore will not give fluid resuscitation at 30 mL/kg.  Patient does have a white count of 15.1, does have a rectal temperature of 100.5 but his blood pressure did improve with IV fluid.  Currently his hemoglobin is 7, Hemoccult negative, suspect anemia secondary to ongoing cancer as well as chronic renal hematoma.  Head CT scan unremarkable chest x-ray unremarkable.  CT scan shows large mass within the left kidney along with multiple metastatic lesions within the liver.  I discussed this finding with patient  Appreciate consultation from [facility] hospitalist, Dr. Trilby Drummer, who requested admit patient for symptomatic anemia.  Will transfuse 1 unit of blood.  Sepsis reassessment done.     Domenic Moras, PA-C 02/18/21 2213    Truddie Hidden, MD 02/20/21 1455

## 2021-02-18 NOTE — H&P (Signed)
History and Physical   Chad Cantu DGU:[id number] DOB: 25-Dec-1950 DOA: 02/18/2021  PCP: [name], MD   Patient coming from: Home  Chief Complaint: Dizziness, left lower quadrant pain  HPI: Chad Cantu is a 70 y.o. male with medical history significant of alcohol use, hypertension, hyperlipidemia, memory deficit, diabetes, metastatic renal cell carcinoma who presents with dizziness and left lower quadrant pain for 1 week.  As above he has been having symptoms for about 1 week.  He notes that he has dizziness that is worse with position change including from sitting to standing.  He also reports left lower quadrant pain and some diarrhea as well as increased weakness and fatigue.  He describes the diarrhea as a single episode of loose stool not multiple episodes.  He has not had any since yesterday. He states his abdominal pain is resolved when seen.  He has had intermittent fevers since starting chemo treatment in December. He denies chest pain.  Reports fatigue and increased feelings of weakness as above.  ED Course: Vital signs in the ED significant for blood pressure in the 956L systolic this appears be around his baseline, pulse initially tachycardic but this improved with IV fluids.  Did note to have rectal temperature of 100.5.  CMP showed bicarb of 20 which is stable, protein 8.3 which is stable, albumin 2.3 which is stable.  CBC showed leukocytosis to 15 which is stable for the past month, platelets of 721 which is somewhat higher than he had recently but has been in the 600s, also worsening anemia at 7.0 down from last of 8 2 weeks ago.  PT PTT and INR elevated to 15.7, 47, 1.3 respectively.  Lactic acid normal.  FOBT negative.  Respiratory panel flu COVID pending.  Urinalysis showed only 30 protein.  Urine culture and blood cultures pending.  Chest x-ray showed no acute abnormalities, CT head showed no acute abnormalities, CT abdomen pelvis showed large mass in the lower pole of  his left kidney which is known also showed apparent extension with protrusion into the left ureter.  Also shown where known liver mets.  Also shown was hyperplastic prostate and retrolisthesis of L5 on S1.  Patient received 1 L bolus, ceftriaxone, Flagyl, morphine in the ED.  Also 1 unit packed red blood cells have been ordered.  Review of Systems: As per HPI otherwise all other systems reviewed and are negative.  Past Medical History:  Diagnosis Date   Cancer ([place])    Diabetes mellitus without complication ([place])    ETOH abuse    Hyperlipidemia    Hypertension    Substance abuse ([place])    ETOH, cocaine.  Remission    Past Surgical History:  Procedure Laterality Date   COLONOSCOPY  5 years ago?   at [facility] maybe per pt,?unsure results   LAPAROSCOPIC GASTROTOMY W/ REPAIR OF ULCER      Social History  reports that he has quit smoking. His smoking use included cigarettes. He quit after 55.00 years of use. He has never used smokeless tobacco. He reports current alcohol use of about 12.0 standard drinks of alcohol per week. He reports current drug use. Drug: Marijuana.  No Known Allergies  Family History  Problem Relation Age of Onset   Diabetes Mother    Colon cancer Neg Hx    Colon polyps Neg Hx    Esophageal cancer Neg Hx    Rectal cancer Neg Hx    Stomach cancer Neg Hx   Reviewed on admission  Prior to Admission medications   Medication Sig Start Date End Date Taking? Authorizing Provider  acetaminophen (TYLENOL) 325 MG tablet Take 2 tablets (650 mg total) by mouth every 6 (six) hours as needed for mild pain (or Fever >/= 101). 11/22/20   Domenic Polite, MD  Ascorbic Acid (VITAMIN C PO) Take by mouth.    [provider]  atorvastatin (LIPITOR) 20 MG tablet Take 1 tablet (20 mg total) by mouth daily. 06/05/20   [name], MD  ferrous sulfate 325 (65 FE) MG EC tablet Take 1 tablet (325 mg total) by mouth in the morning and at bedtime. 02/02/21    [name], MD  megestrol (MEGACE) 40 MG/ML suspension Take 10 mLs (400 mg total) by mouth daily. 02/02/21   [name], MD  metFORMIN (GLUCOPHAGE) 500 MG tablet Take 1 tablet (500 mg total) by mouth daily with breakfast. 06/05/20   [name], MD  ondansetron (ZOFRAN) 4 MG tablet Take 1 tablet (4 mg total) by mouth daily as needed for nausea or vomiting. 12/10/20 12/10/21  Pokhrel, Corrie Mckusick, MD  prochlorperazine (COMPAZINE) 10 MG tablet Take 1 tablet (10 mg total) by mouth every 6 (six) hours as needed for nausea or vomiting. 12/09/20   [name], MD  lisinopril (ZESTRIL) 20 MG tablet Take 20 mg by mouth daily. 03/26/20 09/20/20  [provider]    Physical Exam: Vitals:   02/18/21 2100 02/18/21 2130 02/18/21 2215 02/18/21 2230  BP: 119/82 114/78  109/74  Pulse: 83 87 86 83  Resp: 16 (!) 27 19 (!) 23  Temp:      TempSrc:      SpO2: 100% 100% 100% 100%   Physical Exam Constitutional:      General: He is not in acute distress.    Appearance: Normal appearance.     Comments: Thin elderly male  HENT:     Head: Normocephalic and atraumatic.     Mouth/Throat:     Mouth: Mucous membranes are moist.     Pharynx: Oropharynx is clear.  Eyes:     Extraocular Movements: Extraocular movements intact.     Pupils: Pupils are equal, round, and reactive to light.  Cardiovascular:     Rate and Rhythm: Normal rate and regular rhythm.     Pulses: Normal pulses.     Heart sounds: Normal heart sounds.  Pulmonary:     Effort: Pulmonary effort is normal. No respiratory distress.     Breath sounds: Normal breath sounds.  Abdominal:     General: Bowel sounds are normal. There is no distension.     Palpations: Abdomen is soft.     Tenderness: There is no abdominal tenderness.  Musculoskeletal:        General: No swelling or deformity.  Skin:    General: Skin is warm and dry.  Neurological:     General: No focal deficit present.     Mental Status: Mental status is at  baseline.    Labs on Admission: I have personally reviewed following labs and imaging studies  CBC: Recent Labs  Lab 02/18/21 1812  WBC 15.1*  NEUTROABS 9.6*  HGB 7.0*  HCT 23.5*  MCV 75.6*  PLT 721*    Basic Metabolic Panel: Recent Labs  Lab 02/18/21 1812  NA 139  K 4.1  CL 109  CO2 20*  GLUCOSE 108*  BUN 13  CREATININE 1.03  CALCIUM 9.0    GFR: CrCl cannot be calculated (Unknown ideal  weight.).  Liver Function Tests: Recent Labs  Lab 02/18/21 1812  AST 15  ALT 16  ALKPHOS 108  BILITOT 0.3  PROT 8.3*  ALBUMIN 2.3*    Urine analysis:    Component Value Date/Time   COLORURINE YELLOW 02/18/2021 2036   APPEARANCEUR CLEAR 02/18/2021 2036   APPEARANCEUR Clear 05/02/2017 1702   LABSPEC 1.028 02/18/2021 2036   PHURINE 5.0 02/18/2021 2036   GLUCOSEU NEGATIVE 02/18/2021 2036   HGBUR NEGATIVE 02/18/2021 2036   BILIRUBINUR NEGATIVE 02/18/2021 2036   BILIRUBINUR Negative 05/02/2017 1702   KETONESUR NEGATIVE 02/18/2021 2036   PROTEINUR 30 (A) 02/18/2021 2036   NITRITE NEGATIVE 02/18/2021 2036   LEUKOCYTESUR NEGATIVE 02/18/2021 2036    Radiological Exams on Admission: CT Head Wo Contrast  Result Date: 02/18/2021 CLINICAL DATA:  Dizziness and headache. EXAM: CT HEAD WITHOUT CONTRAST TECHNIQUE: Contiguous axial images were obtained from the base of the skull through the vertex without intravenous contrast. COMPARISON:  December 09, 2020 FINDINGS: Brain: There is mild cerebral atrophy with widening of the extra-axial spaces and ventricular dilatation. There are areas of decreased attenuation within the white matter tracts of the supratentorial brain, consistent with microvascular disease changes. Vascular: No hyperdense vessel or unexpected calcification. Skull: Normal. Negative for fracture or focal lesion. Sinuses/Orbits: No acute finding. Other: Small, ill-defined areas of partially calcified and noncalcified soft tissue attenuation are seen scattered throughout  the scalp. This is seen on the prior exam. A 1.9 cm x 0.3 cm right frontal scalp lipoma is seen. IMPRESSION: 1. No acute intracranial findings. 2. Small right frontal scalp lipoma. Electronically Signed   By: Virgina Norfolk M.D.   On: 02/18/2021 19:48   CT ABDOMEN PELVIS W CONTRAST  Result Date: 02/18/2021 CLINICAL DATA:  Left lower quadrant pain. EXAM: CT ABDOMEN AND PELVIS WITH CONTRAST TECHNIQUE: Multidetector CT imaging of the abdomen and pelvis was performed using the standard protocol following bolus administration of intravenous contrast. CONTRAST:  133mL OMNIPAQUE IOHEXOL 300 MG/ML  SOLN COMPARISON:  November 19, 2020 FINDINGS: Lower chest: No acute abnormality. Hepatobiliary: A stable 1.3 cm x 0.8 cm focus of parenchymal low attenuation is seen within the posterior aspect of the right lobe of the liver. Ill-defined 0.8 cm x 0.6 cm and 0.8 cm x 0.7 cm hyperdense foci are also seen within this region. Additional 1.9 cm x 1.2 cm and 1.6 cm x 1.5 cm hyperdense lesions are noted within the right lobe (axial CT images 14 and 17, CT series number 2). A 3.3 cm x 3.0 cm heterogeneous low-attenuation liver mass, with surrounding hyperdense rim, is seen adjacent to the caudate lobe (axial CT image 26, CT series number 2). No gallstones, gallbladder wall thickening, or biliary dilatation. Pancreas: Unremarkable. No pancreatic ductal dilatation or surrounding inflammatory changes. Spleen: Normal in size without focal abnormality. Adrenals/Urinary Tract: Adrenal glands are unremarkable. The right kidney is normal in size, without renal calculi, focal lesion, or hydronephrosis. Delayed renal cortical enhancement is seen on the left with mild left-sided perinephric inflammatory fat stranding. A 6.4 cm x 4.9 cm x 4.4 cm heterogeneous low-attenuation mass is seen within the lower pole of the left kidney. This is increased in size when compared to the prior study. An adjacent 5.5 cm x 3.9 cm x 3.6 cm area of  heterogeneous attenuation is seen along the lower pole of the left kidney. There is marked severity left-sided hydronephrosis and proximal hydroureter. The lumen of the proximal left ureter is filled with an area of heterogeneous  attenuation which extends inferiorly into the previously described area along the lower pole. Stomach/Bowel: Stomach is within normal limits. Appendix appears normal. No evidence of bowel wall thickening, distention, or inflammatory changes. Vascular/Lymphatic: Aortic atherosclerosis. No enlarged abdominal or pelvic lymph nodes. Reproductive: There is moderate to marked severity prostate gland enlargement. Other: No abdominal wall hernia or abnormality. No abdominopelvic ascites. Musculoskeletal: There is 2 mm to 3 mm retrolisthesis of the L5 vertebral body on S1. IMPRESSION: 1. Large mass within the lower pole of the left kidney with an adjacent chronic perinephric hematoma. 2. Extrarenal extension of the previously noted left renal mass with invasion into the proximal left ureter and subsequent partial obstruction of the left kidney. Further evaluation with MRI is recommended. 3. Findings consistent with multiple metastatic lesions within the liver. 4. Enlarged prostate gland. 5. 2 mm to 3 mm retrolisthesis of the L5 vertebral body on S1. 6. Aortic atherosclerosis. Aortic Atherosclerosis (ICD10-I70.0). Electronically Signed   By: Virgina Norfolk M.D.   On: 02/18/2021 20:27   DG Chest Port 1 View  Result Date: 02/18/2021 CLINICAL DATA:  Question sepsis. EXAM: PORTABLE CHEST 1 VIEW COMPARISON:  11/18/2020 FINDINGS: The heart size and mediastinal contours are within normal limits. Both lungs are clear. The visualized skeletal structures are unremarkable. IMPRESSION: No active disease. Electronically Signed   By: Franchot Gallo M.D.   On: 02/18/2021 18:51   EKG:  Unable to be reviewed due to technical difficulty with EMR.  Assessment/Plan Active Problems:   Essential hypertension    HLD (hyperlipidemia)   Metastatic renal cell carcinoma (HCC)   Leukocytosis   Symptomatic anemia  Symptomatic anemia > Presenting with dizziness and fatigue that is worse with position change.  Possibly some degree of orthostasis. > Hemoglobin noted to be 7.0 down from 8 at last check and trending down from 9 before that. > This is in the setting of chemotherapy for renal cell carcinoma as below. - FOBT negative in ED - Monitor on telemetry - Transfuse 1 unit, this was started in ED - Trend CBC  Renal cell carcinoma Leukocytosis Fever > Patient also noted to have leukocytosis and intermittent fevers. > Fevers have been since he started treatment for his cancer in December and leukocytosis is chronic having started over a month ago per labs from his oncology visit. > Suspected leukocytosis and fever both related to his cancer and treatment. > CT also demonstrated enlargement/extension of left kidney mass. > He is followed by Dr. Alen Blew and is currently being treated with salvage therapy of ipilimumab and nivolumab - Patient may benefit from oncology consult in the morning - We will continue to trend labs and monitor fever - We will not continue antibiotics as there does not appear to be acute infection at this time -Continue  Hypertension > This is in his chart but he seems to have chronically soft blood pressure in the 638 systolic it is stable at this time - Continue to monitor  Hyperlipidemia - Continue home atorvastatin  Diabetes - Hold Metformin - SSI   DVT prophylaxis: Lovenox  Code Status:   Full  Family Communication:  Wife updated at bedside  Disposition Plan:   Patient is from:  Home  Anticipated DC to:  Home  Anticipated DC date:  1 to 2 days  Anticipated DC barriers: None  Consults called:  None, may benefit from a discussion with either outpatient oncologist or inpatient consult in the morning given his progression of renal cell cancer in the  setting of  this pain as well as worsening anemia.  Admission status:  Observation, telemetry   Severity of Illness: The appropriate patient status for this patient is OBSERVATION. Observation status is judged to be reasonable and necessary in order to provide the required intensity of service to ensure the patient's safety. The patient's presenting symptoms, physical exam findings, and initial radiographic and laboratory data in the context of their medical condition is felt to place them at decreased risk for further clinical deterioration. Furthermore, it is anticipated that the patient will be medically stable for discharge from the hospital within 2 midnights of admission. The following factors support the patient status of observation.   " The patient's presenting symptoms include dizziness, weakness, fatigue, left lower quadrant pain. " The physical exam findings include stable physical exam when seen. " The initial radiographic and laboratory data are concerning for hemoglobin 7.0 down from recent of 8 2 weeks ago, platelets 721, leukocytosis 15.  PT, PTT, INR is 15.7, 47, 1.3 respectfully.  CT abdomen pelvis with known large left kidney mass with increasing size and protrusion to partially obstruct left ureter.Marcelyn Bruins MD [facility]  How to contact the [facility] Attending or Consulting provider [place] or covering provider during after hours [place], for this patient?   1. Check the care team in [facility] and look for a) attending/consulting [facility] provider listed and b) the [facility] team listed 2. Log into [email or web address] and use [place]'s universal password to access. If you do not have the password, please contact the hospital operator. 3. Locate the [facility] provider you are looking for under [facility] and page to a number that you can be directly reached. 4. If you still have difficulty reaching the provider, please page the [facility] (Director on Call) for the Hospitalists listed on amion for  assistance.  02/18/2021, 11:01 PM

## 2021-02-18 NOTE — ED Triage Notes (Signed)
Pt complains of dizziness, left lower quadrant pain x 1 week. Also reports fevers off and on since starting cancer treatment in December. Hx renal cancer.

## 2021-02-19 ENCOUNTER — Encounter (HOSPITAL_COMMUNITY): Payer: Self-pay | Admitting: Internal Medicine

## 2021-02-19 DIAGNOSIS — Z79899 Other long term (current) drug therapy: Secondary | ICD-10-CM | POA: Diagnosis not present

## 2021-02-19 DIAGNOSIS — Z7984 Long term (current) use of oral hypoglycemic drugs: Secondary | ICD-10-CM | POA: Diagnosis not present

## 2021-02-19 DIAGNOSIS — F1410 Cocaine abuse, uncomplicated: Secondary | ICD-10-CM | POA: Diagnosis present

## 2021-02-19 DIAGNOSIS — D630 Anemia in neoplastic disease: Secondary | ICD-10-CM | POA: Diagnosis present

## 2021-02-19 DIAGNOSIS — N1330 Unspecified hydronephrosis: Secondary | ICD-10-CM

## 2021-02-19 DIAGNOSIS — E785 Hyperlipidemia, unspecified: Secondary | ICD-10-CM | POA: Diagnosis present

## 2021-02-19 DIAGNOSIS — C7800 Secondary malignant neoplasm of unspecified lung: Secondary | ICD-10-CM | POA: Diagnosis present

## 2021-02-19 DIAGNOSIS — C787 Secondary malignant neoplasm of liver and intrahepatic bile duct: Secondary | ICD-10-CM | POA: Diagnosis present

## 2021-02-19 DIAGNOSIS — R509 Fever, unspecified: Secondary | ICD-10-CM | POA: Diagnosis present

## 2021-02-19 DIAGNOSIS — Z9221 Personal history of antineoplastic chemotherapy: Secondary | ICD-10-CM | POA: Diagnosis not present

## 2021-02-19 DIAGNOSIS — R413 Other amnesia: Secondary | ICD-10-CM | POA: Diagnosis present

## 2021-02-19 DIAGNOSIS — N400 Enlarged prostate without lower urinary tract symptoms: Secondary | ICD-10-CM | POA: Diagnosis present

## 2021-02-19 DIAGNOSIS — E119 Type 2 diabetes mellitus without complications: Secondary | ICD-10-CM | POA: Diagnosis present

## 2021-02-19 DIAGNOSIS — R1032 Left lower quadrant pain: Secondary | ICD-10-CM | POA: Diagnosis present

## 2021-02-19 DIAGNOSIS — D649 Anemia, unspecified: Secondary | ICD-10-CM | POA: Diagnosis present

## 2021-02-19 DIAGNOSIS — F1020 Alcohol dependence, uncomplicated: Secondary | ICD-10-CM | POA: Diagnosis present

## 2021-02-19 DIAGNOSIS — R5383 Other fatigue: Secondary | ICD-10-CM | POA: Diagnosis present

## 2021-02-19 DIAGNOSIS — I10 Essential (primary) hypertension: Secondary | ICD-10-CM | POA: Diagnosis present

## 2021-02-19 DIAGNOSIS — R42 Dizziness and giddiness: Secondary | ICD-10-CM | POA: Diagnosis present

## 2021-02-19 DIAGNOSIS — D72829 Elevated white blood cell count, unspecified: Secondary | ICD-10-CM | POA: Diagnosis present

## 2021-02-19 DIAGNOSIS — Z87891 Personal history of nicotine dependence: Secondary | ICD-10-CM | POA: Diagnosis not present

## 2021-02-19 DIAGNOSIS — C642 Malignant neoplasm of left kidney, except renal pelvis: Secondary | ICD-10-CM | POA: Diagnosis present

## 2021-02-19 DIAGNOSIS — Z20822 Contact with and (suspected) exposure to covid-19: Secondary | ICD-10-CM | POA: Diagnosis present

## 2021-02-19 DIAGNOSIS — Z833 Family history of diabetes mellitus: Secondary | ICD-10-CM | POA: Diagnosis not present

## 2021-02-19 DIAGNOSIS — R6510 Systemic inflammatory response syndrome (SIRS) of non-infectious origin without acute organ dysfunction: Secondary | ICD-10-CM | POA: Diagnosis present

## 2021-02-19 LAB — COMPREHENSIVE METABOLIC PANEL
ALT: 14 U/L (ref 0–44)
AST: 12 U/L — ABNORMAL LOW (ref 15–41)
Albumin: 2 g/dL — ABNORMAL LOW (ref 3.5–5.0)
Alkaline Phosphatase: 100 U/L (ref 38–126)
Anion gap: 10 (ref 5–15)
BUN: 13 mg/dL (ref 8–23)
CO2: 20 mmol/L — ABNORMAL LOW (ref 22–32)
Calcium: 8.6 mg/dL — ABNORMAL LOW (ref 8.9–10.3)
Chloride: 103 mmol/L (ref 98–111)
Creatinine, Ser: 0.92 mg/dL (ref 0.61–1.24)
GFR, Estimated: >60 mL/min (ref >60)
Glucose, Bld: 93 mg/dL (ref 70–99)
Potassium: 4.2 mmol/L (ref 3.5–5.1)
Sodium: 133 mmol/L — ABNORMAL LOW (ref 135–145)
Total Bilirubin: 0.5 mg/dL (ref 0.3–1.2)
Total Protein: 8 g/dL (ref 6.5–8.1)

## 2021-02-19 LAB — GLUCOSE, CAPILLARY
Glucose-Capillary: 152 mg/dL — ABNORMAL HIGH (ref 70–99)
Glucose-Capillary: 100 mg/dL — ABNORMAL HIGH (ref 70–99)

## 2021-02-19 LAB — CBC
HCT: 25.7 % — ABNORMAL LOW (ref 39.0–52.0)
Hemoglobin: 7.8 g/dL — ABNORMAL LOW (ref 13.0–17.0)
MCH: 22.7 pg — ABNORMAL LOW (ref 26.0–34.0)
MCHC: 30.4 g/dL (ref 30.0–36.0)
MCV: 74.9 fL — ABNORMAL LOW (ref 80.0–100.0)
Platelets: 658 10*3/uL — ABNORMAL HIGH (ref 150–400)
RBC: 3.43 MIL/uL — ABNORMAL LOW (ref 4.22–5.81)
RDW: 18.2 % — ABNORMAL HIGH (ref 11.5–15.5)
WBC: 13.1 10*3/uL — ABNORMAL HIGH (ref 4.0–10.5)
nRBC: 0 % (ref 0.0–0.2)

## 2021-02-19 LAB — CBG MONITORING, ED
Glucose-Capillary: 83 mg/dL (ref 70–99)
Glucose-Capillary: 74 mg/dL (ref 70–99)

## 2021-02-19 LAB — ABO/RH: ABO/RH(D): O POS

## 2021-02-19 LAB — SARS CORONAVIRUS 2 (TAT 6-24 HRS): SARS Coronavirus 2: NEGATIVE

## 2021-02-19 MED ORDER — IBUPROFEN 200 MG PO TABS: 600.0000 mg | ORAL_TABLET | Freq: Four times a day (QID) | ORAL | Status: DC | PRN

## 2021-02-19 NOTE — Progress Notes (Signed)
IP PROGRESS NOTE  Subjective:   Chad Cantu is known to me with history of advanced renal cell carcinoma currently undergoing treatment with ipilimumab and nivolumab.  He has received 2 cycles of therapy with the next cycle scheduled on March 15.  He presented with symptoms of fevers, dizziness and abdominal pain.  His evaluation revealed worsening anemia with a hemoglobin of 7.  CT scan showed persistence of a large mass in the left kidney associated with chronic perinephric hematoma.  Metastatic disease in the liver appears to have progressed at this time.  Clinically, he reports feeling slightly better at this time.  He denies any nausea vomiting or diarrhea.  He denies any abdominal discomfort.  Objective:  Vital signs in last 24 hours: Temp:  [98.1 F (36.7 C)-100.5 F (38.1 C)] 98.1 F (36.7 C) (03/10 0315) Pulse Rate:  [69-126] 85 (03/10 1200) Resp:  [14-27] 15 (03/10 1200) BP: (100-138)/(64-83) 138/83 (03/10 1200) SpO2:  [99 %-100 %] 100 % (03/10 1200) Weight:  [131 lb 6.3 oz (59.6 kg)] 131 lb 6.3 oz (59.6 kg) (03/10 0315) Weight change:     Intake/Output from previous day: 03/09 0701 - 03/10 0700 In: 2038.7 [P.O.:200; TMAUQ:333; IV Piggyback:1190.7] Out: 350 [Urine:350] General: Alert, awake without distress. Head: Normocephalic atraumatic. Mouth: mucous membranes moist, pharynx normal without lesions Eyes: No scleral icterus.  Pupils are equal and round reactive to light. Resp: clear to auscultation bilaterally without rhonchi or wheezes or dullness to percussion. Cardio: regular rate and rhythm, S1, S2 normal, no murmur, click, rub or gallop GI: soft, non-tender; bowel sounds normal; no masses,  no organomegaly Musculoskeletal: No joint deformity or effusion. Neurological: No motor, sensory deficits.  Intact deep tendon reflexes. Skin: No rashes or lesions.    Lab Results: Recent Labs    02/18/21 1812 02/19/21 0640  WBC 15.1* 13.1*  HGB 7.0* 7.8*  HCT 23.5*  25.7*  PLT 721* 658*    BMET Recent Labs    02/18/21 1812 02/19/21 0640  NA 139 133*  K 4.1 4.2  CL 109 103  CO2 20* 20*  GLUCOSE 108* 93  BUN 13 13  CREATININE 1.03 0.92  CALCIUM 9.0 8.6*    Studies/Results: CT Head Wo Contrast  Result Date: 02/18/2021 CLINICAL DATA:  Dizziness and headache. EXAM: CT HEAD WITHOUT CONTRAST TECHNIQUE: Contiguous axial images were obtained from the base of the skull through the vertex without intravenous contrast. COMPARISON:  December 09, 2020 FINDINGS: Brain: There is mild cerebral atrophy with widening of the extra-axial spaces and ventricular dilatation. There are areas of decreased attenuation within the white matter tracts of the supratentorial brain, consistent with microvascular disease changes. Vascular: No hyperdense vessel or unexpected calcification. Skull: Normal. Negative for fracture or focal lesion. Sinuses/Orbits: No acute finding. Other: Small, ill-defined areas of partially calcified and noncalcified soft tissue attenuation are seen scattered throughout the scalp. This is seen on the prior exam. A 1.9 cm x 0.3 cm right frontal scalp lipoma is seen. IMPRESSION: 1. No acute intracranial findings. 2. Small right frontal scalp lipoma. Electronically Signed   By: Virgina Norfolk M.D.   On: 02/18/2021 19:48   CT ABDOMEN PELVIS W CONTRAST  Result Date: 02/18/2021 CLINICAL DATA:  Left lower quadrant pain. EXAM: CT ABDOMEN AND PELVIS WITH CONTRAST TECHNIQUE: Multidetector CT imaging of the abdomen and pelvis was performed using the standard protocol following bolus administration of intravenous contrast. CONTRAST:  169mL OMNIPAQUE IOHEXOL 300 MG/ML  SOLN COMPARISON:  November 19, 2020 FINDINGS:  Lower chest: No acute abnormality. Hepatobiliary: A stable 1.3 cm x 0.8 cm focus of parenchymal low attenuation is seen within the posterior aspect of the right lobe of the liver. Ill-defined 0.8 cm x 0.6 cm and 0.8 cm x 0.7 cm hyperdense foci are also seen  within this region. Additional 1.9 cm x 1.2 cm and 1.6 cm x 1.5 cm hyperdense lesions are noted within the right lobe (axial CT images 14 and 17, CT series number 2). A 3.3 cm x 3.0 cm heterogeneous low-attenuation liver mass, with surrounding hyperdense rim, is seen adjacent to the caudate lobe (axial CT image 26, CT series number 2). No gallstones, gallbladder wall thickening, or biliary dilatation. Pancreas: Unremarkable. No pancreatic ductal dilatation or surrounding inflammatory changes. Spleen: Normal in size without focal abnormality. Adrenals/Urinary Tract: Adrenal glands are unremarkable. The right kidney is normal in size, without renal calculi, focal lesion, or hydronephrosis. Delayed renal cortical enhancement is seen on the left with mild left-sided perinephric inflammatory fat stranding. A 6.4 cm x 4.9 cm x 4.4 cm heterogeneous low-attenuation mass is seen within the lower pole of the left kidney. This is increased in size when compared to the prior study. An adjacent 5.5 cm x 3.9 cm x 3.6 cm area of heterogeneous attenuation is seen along the lower pole of the left kidney. There is marked severity left-sided hydronephrosis and proximal hydroureter. The lumen of the proximal left ureter is filled with an area of heterogeneous attenuation which extends inferiorly into the previously described area along the lower pole. Stomach/Bowel: Stomach is within normal limits. Appendix appears normal. No evidence of bowel wall thickening, distention, or inflammatory changes. Vascular/Lymphatic: Aortic atherosclerosis. No enlarged abdominal or pelvic lymph nodes. Reproductive: There is moderate to marked severity prostate gland enlargement. Other: No abdominal wall hernia or abnormality. No abdominopelvic ascites. Musculoskeletal: There is 2 mm to 3 mm retrolisthesis of the L5 vertebral body on S1. IMPRESSION: 1. Large mass within the lower pole of the left kidney with an adjacent chronic perinephric hematoma. 2.  Extrarenal extension of the previously noted left renal mass with invasion into the proximal left ureter and subsequent partial obstruction of the left kidney. Further evaluation with MRI is recommended. 3. Findings consistent with multiple metastatic lesions within the liver. 4. Enlarged prostate gland. 5. 2 mm to 3 mm retrolisthesis of the L5 vertebral body on S1. 6. Aortic atherosclerosis. Aortic Atherosclerosis (ICD10-I70.0). Electronically Signed   By: Virgina Norfolk M.D.   On: 02/18/2021 20:27   DG Chest Port 1 View  Result Date: 02/18/2021 CLINICAL DATA:  Question sepsis. EXAM: PORTABLE CHEST 1 VIEW COMPARISON:  11/18/2020 FINDINGS: The heart size and mediastinal contours are within normal limits. Both lungs are clear. The visualized skeletal structures are unremarkable. IMPRESSION: No active disease. Electronically Signed   By: Franchot Gallo M.D.   On: 02/18/2021 18:51    Medications: I have reviewed the patient's current medications.  Assessment/Plan:   70 year old with:  1.   Advanced kidney cancer diagnosed in December 2021.  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   He is currently receiving therapy with ipilimumab and nivolumab without any major complications.  CT scan obtained on February 18, 2021 showed no regression of his disease of possible slight progression.  We will evaluate risks and benefits of continuing this current treatment versus alternative treatment options with an oral targeted therapy upon his discharge.  2.  Anemia: Related to malignancy and chronic  disease.  I agree with the supportive care and transfusion.   3.  Fever: I agree with ruling out infectious etiology and the current management.  If no source of infection is identified, this could be attributed to his malignancy.  4.  Obstruction of the left kidney noted on imaging studies: His kidney function remained stable at this time.  Further urological  evaluation may be needed if he develops worsening renal failure.   5. Follow-up: I have no objections to discharge if he is stable per the primary team.  Oncology follow-up as already established.  59minutes were  spent on this encounter.  50% of the the time was spent face-to-face and was dedicated to reviewing imaging studies, treatment options and plan of care discussion      LOS: 0 days   Zola Button 02/19/2021, 12:32 PM

## 2021-02-19 NOTE — Progress Notes (Signed)
PROGRESS NOTE    Chad Cantu  JJK:[id number] DOB: 1951-11-11 DOA: 02/18/2021 PCP: [name], MD     Brief Narrative:  Chad Cantu is a 70 y.o. male with medical history significant of alcohol use, hypertension, hyperlipidemia, memory deficit, diabetes, metastatic renal cell carcinoma who presents with dizziness and left lower quadrant pain for 1 week. He notes that he has dizziness that is worse with position change including from sitting to standing. He also reports left lower quadrant pain and some diarrhea as well as increased weakness and fatigue.  He describes the diarrhea as a single episode of loose stool not multiple episodes. He has had intermittent fevers since starting chemo treatment in December. He denies chest pain.  Reports fatigue and increased feelings of weakness as above.  CT abdomen pelvis revealed large mass in the lower pole of his left kidney which is known also showed apparent extension with protrusion into the left ureter with partial obstruction of the left kidney.  Patient was admitted with symptomatic anemia, given 1 unit packed red blood cells.  New events last 24 hours / Subjective: Patient seen in the emergency department, no complaints, although remains somewhat of a poor historian.  Admits to some dizziness and weakness, intermittent fevers.  Left lower quadrant pain is better.  Has been able to urinate without any issues.  Assessment & Plan:   Active Problems:   Essential hypertension   HLD (hyperlipidemia)   Metastatic renal cell carcinoma (HCC)   Leukocytosis   Symptomatic anemia    Symptomatic anemia -Transfused 1 unit packed red blood cell in the emergency department -In setting of immunotherapy for RCC -FOBT negative -Continue ferrous sulfate -Trend hemoglobin -Check orthostatic vital sign  Metastatic renal cell carcinoma -CT abdomen pelvis revealed large mass within the lower pole of the left kidney with an adjacent chronic  perinephric hematoma. Extrarenal extension of the previously noted left renal mass with invasion into the proximal left ureter and subsequent partial obstruction of the left kidney. -Appreciate oncology Dr. Alen Blew [facility] urology, spoke with Dr. Junious Silk over the phone, as patient's renal function is stable and patient having good urine output, likely no acute intervention to be offered at this time  SIRS -Presented with fever 100.5, HR 126, RR 20, WBC 15.1 -No sign of infectious process at this time, monitor off antibiotics for now.  He did receive empiric Rocephin, Flagyl in the emergency department -UA unremarkable -Blood culture pending   Hyperlipidemia -Continue Lipitor    DVT prophylaxis:  enoxaparin (LOVENOX) injection 40 mg Start: 02/19/21 1000  Code Status: Full code Family Communication: No family at bedside Disposition Plan:  Status is: Observation  The patient will require care spanning > 2 midnights and should be moved to inpatient because: Ongoing diagnostic testing needed not appropriate for outpatient work up  Dispo: The patient is from: Home              Anticipated d/c is to: Home              Patient currently is not medically stable to d/c.  Check orthostatic vital sign, follow oncology, urology recommendation, follow blood culture, PT OT consulted    Difficult to place patient No    Antimicrobials:  Anti-infectives (From admission, onward)   Start     Dose/Rate Route Frequency Ordered Stop   02/18/21 1930  cefTRIAXone (ROCEPHIN) 2 g in sodium chloride 0.9 % 100 mL IVPB       "And" Linked Group Details  2 g 200 mL/hr over 30 Minutes Intravenous  Once 02/18/21 1924 02/18/21 2044   02/18/21 1930  metroNIDAZOLE (FLAGYL) IVPB 500 mg       "And" Linked Group Details   500 mg 100 mL/hr over 60 Minutes Intravenous  Once 02/18/21 1924 02/18/21 2144        Objective: Vitals:   02/19/21 0600 02/19/21 0900 02/19/21 1200 02/19/21 1233  BP: 104/70 104/73  138/83 138/83  Pulse: 74 79 85 88  Resp: 15 18 15  (!) 22  Temp:    98.3 F (36.8 C)  TempSrc:    Oral  SpO2: 100% 99% 100% 99%  Weight:      Height:        Intake/Output Summary (Last 24 hours) at 02/19/2021 1246 Last data filed at 02/19/2021 7001 Gross per 24 hour  Intake 2038.71 ml  Output 350 ml  Net 1688.71 ml   Filed Weights   02/19/21 0315  Weight: 59.6 kg    Examination:  General exam: Appears calm and comfortable  Respiratory system: Clear to auscultation. Respiratory effort normal. No respiratory distress. No conversational dyspnea.  Cardiovascular system: S1 & S2 heard, RRR. No murmurs. No pedal edema. Gastrointestinal system: Abdomen is nondistended, soft and nontender. Normal bowel sounds heard. Central nervous system: Alert and oriented. No focal neurological deficits. Speech clear.  Extremities: Symmetric in appearance  Skin: No rashes, lesions or ulcers on exposed skin  Psychiatry: Judgement and insight appear normal. Mood & affect appropriate.   Data Reviewed: I have personally reviewed following labs and imaging studies  CBC: Recent Labs  Lab 02/18/21 1812 02/19/21 0640  WBC 15.1* 13.1*  NEUTROABS 9.6*  --   HGB 7.0* 7.8*  HCT 23.5* 25.7*  MCV 75.6* 74.9*  PLT 721* 749*   Basic Metabolic Panel: Recent Labs  Lab 02/18/21 1812 02/19/21 0640  NA 139 133*  K 4.1 4.2  CL 109 103  CO2 20* 20*  GLUCOSE 108* 93  BUN 13 13  CREATININE 1.03 0.92  CALCIUM 9.0 8.6*   GFR: Estimated Creatinine Clearance: 65.7 mL/min (by C-G formula based on SCr of 0.92 mg/dL). Liver Function Tests: Recent Labs  Lab 02/18/21 1812 02/19/21 0640  AST 15 12*  ALT 16 14  ALKPHOS 108 100  BILITOT 0.3 0.5  PROT 8.3* 8.0  ALBUMIN 2.3* 2.0*   No results for input(s): LIPASE, AMYLASE in the last 168 hours. No results for input(s): AMMONIA in the last 168 hours. Coagulation Profile: Recent Labs  Lab 02/18/21 1812  INR 1.3*   Cardiac Enzymes: No results for  input(s): CKTOTAL, CKMB, CKMBINDEX, TROPONINI in the last 168 hours. BNP (last 3 results) No results for input(s): PROBNP in the last 8760 hours. HbA1C: No results for input(s): HGBA1C in the last 72 hours. CBG: Recent Labs  Lab 02/19/21 0754 02/19/21 1157  GLUCAP 83 74   Lipid Profile: No results for input(s): CHOL, HDL, LDLCALC, TRIG, CHOLHDL, LDLDIRECT in the last 72 hours. Thyroid Function Tests: No results for input(s): TSH, T4TOTAL, FREET4, T3FREE, THYROIDAB in the last 72 hours. Anemia Panel: No results for input(s): VITAMINB12, FOLATE, FERRITIN, TIBC, IRON, RETICCTPCT in the last 72 hours. Sepsis Labs: Recent Labs  Lab 02/18/21 1812  LATICACIDVEN 1.9    Recent Results (from the past 240 hour(s))  SARS CORONAVIRUS 2 (TAT 6-24 HRS) Nasopharyngeal Nasopharyngeal Swab     Status: None   Collection Time: 02/18/21  6:20 PM   Specimen: Nasopharyngeal Swab  Result Value Ref Range  Status   SARS Coronavirus 2 NEGATIVE NEGATIVE Final    Comment: (NOTE) SARS-CoV-2 target nucleic acids are NOT DETECTED.  The SARS-CoV-2 RNA is generally detectable in upper and lower respiratory specimens during the acute phase of infection. Negative results do not preclude SARS-CoV-2 infection, do not rule out co-infections with other pathogens, and should not be used as the sole basis for treatment or other patient management decisions. Negative results must be combined with clinical observations, patient history, and epidemiological information. The expected result is Negative.  Fact Sheet for Patients: SugarRoll.be  Fact Sheet for Healthcare Providers: https://www.woods-mathews.com/  This test is not yet approved or cleared by the [place] FDA and  has been authorized for detection and/or diagnosis of SARS-CoV-2 by FDA under an Emergency Use Authorization (EUA). This EUA will remain  in effect (meaning this test can be used) for the  duration of the COVID-19 declaration under Se ction 564(b)(1) of the Act, 21 U.S.C. section 360bbb-3(b)(1), unless the authorization is terminated or revoked sooner.  Performed at [facility], [place] [address], [place], [place] [address]       Radiology Studies: CT Head Wo Contrast  Result Date: 02/18/2021 CLINICAL DATA:  Dizziness and headache. EXAM: CT HEAD WITHOUT CONTRAST TECHNIQUE: Contiguous axial images were obtained from the base of the skull through the vertex without intravenous contrast. COMPARISON:  December 09, 2020 FINDINGS: Brain: There is mild cerebral atrophy with widening of the extra-axial spaces and ventricular dilatation. There are areas of decreased attenuation within the white matter tracts of the supratentorial brain, consistent with microvascular disease changes. Vascular: No hyperdense vessel or unexpected calcification. Skull: Normal. Negative for fracture or focal lesion. Sinuses/Orbits: No acute finding. Other: Small, ill-defined areas of partially calcified and noncalcified soft tissue attenuation are seen scattered throughout the scalp. This is seen on the prior exam. A 1.9 cm x 0.3 cm right frontal scalp lipoma is seen. IMPRESSION: 1. No acute intracranial findings. 2. Small right frontal scalp lipoma. Electronically Signed   By: Virgina Norfolk M.D.   On: 02/18/2021 19:48   CT ABDOMEN PELVIS W CONTRAST  Result Date: 02/18/2021 CLINICAL DATA:  Left lower quadrant pain. EXAM: CT ABDOMEN AND PELVIS WITH CONTRAST TECHNIQUE: Multidetector CT imaging of the abdomen and pelvis was performed using the standard protocol following bolus administration of intravenous contrast. CONTRAST:  130mL OMNIPAQUE IOHEXOL 300 MG/ML  SOLN COMPARISON:  November 19, 2020 FINDINGS: Lower chest: No acute abnormality. Hepatobiliary: A stable 1.3 cm x 0.8 cm focus of parenchymal low attenuation is seen within the posterior aspect of the right lobe of the liver. Ill-defined 0.8 cm x 0.6 cm  and 0.8 cm x 0.7 cm hyperdense foci are also seen within this region. Additional 1.9 cm x 1.2 cm and 1.6 cm x 1.5 cm hyperdense lesions are noted within the right lobe (axial CT images 14 and 17, CT series number 2). A 3.3 cm x 3.0 cm heterogeneous low-attenuation liver mass, with surrounding hyperdense rim, is seen adjacent to the caudate lobe (axial CT image 26, CT series number 2). No gallstones, gallbladder wall thickening, or biliary dilatation. Pancreas: Unremarkable. No pancreatic ductal dilatation or surrounding inflammatory changes. Spleen: Normal in size without focal abnormality. Adrenals/Urinary Tract: Adrenal glands are unremarkable. The right kidney is normal in size, without renal calculi, focal lesion, or hydronephrosis. Delayed renal cortical enhancement is seen on the left with mild left-sided perinephric inflammatory fat stranding. A 6.4 cm x 4.9 cm x 4.4 cm heterogeneous  low-attenuation mass is seen within the lower pole of the left kidney. This is increased in size when compared to the prior study. An adjacent 5.5 cm x 3.9 cm x 3.6 cm area of heterogeneous attenuation is seen along the lower pole of the left kidney. There is marked severity left-sided hydronephrosis and proximal hydroureter. The lumen of the proximal left ureter is filled with an area of heterogeneous attenuation which extends inferiorly into the previously described area along the lower pole. Stomach/Bowel: Stomach is within normal limits. Appendix appears normal. No evidence of bowel wall thickening, distention, or inflammatory changes. Vascular/Lymphatic: Aortic atherosclerosis. No enlarged abdominal or pelvic lymph nodes. Reproductive: There is moderate to marked severity prostate gland enlargement. Other: No abdominal wall hernia or abnormality. No abdominopelvic ascites. Musculoskeletal: There is 2 mm to 3 mm retrolisthesis of the L5 vertebral body on S1. IMPRESSION: 1. Large mass within the lower pole of the left kidney  with an adjacent chronic perinephric hematoma. 2. Extrarenal extension of the previously noted left renal mass with invasion into the proximal left ureter and subsequent partial obstruction of the left kidney. Further evaluation with MRI is recommended. 3. Findings consistent with multiple metastatic lesions within the liver. 4. Enlarged prostate gland. 5. 2 mm to 3 mm retrolisthesis of the L5 vertebral body on S1. 6. Aortic atherosclerosis. Aortic Atherosclerosis (ICD10-I70.0). Electronically Signed   By: Virgina Norfolk M.D.   On: 02/18/2021 20:27   DG Chest Port 1 View  Result Date: 02/18/2021 CLINICAL DATA:  Question sepsis. EXAM: PORTABLE CHEST 1 VIEW COMPARISON:  11/18/2020 FINDINGS: The heart size and mediastinal contours are within normal limits. Both lungs are clear. The visualized skeletal structures are unremarkable. IMPRESSION: No active disease. Electronically Signed   By: Franchot Gallo M.D.   On: 02/18/2021 18:51      Scheduled Meds: . atorvastatin  20 mg Oral Daily  . enoxaparin (LOVENOX) injection  40 mg Subcutaneous Q24H  . ferrous sulfate  325 mg Oral Q breakfast  . insulin aspart  0-9 Units Subcutaneous TID WC  . megestrol  400 mg Oral Daily  . sodium chloride flush  3 mL Intravenous Q12H   Continuous Infusions:   LOS: 0 days      Time spent: 30 minutes   Dessa Phi, DO [facility] 02/19/2021, 12:46 PM   Available via Epic secure chat 7am-7pm After these hours, please refer to coverage provider listed on [email or web address]

## 2021-02-19 NOTE — Consult Note (Signed)
Urology Consult   Physician requesting consult: Neva Seat  Reason for consult: Left hydronephrosis  History of Present Illness: Chad Cantu is a 70 y.o. male with PMH significant for DM, ETOH abuse, HTN, and metastatic renal cell carcinoma presented to the ED yesterday for eval of dizziness and left lower quadrant abdominal pain x 1 week.  He has had intermittent fevers since starting chemo in December.  His dizziness appears to be related to orthostasis and his abd pain resolved while in the ED.  Urine and blood cultures have been obtained.  CXR showed no acute abnormalities. UA negative. WBC 13. Cr 0.92.  H/H 7.8/25.7. CT A/P revealed extrarenal extension of previously noted left renal mass with invasion into the proximal left ureter and subsequent partial obstruction of the left kidney, multiple mets in the liver, and an enlarged prostate.  Urology consult was requested due to new hydro noted on CT.  Pt is resting comfortably in the ED waiting for a bed on the floor. He is still having some nausea.  He states his abdominal pain has resolved.  He has regular BMs and denies LUTs. He also denies HA, chills, CP, SOB, flank pain, and back pain.     Past Medical History:  Diagnosis Date  . Cancer ([place])   . Diabetes mellitus without complication ([place])   . ETOH abuse   . Hyperlipidemia   . Hypertension   . Substance abuse ([place])    ETOH, cocaine.  Remission    Past Surgical History:  Procedure Laterality Date  . COLONOSCOPY  5 years ago?   at [facility] maybe per pt,?unsure results  . LAPAROSCOPIC GASTROTOMY W/ REPAIR OF ULCER      Current Hospital Medications:  Home Meds:  . Current Meds  Medication Sig  . acetaminophen (TYLENOL) 325 MG tablet Take 2 tablets (650 mg total) by mouth every 6 (six) hours as needed for mild pain (or Fever >/= 101).  . Ascorbic Acid (VITAMIN C PO) Take by mouth.  Marland Kitchen atorvastatin (LIPITOR) 20 MG tablet Take 1 tablet (20 mg total) by mouth daily.  .  ferrous sulfate 325 (65 FE) MG EC tablet Take 1 tablet (325 mg total) by mouth in the morning and at bedtime.  . megestrol (MEGACE) 40 MG/ML suspension Take 10 mLs (400 mg total) by mouth daily. (Patient taking differently: Take 400 mg by mouth daily as needed.)  . metFORMIN (GLUCOPHAGE) 500 MG tablet Take 1 tablet (500 mg total) by mouth daily with breakfast.     Scheduled Meds: . atorvastatin  20 mg Oral Daily  . enoxaparin (LOVENOX) injection  40 mg Subcutaneous Q24H  . ferrous sulfate  325 mg Oral Q breakfast  . insulin aspart  0-9 Units Subcutaneous TID WC  . megestrol  400 mg Oral Daily  . sodium chloride flush  3 mL Intravenous Q12H   Continuous Infusions: PRN Meds:.acetaminophen **OR** acetaminophen, ondansetron  Allergies: No Known Allergies  Family History  Problem Relation Age of Onset  . Diabetes Mother   . Colon cancer Neg Hx   . Colon polyps Neg Hx   . Esophageal cancer Neg Hx   . Rectal cancer Neg Hx   . Stomach cancer Neg Hx     Social History:  reports that he has quit smoking. His smoking use included cigarettes. He quit after 55.00 years of use. He has never used smokeless tobacco. He reports current alcohol use of about 12.0 standard drinks of alcohol per week. He reports current  drug use. Drug: Marijuana.  ROS: A complete review of systems was performed.  All systems are negative except for pertinent findings as noted.  Physical Exam:  Vital signs in last 24 hours: Temp:  [98.1 F (36.7 C)-100.5 F (38.1 C)] 98.3 F (36.8 C) (03/10 1233) Pulse Rate:  [69-126] 88 (03/10 1233) Resp:  [14-27] 22 (03/10 1233) BP: (100-138)/(64-83) 138/83 (03/10 1233) SpO2:  [99 %-100 %] 99 % (03/10 1233) Weight:  [59.6 kg] 59.6 kg (03/10 0315) Constitutional:  Alert and oriented, No acute distress Cardiovascular: Regular rate and rhythm Respiratory: Normal respiratory effort GI: Abdomen is soft, nontender, nondistended, no abdominal masses GU: No CVA  tenderness Lymphatic: No lymphadenopathy Neurologic: Grossly intact, no focal deficits Psychiatric: Normal mood and affect  Laboratory Data:  Recent Labs    02/18/21 1812 02/19/21 0640  WBC 15.1* 13.1*  HGB 7.0* 7.8*  HCT 23.5* 25.7*  PLT 721* 658*    Recent Labs    02/18/21 1812 02/19/21 0640  NA 139 133*  K 4.1 4.2  CL 109 103  GLUCOSE 108* 93  BUN 13 13  CALCIUM 9.0 8.6*  CREATININE 1.03 0.92     Results for orders placed or performed during the hospital encounter of 02/18/21 (from the past 24 hour(s))  Lactic acid, plasma     Status: None   Collection Time: 02/18/21  6:12 PM  Result Value Ref Range   Lactic Acid, Venous 1.9 0.5 - 1.9 mmol/L  Comprehensive metabolic panel     Status: Abnormal   Collection Time: 02/18/21  6:12 PM  Result Value Ref Range   Sodium 139 135 - 145 mmol/L   Potassium 4.1 3.5 - 5.1 mmol/L   Chloride 109 98 - 111 mmol/L   CO2 20 (L) 22 - 32 mmol/L   Glucose, Bld 108 (H) 70 - 99 mg/dL   BUN 13 8 - 23 mg/dL   Creatinine, Ser 1.03 0.61 - 1.24 mg/dL   Calcium 9.0 8.9 - 10.3 mg/dL   Total Protein 8.3 (H) 6.5 - 8.1 g/dL   Albumin 2.3 (L) 3.5 - 5.0 g/dL   AST 15 15 - 41 U/L   ALT 16 0 - 44 U/L   Alkaline Phosphatase 108 38 - 126 U/L   Total Bilirubin 0.3 0.3 - 1.2 mg/dL   GFR, Estimated >60 >60 mL/min   Anion gap 10 5 - 15  CBC WITH DIFFERENTIAL     Status: Abnormal   Collection Time: 02/18/21  6:12 PM  Result Value Ref Range   WBC 15.1 (H) 4.0 - 10.5 K/uL   RBC 3.11 (L) 4.22 - 5.81 MIL/uL   Hemoglobin 7.0 (L) 13.0 - 17.0 g/dL   HCT 23.5 (L) 39.0 - 52.0 %   MCV 75.6 (L) 80.0 - 100.0 fL   MCH 22.5 (L) 26.0 - 34.0 pg   MCHC 29.8 (L) 30.0 - 36.0 g/dL   RDW 18.3 (H) 11.5 - 15.5 %   Platelets 721 (H) 150 - 400 K/uL   nRBC 0.0 0.0 - 0.2 %   Neutrophils Relative % 63 %   Neutro Abs 9.6 (H) 1.7 - 7.7 K/uL   Lymphocytes Relative 19 %   Lymphs Abs 2.8 0.7 - 4.0 K/uL   Monocytes Relative 15 %   Monocytes Absolute 2.3 (H) 0.1 - 1.0 K/uL    Eosinophils Relative 1 %   Eosinophils Absolute 0.1 0.0 - 0.5 K/uL   Basophils Relative 1 %   Basophils Absolute 0.1 0.0 -  0.1 K/uL   Immature Granulocytes 1 %   Abs Immature Granulocytes 0.14 (H) 0.00 - 0.07 K/uL  Protime-INR     Status: Abnormal   Collection Time: 02/18/21  6:12 PM  Result Value Ref Range   Prothrombin Time 15.7 (H) 11.4 - 15.2 seconds   INR 1.3 (H) 0.8 - 1.2  APTT     Status: Abnormal   Collection Time: 02/18/21  6:12 PM  Result Value Ref Range   aPTT 47 (H) 24 - 36 seconds  ABO/Rh     Status: None   Collection Time: 02/18/21  6:12 PM  Result Value Ref Range   ABO/RH(D)      O POS Performed at [facility], [place] [address] [place], [place] [address]   SARS CORONAVIRUS 2 (TAT 6-24 HRS) Nasopharyngeal Nasopharyngeal Swab     Status: None   Collection Time: 02/18/21  6:20 PM   Specimen: Nasopharyngeal Swab  Result Value Ref Range   SARS Coronavirus 2 NEGATIVE NEGATIVE  Urinalysis, Routine w reflex microscopic Urine, Clean Catch     Status: Abnormal   Collection Time: 02/18/21  8:36 PM  Result Value Ref Range   Color, Urine YELLOW YELLOW   APPearance CLEAR CLEAR   Specific Gravity, Urine 1.028 1.005 - 1.030   pH 5.0 5.0 - 8.0   Glucose, UA NEGATIVE NEGATIVE mg/dL   Hgb urine dipstick NEGATIVE NEGATIVE   Bilirubin Urine NEGATIVE NEGATIVE   Ketones, ur NEGATIVE NEGATIVE mg/dL   Protein, ur 30 (A) NEGATIVE mg/dL   Nitrite NEGATIVE NEGATIVE   Leukocytes,Ua NEGATIVE NEGATIVE   RBC / HPF 0-5 0 - 5 RBC/hpf   WBC, UA 0-5 0 - 5 WBC/hpf   Bacteria, UA NONE SEEN NONE SEEN  POC occult blood, ED RN will collect     Status: None   Collection Time: 02/18/21  8:49 PM  Result Value Ref Range   Fecal Occult Bld NEGATIVE NEGATIVE  Type and screen     Status: None (Preliminary result)   Collection Time: 02/18/21 10:43 PM  Result Value Ref Range   ABO/RH(D) O POS    Antibody Screen NEG    Sample Expiration 02/21/2021,2359    Unit Number  [id number]    Blood Component Type RED CELLS,LR    Unit division 00    Status of Unit ISSUED    Transfusion Status OK TO TRANSFUSE    Crossmatch Result      Compatible Performed at [facility], [place] [address] [place], [place] [address]   Prepare RBC (crossmatch)     Status: None   Collection Time: 02/18/21 10:43 PM  Result Value Ref Range   Order Confirmation      ORDER PROCESSED BY BLOOD BANK Performed at [facility], [place] [address], [place], [place] [address]   CBC     Status: Abnormal   Collection Time: 02/19/21  6:40 AM  Result Value Ref Range   WBC 13.1 (H) 4.0 - 10.5 K/uL   RBC 3.43 (L) 4.22 - 5.81 MIL/uL   Hemoglobin 7.8 (L) 13.0 - 17.0 g/dL   HCT 25.7 (L) 39.0 - 52.0 %   MCV 74.9 (L) 80.0 - 100.0 fL   MCH 22.7 (L) 26.0 - 34.0 pg   MCHC 30.4 30.0 - 36.0 g/dL   RDW 18.2 (H) 11.5 - 15.5 %   Platelets 658 (H) 150 - 400 K/uL   nRBC 0.0 0.0 - 0.2 %  Comprehensive metabolic panel  Status: Abnormal   Collection Time: 02/19/21  6:40 AM  Result Value Ref Range   Sodium 133 (L) 135 - 145 mmol/L   Potassium 4.2 3.5 - 5.1 mmol/L   Chloride 103 98 - 111 mmol/L   CO2 20 (L) 22 - 32 mmol/L   Glucose, Bld 93 70 - 99 mg/dL   BUN 13 8 - 23 mg/dL   Creatinine, Ser 0.92 0.61 - 1.24 mg/dL   Calcium 8.6 (L) 8.9 - 10.3 mg/dL   Total Protein 8.0 6.5 - 8.1 g/dL   Albumin 2.0 (L) 3.5 - 5.0 g/dL   AST 12 (L) 15 - 41 U/L   ALT 14 0 - 44 U/L   Alkaline Phosphatase 100 38 - 126 U/L   Total Bilirubin 0.5 0.3 - 1.2 mg/dL   GFR, Estimated >60 >60 mL/min   Anion gap 10 5 - 15  CBG monitoring, ED     Status: None   Collection Time: 02/19/21  7:54 AM  Result Value Ref Range   Glucose-Capillary 83 70 - 99 mg/dL  CBG monitoring, ED     Status: None   Collection Time: 02/19/21 11:57 AM  Result Value Ref Range   Glucose-Capillary 74 70 - 99 mg/dL   Recent Results (from the past 240 hour(s))  SARS CORONAVIRUS 2 (TAT 6-24 HRS) Nasopharyngeal  Nasopharyngeal Swab     Status: None   Collection Time: 02/18/21  6:20 PM   Specimen: Nasopharyngeal Swab  Result Value Ref Range Status   SARS Coronavirus 2 NEGATIVE NEGATIVE Final    Comment: (NOTE) SARS-CoV-2 target nucleic acids are NOT DETECTED.  The SARS-CoV-2 RNA is generally detectable in upper and lower respiratory specimens during the acute phase of infection. Negative results do not preclude SARS-CoV-2 infection, do not rule out co-infections with other pathogens, and should not be used as the sole basis for treatment or other patient management decisions. Negative results must be combined with clinical observations, patient history, and epidemiological information. The expected result is Negative.  Fact Sheet for Patients: SugarRoll.be  Fact Sheet for Healthcare Providers: https://www.woods-mathews.com/  This test is not yet approved or cleared by the [place] FDA and  has been authorized for detection and/or diagnosis of SARS-CoV-2 by FDA under an Emergency Use Authorization (EUA). This EUA will remain  in effect (meaning this test can be used) for the duration of the COVID-19 declaration under Se ction 564(b)(1) of the Act, 21 U.S.C. section 360bbb-3(b)(1), unless the authorization is terminated or revoked sooner.  Performed at [facility], [place] [address], [place],  [address]     Renal Function: Recent Labs    02/18/21 1812 02/19/21 0640  CREATININE 1.03 0.92   Estimated Creatinine Clearance: 65.7 mL/min (by C-G formula based on SCr of 0.92 mg/dL).  Radiologic Imaging: CT Head Wo Contrast  Result Date: 02/18/2021 CLINICAL DATA:  Dizziness and headache. EXAM: CT HEAD WITHOUT CONTRAST TECHNIQUE: Contiguous axial images were obtained from the base of the skull through the vertex without intravenous contrast. COMPARISON:  December 09, 2020 FINDINGS: Brain: There is mild cerebral atrophy with widening  of the extra-axial spaces and ventricular dilatation. There are areas of decreased attenuation within the white matter tracts of the supratentorial brain, consistent with microvascular disease changes. Vascular: No hyperdense vessel or unexpected calcification. Skull: Normal. Negative for fracture or focal lesion. Sinuses/Orbits: No acute finding. Other: Small, ill-defined areas of partially calcified and noncalcified soft tissue attenuation are seen scattered throughout the scalp. This  is seen on the prior exam. A 1.9 cm x 0.3 cm right frontal scalp lipoma is seen. IMPRESSION: 1. No acute intracranial findings. 2. Small right frontal scalp lipoma. Electronically Signed   By: Virgina Norfolk M.D.   On: 02/18/2021 19:48   CT ABDOMEN PELVIS W CONTRAST  Result Date: 02/18/2021 CLINICAL DATA:  Left lower quadrant pain. EXAM: CT ABDOMEN AND PELVIS WITH CONTRAST TECHNIQUE: Multidetector CT imaging of the abdomen and pelvis was performed using the standard protocol following bolus administration of intravenous contrast. CONTRAST:  13mL OMNIPAQUE IOHEXOL 300 MG/ML  SOLN COMPARISON:  November 19, 2020 FINDINGS: Lower chest: No acute abnormality. Hepatobiliary: A stable 1.3 cm x 0.8 cm focus of parenchymal low attenuation is seen within the posterior aspect of the right lobe of the liver. Ill-defined 0.8 cm x 0.6 cm and 0.8 cm x 0.7 cm hyperdense foci are also seen within this region. Additional 1.9 cm x 1.2 cm and 1.6 cm x 1.5 cm hyperdense lesions are noted within the right lobe (axial CT images 14 and 17, CT series number 2). A 3.3 cm x 3.0 cm heterogeneous low-attenuation liver mass, with surrounding hyperdense rim, is seen adjacent to the caudate lobe (axial CT image 26, CT series number 2). No gallstones, gallbladder wall thickening, or biliary dilatation. Pancreas: Unremarkable. No pancreatic ductal dilatation or surrounding inflammatory changes. Spleen: Normal in size without focal abnormality. Adrenals/Urinary  Tract: Adrenal glands are unremarkable. The right kidney is normal in size, without renal calculi, focal lesion, or hydronephrosis. Delayed renal cortical enhancement is seen on the left with mild left-sided perinephric inflammatory fat stranding. A 6.4 cm x 4.9 cm x 4.4 cm heterogeneous low-attenuation mass is seen within the lower pole of the left kidney. This is increased in size when compared to the prior study. An adjacent 5.5 cm x 3.9 cm x 3.6 cm area of heterogeneous attenuation is seen along the lower pole of the left kidney. There is marked severity left-sided hydronephrosis and proximal hydroureter. The lumen of the proximal left ureter is filled with an area of heterogeneous attenuation which extends inferiorly into the previously described area along the lower pole. Stomach/Bowel: Stomach is within normal limits. Appendix appears normal. No evidence of bowel wall thickening, distention, or inflammatory changes. Vascular/Lymphatic: Aortic atherosclerosis. No enlarged abdominal or pelvic lymph nodes. Reproductive: There is moderate to marked severity prostate gland enlargement. Other: No abdominal wall hernia or abnormality. No abdominopelvic ascites. Musculoskeletal: There is 2 mm to 3 mm retrolisthesis of the L5 vertebral body on S1. IMPRESSION: 1. Large mass within the lower pole of the left kidney with an adjacent chronic perinephric hematoma. 2. Extrarenal extension of the previously noted left renal mass with invasion into the proximal left ureter and subsequent partial obstruction of the left kidney. Further evaluation with MRI is recommended. 3. Findings consistent with multiple metastatic lesions within the liver. 4. Enlarged prostate gland. 5. 2 mm to 3 mm retrolisthesis of the L5 vertebral body on S1. 6. Aortic atherosclerosis. Aortic Atherosclerosis (ICD10-I70.0). Electronically Signed   By: Virgina Norfolk M.D.   On: 02/18/2021 20:27   DG Chest Port 1 View  Result Date:  02/18/2021 CLINICAL DATA:  Question sepsis. EXAM: PORTABLE CHEST 1 VIEW COMPARISON:  11/18/2020 FINDINGS: The heart size and mediastinal contours are within normal limits. Both lungs are clear. The visualized skeletal structures are unremarkable. IMPRESSION: No active disease. Electronically Signed   By: Franchot Gallo M.D.   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 Abner Greenspan and cytoreductive nephrectomy is not recommended in this setting. He is also being treated and followed by Dr. Alen Blew who will discuss treatment options going forward with the pt after discharge.   Left hydronephrosis--due to renal tumor. Ureter is not completely obstructed and Cr is normal.  No intervention is necessary at this time.  Follow Cr and re-image as needed. If pt develops obstruction with pain and/or renal failure stenting may be indicated. He can continue to be followed as an outpatient by Dr. Abner Greenspan.   Debbrah Alar 02/19/2021, 1:12 PM

## 2021-02-19 NOTE — Progress Notes (Signed)
   02/19/21 1457  Vitals  Temp (!) 101.9 F (38.8 C)  Temp Source Oral  BP 123/80  MAP (mmHg) 94  BP Location Left Arm  BP Method Automatic  Patient Position (if appropriate) Lying  Pulse Rate 85  Resp 14  MEWS COLOR  MEWS Score Color Yellow  Oxygen Therapy  SpO2 98 %  O2 Device Room Air  Pain Assessment  Pain Scale 0-10  Pain Score 0  MEWS Score  MEWS Temp 2  MEWS Systolic 0  MEWS Pulse 0  MEWS RR 0  MEWS LOC 0  MEWS Score 2  Provider Notification  Provider Name/Title choi  Date Provider Notified 02/19/21  Time Provider Notified 1502  Notification Type Page  Notification Reason Critical result  Provider response See new orders  Date of Provider Response 02/19/21  Time of Provider Response 1503

## 2021-02-19 NOTE — ED Notes (Addendum)
Patient noted to have some leakage from IV catheter hub. RN removed Tegaderm, cleaned area, re-tightened hub and placed fresh Tegaderm over area. Patient given a pillow for comfort and door window adjusted for rest.

## 2021-02-20 DIAGNOSIS — D649 Anemia, unspecified: Secondary | ICD-10-CM | POA: Diagnosis not present

## 2021-02-20 LAB — BASIC METABOLIC PANEL
Anion gap: 12 (ref 5–15)
BUN: 11 mg/dL (ref 8–23)
CO2: 19 mmol/L — ABNORMAL LOW (ref 22–32)
Calcium: 8.7 mg/dL — ABNORMAL LOW (ref 8.9–10.3)
Chloride: 102 mmol/L (ref 98–111)
Creatinine, Ser: 0.86 mg/dL (ref 0.61–1.24)
GFR, Estimated: >60 mL/min (ref >60)
Glucose, Bld: 89 mg/dL (ref 70–99)
Potassium: 4.5 mmol/L (ref 3.5–5.1)
Sodium: 133 mmol/L — ABNORMAL LOW (ref 135–145)

## 2021-02-20 LAB — CBC
HCT: 26.5 % — ABNORMAL LOW (ref 39.0–52.0)
Hemoglobin: 7.9 g/dL — ABNORMAL LOW (ref 13.0–17.0)
MCH: 22.6 pg — ABNORMAL LOW (ref 26.0–34.0)
MCHC: 29.8 g/dL — ABNORMAL LOW (ref 30.0–36.0)
MCV: 75.7 fL — ABNORMAL LOW (ref 80.0–100.0)
Platelets: 633 10*3/uL — ABNORMAL HIGH (ref 150–400)
RBC: 3.5 MIL/uL — ABNORMAL LOW (ref 4.22–5.81)
RDW: 18.3 % — ABNORMAL HIGH (ref 11.5–15.5)
WBC: 11.7 10*3/uL — ABNORMAL HIGH (ref 4.0–10.5)
nRBC: 0 % (ref 0.0–0.2)

## 2021-02-20 LAB — URINE CULTURE

## 2021-02-20 LAB — BPAM RBC
Blood Product Expiration Date: 202204082359
ISSUE DATE / TIME: 202203100027
Unit Type and Rh: 5100

## 2021-02-20 LAB — TYPE AND SCREEN
ABO/RH(D): O POS
Antibody Screen: NEGATIVE
Unit division: 0

## 2021-02-20 LAB — GLUCOSE, CAPILLARY: Glucose-Capillary: 83 mg/dL (ref 70–99)

## 2021-02-20 NOTE — Discharge Summary (Signed)
Physician Discharge Summary  Chad Cantu ZOX:[id number] DOB: [birth year] DOA: 02/18/2021  PCP: [name], MD  Admit date: 02/18/2021 Discharge date: 02/20/2021  Admitted From: Home Disposition:  Home  Recommendations for Outpatient Follow-up:  Follow up with Dr. Alen Blew and Dr. Abner Greenspan as scheduled   Discharge Condition: Stable CODE STATUS: Full  Diet recommendation: Regular   Brief/Interim Summary: Chad Cantu a 70 y.o.malewith medical history significant ofalcohol use, hypertension, hyperlipidemia, memory deficit, diabetes, metastatic renal cell carcinoma who presents with dizziness and left lower quadrant pain for 1 week.He notes that he has dizziness that is worse with position change including from sitting to standing.He also reports left lower quadrant pain and some diarrhea as well as increased weakness and fatigue. He describes the diarrhea as a single episode of loose stool not multiple episodes. He has had intermittent fevers since starting chemo treatment in December. He denies chest pain.Reports fatigue and increased feelings of weakness as above.  CT abdomen pelvis revealed large mass in the lower pole of his left kidney which is known also showed apparent extension with protrusion into the left ureter with partial obstruction of the left kidney.  Patient was admitted with symptomatic anemia, given 1 unit packed red blood cells.  He was evaluated during hospitalization by oncology and urology. His kidney function and urine output remained stable, hemoglobin stable. WBC continued to improve without antibiotics. He had intermittent fevers, which were attributed to malignancy. Orthostatic vital signs were stable.   Discharge Diagnoses:  Principal Problem:   Symptomatic anemia Active Problems:   Essential hypertension   HLD (hyperlipidemia)   Metastatic renal cell carcinoma (HCC)   Leukocytosis   Symptomatic anemia -Transfused 1 unit packed red blood cell in  the emergency department -In setting of immunotherapy for RCC -FOBT negative -Continue ferrous sulfate -Trend hemoglobin -Orthostatic vital sign negative -Improved in symptoms   Metastatic renal cell carcinoma -CT abdomen pelvis revealed large mass within the lower pole of the left kidney with an adjacent chronic perinephric hematoma. Extrarenal extension of the previously noted left renal mass with invasion into the proximal left ureter and subsequent partial obstruction of the left kidney. -Appreciate oncology Dr. Alen Blew and Dr. Junious Silk   SIRS -Presented with fever 100.5, HR 126, RR 20, WBC 15.1 -No sign of infectious process at this time, monitor off antibiotics for now.  He did receive empiric Rocephin, Flagyl in the emergency department -UA unremarkable -Blood culture negative so far -Likely due to malignancy. SEPSIS RULED OUT.    Hyperlipidemia -Continue Lipitor    Discharge Instructions  Discharge Instructions    Call MD for:  difficulty breathing, headache or visual disturbances   Complete by: As directed    Call MD for:  extreme fatigue   Complete by: As directed    Call MD for:  persistant dizziness or light-headedness   Complete by: As directed    Call MD for:  persistant nausea and vomiting   Complete by: As directed    Call MD for:  severe uncontrolled pain   Complete by: As directed    Call MD for:  temperature >100.4   Complete by: As directed    Discharge instructions   Complete by: As directed    You were cared for by a hospitalist during your hospital stay. If you have any questions about your discharge medications or the care you received while you were in the hospital after you are discharged, you can call the unit and ask to speak with the  hospitalist on call if the hospitalist that took care of you is not available. Once you are discharged, your primary care physician will handle any further medical issues. Please note that NO REFILLS for any  discharge medications will be authorized once you are discharged, as it is imperative that you return to your primary care physician (or establish a relationship with a primary care physician if you do not have one) for your aftercare needs so that they can reassess your need for medications and monitor your lab values.   Increase activity slowly   Complete by: As directed      Allergies as of 02/20/2021   No Known Allergies     Medication List    TAKE these medications   acetaminophen 325 MG tablet Commonly known as: TYLENOL Take 2 tablets (650 mg total) by mouth every 6 (six) hours as needed for mild pain (or Fever >/= 101).   atorvastatin 20 MG tablet Commonly known as: LIPITOR Take 1 tablet (20 mg total) by mouth daily.   ferrous sulfate 325 (65 FE) MG EC tablet Take 1 tablet (325 mg total) by mouth in the morning and at bedtime.   megestrol 40 MG/ML suspension Commonly known as: MEGACE Take 10 mLs (400 mg total) by mouth daily. What changed:   when to take this  reasons to take this   metFORMIN 500 MG tablet Commonly known as: GLUCOPHAGE Take 1 tablet (500 mg total) by mouth daily with breakfast.   VITAMIN C PO Take by mouth.       Follow-up Information    Chad Portela, MD Follow up.   Specialty: Oncology Contact information: [place] [address] [phone]        Chad Lima, MD Follow up.   Specialty: Urology Contact information: [place] [address] [phone]              No Known Allergies  Consultations:  Oncology  Urology    Procedures/Studies: CT Head Wo Contrast  Result Date: 02/18/2021 CLINICAL DATA:  Dizziness and headache. EXAM: CT HEAD WITHOUT CONTRAST TECHNIQUE: Contiguous axial images were obtained from the base of the skull through the vertex without intravenous contrast. COMPARISON:  December 09, 2020 FINDINGS: Brain: There is mild cerebral atrophy with widening of the  extra-axial spaces and ventricular dilatation. There are areas of decreased attenuation within the white matter tracts of the supratentorial brain, consistent with microvascular disease changes. Vascular: No hyperdense vessel or unexpected calcification. Skull: Normal. Negative for fracture or focal lesion. Sinuses/Orbits: No acute finding. Other: Small, ill-defined areas of partially calcified and noncalcified soft tissue attenuation are seen scattered throughout the scalp. This is seen on the prior exam. A 1.9 cm x 0.3 cm right frontal scalp lipoma is seen. IMPRESSION: 1. No acute intracranial findings. 2. Small right frontal scalp lipoma. Electronically Signed   By: Virgina Norfolk M.D.   On: 02/18/2021 19:48   CT ABDOMEN PELVIS W CONTRAST  Result Date: 02/18/2021 CLINICAL DATA:  Left lower quadrant pain. EXAM: CT ABDOMEN AND PELVIS WITH CONTRAST TECHNIQUE: Multidetector CT imaging of the abdomen and pelvis was performed using the standard protocol following bolus administration of intravenous contrast. CONTRAST:  168mL OMNIPAQUE IOHEXOL 300 MG/ML  SOLN COMPARISON:  November 19, 2020 FINDINGS: Lower chest: No acute abnormality. Hepatobiliary: A stable 1.3 cm x 0.8 cm focus of parenchymal low attenuation is seen within the posterior aspect of the right lobe of the liver. Ill-defined  0.8 cm x 0.6 cm and 0.8 cm x 0.7 cm hyperdense foci are also seen within this region. Additional 1.9 cm x 1.2 cm and 1.6 cm x 1.5 cm hyperdense lesions are noted within the right lobe (axial CT images 14 and 17, CT series number 2). A 3.3 cm x 3.0 cm heterogeneous low-attenuation liver mass, with surrounding hyperdense rim, is seen adjacent to the caudate lobe (axial CT image 26, CT series number 2). No gallstones, gallbladder wall thickening, or biliary dilatation. Pancreas: Unremarkable. No pancreatic ductal dilatation or surrounding inflammatory changes. Spleen: Normal in size without focal abnormality. Adrenals/Urinary Tract:  Adrenal glands are unremarkable. The right kidney is normal in size, without renal calculi, focal lesion, or hydronephrosis. Delayed renal cortical enhancement is seen on the left with mild left-sided perinephric inflammatory fat stranding. A 6.4 cm x 4.9 cm x 4.4 cm heterogeneous low-attenuation mass is seen within the lower pole of the left kidney. This is increased in size when compared to the prior study. An adjacent 5.5 cm x 3.9 cm x 3.6 cm area of heterogeneous attenuation is seen along the lower pole of the left kidney. There is marked severity left-sided hydronephrosis and proximal hydroureter. The lumen of the proximal left ureter is filled with an area of heterogeneous attenuation which extends inferiorly into the previously described area along the lower pole. Stomach/Bowel: Stomach is within normal limits. Appendix appears normal. No evidence of bowel wall thickening, distention, or inflammatory changes. Vascular/Lymphatic: Aortic atherosclerosis. No enlarged abdominal or pelvic lymph nodes. Reproductive: There is moderate to marked severity prostate gland enlargement. Other: No abdominal wall hernia or abnormality. No abdominopelvic ascites. Musculoskeletal: There is 2 mm to 3 mm retrolisthesis of the L5 vertebral body on S1. IMPRESSION: 1. Large mass within the lower pole of the left kidney with an adjacent chronic perinephric hematoma. 2. Extrarenal extension of the previously noted left renal mass with invasion into the proximal left ureter and subsequent partial obstruction of the left kidney. Further evaluation with MRI is recommended. 3. Findings consistent with multiple metastatic lesions within the liver. 4. Enlarged prostate gland. 5. 2 mm to 3 mm retrolisthesis of the L5 vertebral body on S1. 6. Aortic atherosclerosis. Aortic Atherosclerosis (ICD10-I70.0). Electronically Signed   By: Virgina Norfolk M.D.   On: 02/18/2021 20:27   DG Chest Port 1 View  Result Date: 02/18/2021 CLINICAL DATA:   Question sepsis. EXAM: PORTABLE CHEST 1 VIEW COMPARISON:  11/18/2020 FINDINGS: The heart size and mediastinal contours are within normal limits. Both lungs are clear. The visualized skeletal structures are unremarkable. IMPRESSION: No active disease. Electronically Signed   By: Franchot Gallo M.D.   On: 02/18/2021 18:51       Discharge Exam: Vitals:   02/20/21 0749 02/20/21 0750  BP: 113/80 116/71  Pulse: 85 98  Resp:    Temp:    SpO2: 98% 99%    General: Pt is alert, awake, not in acute distress Cardiovascular: RRR, S1/S2 +, no edema Respiratory: CTA bilaterally, no wheezing, no rhonchi, no respiratory distress, no conversational dyspnea  Abdominal: Soft, NT, ND, bowel sounds + Extremities: no edema, no cyanosis Psych: Normal mood and affect, stable judgement and insight     The results of significant diagnostics from this hospitalization (including imaging, microbiology, ancillary and laboratory) are listed below for reference.     Microbiology: Recent Results (from the past 240 hour(s))  Blood culture (routine single)     Status: None (Preliminary result)   Collection Time: 02/18/21  6:13 PM   Specimen: BLOOD  Result Value Ref Range Status   Specimen Description   Final    BLOOD RIGHT ANTECUBITAL Performed at [facility] [address] [place], [place] [address]    Special Requests   Final    BOTTLES DRAWN AEROBIC AND ANAEROBIC Blood Culture adequate volume Performed at [facility] [address], [place], [place] [address]    Culture   Final    NO GROWTH < 24 HOURS Performed at [facility] [address] [place], [place] [address]    Report Status PENDING  Incomplete  SARS CORONAVIRUS 2 (TAT 6-24 HRS) Nasopharyngeal Nasopharyngeal Swab     Status: None   Collection Time: 02/18/21  6:20 PM   Specimen: Nasopharyngeal Swab  Result Value Ref Range Status   SARS Coronavirus 2 NEGATIVE NEGATIVE Final    Comment:  (NOTE) SARS-CoV-2 target nucleic acids are NOT DETECTED.  The SARS-CoV-2 RNA is generally detectable in upper and lower respiratory specimens during the acute phase of infection. Negative results do not preclude SARS-CoV-2 infection, do not rule out co-infections with other pathogens, and should not be used as the sole basis for treatment or other patient management decisions. Negative results must be combined with clinical observations, patient history, and epidemiological information. The expected result is Negative.  Fact Sheet for Patients: SugarRoll.be  Fact Sheet for Healthcare Providers: https://www.woods-mathews.com/  This test is not yet approved or cleared by the [place] FDA and  has been authorized for detection and/or diagnosis of SARS-CoV-2 by FDA under an Emergency Use Authorization (EUA). This EUA will remain  in effect (meaning this test can be used) for the duration of the COVID-19 declaration under Se ction 564(b)(1) of the Act, 21 U.S.C. section 360bbb-3(b)(1), unless the authorization is terminated or revoked sooner.  Performed at [facility], [place] [address], [place], [place] [address]   Urine culture     Status: Abnormal   Collection Time: 02/18/21  8:36 PM   Specimen: In/Out Cath Urine  Result Value Ref Range Status   Specimen Description   Final    IN/OUT CATH URINE Performed at [facility] [address] [place], [place] [address]    Special Requests   Final    NONE Performed at [facility], [place] [address] [place],  [address]    Culture MULTIPLE SPECIES PRESENT, SUGGEST RECOLLECTION (A)  Final   Report Status 02/20/2021 FINAL  Final     Labs: BNP (last 3 results) No results for input(s): BNP in the last 8760 hours. Basic Metabolic Panel: Recent Labs  Lab 02/18/21 1812 02/19/21 0640 02/20/21 0553  NA 139 133* 133*  K 4.1 4.2 4.5  CL 109  103 102  CO2 20* 20* 19*  GLUCOSE 108* 93 89  BUN 13 13 11   CREATININE 1.03 0.92 0.86  CALCIUM 9.0 8.6* 8.7*   Liver Function Tests: Recent Labs  Lab 02/18/21 1812 02/19/21 0640  AST 15 12*  ALT 16 14  ALKPHOS 108 100  BILITOT 0.3 0.5  PROT 8.3* 8.0  ALBUMIN 2.3* 2.0*   No results for input(s): LIPASE, AMYLASE in the last 168 hours. No results for input(s): AMMONIA in the last 168 hours. CBC: Recent Labs  Lab 02/18/21 1812 02/19/21 0640 02/20/21 0553  WBC 15.1* 13.1* 11.7*  NEUTROABS 9.6*  --   --   HGB 7.0* 7.8* 7.9*  HCT 23.5* 25.7* 26.5*  MCV 75.6* 74.9* 75.7*  PLT 721* 658* 633*   Cardiac Enzymes: No results for input(s): CKTOTAL, CKMB, CKMBINDEX, TROPONINI in the last 168 hours. BNP: Invalid input(s): POCBNP CBG: Recent Labs  Lab 02/19/21 0754 02/19/21 1157 02/19/21 1739 02/19/21 2130 02/20/21 0709  GLUCAP 83 74 152* 100* 83   D-Dimer No results for input(s): DDIMER in the last 72 hours. Hgb A1c No results for input(s): HGBA1C in the last 72 hours. Lipid Profile No results for input(s): CHOL, HDL, LDLCALC, TRIG, CHOLHDL, LDLDIRECT in the last 72 hours. Thyroid function studies No results for input(s): TSH, T4TOTAL, T3FREE, THYROIDAB in the last 72 hours.  Invalid input(s): FREET3 Anemia work up No results for input(s): VITAMINB12, FOLATE, FERRITIN, TIBC, IRON, RETICCTPCT in the last 72 hours. Urinalysis    Component Value Date/Time   COLORURINE YELLOW 02/18/2021 2036   APPEARANCEUR CLEAR 02/18/2021 2036   APPEARANCEUR Clear 05/02/2017 1702   LABSPEC 1.028 02/18/2021 2036   PHURINE 5.0 02/18/2021 2036   GLUCOSEU NEGATIVE 02/18/2021 2036   HGBUR NEGATIVE 02/18/2021 2036   BILIRUBINUR NEGATIVE 02/18/2021 2036   BILIRUBINUR Negative 05/02/2017 1702   KETONESUR NEGATIVE 02/18/2021 2036   PROTEINUR 30 (A) 02/18/2021 2036   NITRITE NEGATIVE 02/18/2021 2036   LEUKOCYTESUR NEGATIVE 02/18/2021 2036   Sepsis Labs Invalid input(s): PROCALCITONIN,   WBC,  LACTICIDVEN Microbiology Recent Results (from the past 240 hour(s))  Blood culture (routine single)     Status: None (Preliminary result)   Collection Time: 02/18/21  6:13 PM   Specimen: BLOOD  Result Value Ref Range Status   Specimen Description   Final    BLOOD RIGHT ANTECUBITAL Performed at [facility], [place] [address] [place], [place] [address]    Special Requests   Final    BOTTLES DRAWN AEROBIC AND ANAEROBIC Blood Culture adequate volume Performed at [facility] [address] [place], [place] [address]    Culture   Final    NO GROWTH < 24 HOURS Performed at [facility] [address], [place], [place] [address]    Report Status PENDING  Incomplete  SARS CORONAVIRUS 2 (TAT 6-24 HRS) Nasopharyngeal Nasopharyngeal Swab     Status: None   Collection Time: 02/18/21  6:20 PM   Specimen: Nasopharyngeal Swab  Result Value Ref Range Status   SARS Coronavirus 2 NEGATIVE NEGATIVE Final    Comment: (NOTE) SARS-CoV-2 target nucleic acids are NOT DETECTED.  The SARS-CoV-2 RNA is generally detectable in upper and lower respiratory specimens during the acute phase of infection. Negative results do not preclude SARS-CoV-2 infection, do not rule out co-infections with other pathogens, and should not be used as the sole basis for treatment or other patient management decisions. Negative results must be combined with clinical observations, patient history, and epidemiological information. The expected result is Negative.  Fact Sheet for Patients: SugarRoll.be  Fact Sheet for Healthcare Providers: https://www.woods-mathews.com/  This test is not yet approved or cleared by the [place] FDA and  has been authorized for detection and/or diagnosis of SARS-CoV-2 by FDA under an Emergency Use Authorization (EUA). This EUA will remain  in effect (meaning this test can be used) for the  duration of the COVID-19 declaration under Se ction 564(b)(1) of the Act, 21 U.S.C. section 360bbb-3(b)(1), unless the authorization is terminated or revoked sooner.  Performed at [facility], [place] [address], [place], [place] [address]   Urine culture     Status: Abnormal   Collection  Time: 02/18/21  8:36 PM   Specimen: In/Out Cath Urine  Result Value Ref Range Status   Specimen Description   Final    IN/OUT CATH URINE Performed at [facility], [place] [address], [place], [place] [address]    Special Requests   Final    NONE Performed at [facility], [place] [address] [place], [place] [address]    Culture MULTIPLE SPECIES PRESENT, SUGGEST RECOLLECTION (A)  Final   Report Status 02/20/2021 FINAL  Final     Patient was seen and examined on the day of discharge and was found to be in stable condition. Time coordinating discharge: 20 minutes including assessment and coordination of care, as well as examination of the patient.   SIGNED:  Dessa Phi, DO [facility] 02/20/2021, 8:14 AM

## 2021-02-23 ENCOUNTER — Telehealth: Payer: Self-pay

## 2021-02-23 NOTE — Telephone Encounter (Signed)
Transition Care Management Follow-up Telephone Call  Date of discharge and from where: 02/20/2021, [facility]  How have you been since you were released from the hospital? He said he is doing all right, just tired  Any questions or concerns? No  Items Reviewed:  Did the pt receive and understand the discharge instructions provided? Yes   Medications obtained and verified? Yes , he did not have any questions about the med regime and said that his wife manages his medications.   Other? No   Any new allergies since your discharge? No   Do you have support at home? Yes , his wife  [place] and Equipment/Supplies: Were home health services ordered? no If so, what is the name of the agency? n/a  Has the agency set up a time to come to the patient's home? not applicable Were any new equipment or medical supplies ordered?  No What is the name of the medical supply agency? n/a Were you able to get the supplies/equipment? not applicable Do you have any questions related to the use of the equipment or supplies? No  Functional Questionnaire: (I = Independent and D = Dependent) ADLs: Indpendent   Follow up appointments reviewed:   PCP Hospital f/u appt confirmed? Yes  - scheduled an appointment with Dr Wynetta Emery 03/09/2021.   [facility] f/u appt confirmed? Yes , oncology - 02/25/2021.   Are transportation arrangements needed? No   If their condition worsens, is the pt aware to call PCP or go to the Emergency Dept.? Yes  Was the patient provided with contact information for the PCP's office or ED? Yes  Was to pt encouraged to call back with questions or concerns? Yes

## 2021-02-24 LAB — CULTURE, BLOOD (SINGLE)
Culture: NO GROWTH
Special Requests: ADEQUATE

## 2021-02-25 ENCOUNTER — Telehealth: Payer: Self-pay

## 2021-02-25 ENCOUNTER — Other Ambulatory Visit: Payer: Self-pay

## 2021-02-25 ENCOUNTER — Inpatient Hospital Stay: Payer: Medicare Other | Attending: Oncology

## 2021-02-25 ENCOUNTER — Inpatient Hospital Stay: Payer: Medicare Other

## 2021-02-25 ENCOUNTER — Inpatient Hospital Stay (HOSPITAL_BASED_OUTPATIENT_CLINIC_OR_DEPARTMENT_OTHER): Payer: Medicare Other | Admitting: Oncology

## 2021-02-25 ENCOUNTER — Telehealth: Payer: Self-pay | Admitting: Pharmacist

## 2021-02-25 ENCOUNTER — Other Ambulatory Visit: Payer: Self-pay | Admitting: Oncology

## 2021-02-25 VITALS — BP 91/69 | HR 107 | Temp 97.8°F | Resp 18 | Ht 66.0 in | Wt 123.0 lb

## 2021-02-25 DIAGNOSIS — D500 Iron deficiency anemia secondary to blood loss (chronic): Secondary | ICD-10-CM

## 2021-02-25 DIAGNOSIS — E860 Dehydration: Secondary | ICD-10-CM | POA: Diagnosis not present

## 2021-02-25 DIAGNOSIS — R109 Unspecified abdominal pain: Secondary | ICD-10-CM | POA: Insufficient documentation

## 2021-02-25 DIAGNOSIS — R509 Fever, unspecified: Secondary | ICD-10-CM | POA: Insufficient documentation

## 2021-02-25 DIAGNOSIS — D649 Anemia, unspecified: Secondary | ICD-10-CM | POA: Insufficient documentation

## 2021-02-25 DIAGNOSIS — C7800 Secondary malignant neoplasm of unspecified lung: Secondary | ICD-10-CM | POA: Insufficient documentation

## 2021-02-25 DIAGNOSIS — C641 Malignant neoplasm of right kidney, except renal pelvis: Secondary | ICD-10-CM

## 2021-02-25 DIAGNOSIS — Z7984 Long term (current) use of oral hypoglycemic drugs: Secondary | ICD-10-CM | POA: Diagnosis not present

## 2021-02-25 DIAGNOSIS — N1330 Unspecified hydronephrosis: Secondary | ICD-10-CM | POA: Insufficient documentation

## 2021-02-25 DIAGNOSIS — C642 Malignant neoplasm of left kidney, except renal pelvis: Secondary | ICD-10-CM | POA: Diagnosis not present

## 2021-02-25 DIAGNOSIS — Z79899 Other long term (current) drug therapy: Secondary | ICD-10-CM | POA: Insufficient documentation

## 2021-02-25 DIAGNOSIS — N400 Enlarged prostate without lower urinary tract symptoms: Secondary | ICD-10-CM | POA: Insufficient documentation

## 2021-02-25 DIAGNOSIS — I959 Hypotension, unspecified: Secondary | ICD-10-CM | POA: Diagnosis not present

## 2021-02-25 DIAGNOSIS — C649 Malignant neoplasm of unspecified kidney, except renal pelvis: Secondary | ICD-10-CM

## 2021-02-25 DIAGNOSIS — C787 Secondary malignant neoplasm of liver and intrahepatic bile duct: Secondary | ICD-10-CM | POA: Diagnosis present

## 2021-02-25 DIAGNOSIS — E032 Hypothyroidism due to medicaments and other exogenous substances: Secondary | ICD-10-CM

## 2021-02-25 LAB — CMP (CANCER CENTER ONLY)
ALT: 15 U/L (ref 0–44)
AST: 13 U/L — ABNORMAL LOW (ref 15–41)
Albumin: 1.8 g/dL — ABNORMAL LOW (ref 3.5–5.0)
Alkaline Phosphatase: 127 U/L — ABNORMAL HIGH (ref 38–126)
Anion gap: 11 (ref 5–15)
BUN: 13 mg/dL (ref 8–23)
CO2: 19 mmol/L — ABNORMAL LOW (ref 22–32)
Calcium: 10.1 mg/dL (ref 8.9–10.3)
Chloride: 105 mmol/L (ref 98–111)
Creatinine: 0.98 mg/dL (ref 0.61–1.24)
GFR, Estimated: >60 mL/min (ref >60)
Glucose, Bld: 97 mg/dL (ref 70–99)
Potassium: 4.3 mmol/L (ref 3.5–5.1)
Sodium: 135 mmol/L (ref 135–145)
Total Bilirubin: 0.3 mg/dL (ref 0.3–1.2)
Total Protein: 8.9 g/dL — ABNORMAL HIGH (ref 6.5–8.1)

## 2021-02-25 LAB — CBC WITH DIFFERENTIAL (CANCER CENTER ONLY)
Abs Immature Granulocytes: 0.14 10*3/uL — ABNORMAL HIGH (ref 0.00–0.07)
Basophils Absolute: 0.1 10*3/uL (ref 0.0–0.1)
Basophils Relative: 1 %
Eosinophils Absolute: 0.2 10*3/uL (ref 0.0–0.5)
Eosinophils Relative: 1 %
HCT: 27.4 % — ABNORMAL LOW (ref 39.0–52.0)
Hemoglobin: 8.4 g/dL — ABNORMAL LOW (ref 13.0–17.0)
Immature Granulocytes: 1 %
Lymphocytes Relative: 24 %
Lymphs Abs: 3.7 10*3/uL (ref 0.7–4.0)
MCH: 22.3 pg — ABNORMAL LOW (ref 26.0–34.0)
MCHC: 30.7 g/dL (ref 30.0–36.0)
MCV: 72.9 fL — ABNORMAL LOW (ref 80.0–100.0)
Monocytes Absolute: 1.6 10*3/uL — ABNORMAL HIGH (ref 0.1–1.0)
Monocytes Relative: 10 %
Neutro Abs: 10.2 10*3/uL — ABNORMAL HIGH (ref 1.7–7.7)
Neutrophils Relative %: 63 %
Platelet Count: 720 10*3/uL — ABNORMAL HIGH (ref 150–400)
RBC: 3.76 MIL/uL — ABNORMAL LOW (ref 4.22–5.81)
RDW: 18.4 % — ABNORMAL HIGH (ref 11.5–15.5)
WBC Count: 15.9 10*3/uL — ABNORMAL HIGH (ref 4.0–10.5)
nRBC: 0 % (ref 0.0–0.2)

## 2021-02-25 LAB — TSH: TSH: 1.878 u[IU]/mL (ref 0.320–4.118)

## 2021-02-25 MED ORDER — SODIUM CHLORIDE 0.9 % IV SOLN
Freq: Once | INTRAVENOUS | Status: AC
  Filled 2021-02-25: qty 250

## 2021-02-25 MED ORDER — CABOMETYX 40 MG PO TABS: 40.0000 mg | ORAL_TABLET | Freq: Every day | ORAL | 0 refills | Status: DC

## 2021-02-25 NOTE — Patient Instructions (Signed)
Rehydration, Adult Rehydration is the replacement of body fluids, salts, and minerals (electrolytes) that are lost during dehydration. Dehydration is when there is not enough water or other fluids in the body. This happens when you lose more fluids than you take in. Common causes of dehydration include:  Not drinking enough fluids. This can occur when you are ill or doing activities that require a lot of energy, especially in hot weather.  Conditions that cause loss of water or other fluids, such as diarrhea, vomiting, sweating, or urinating a lot.  Other illnesses, such as fever or infection.  Certain medicines, such as those that remove excess fluid from the body (diuretics). Symptoms of mild or moderate dehydration may include thirst, dry lips and mouth, and dizziness. Symptoms of severe dehydration may include increased heart rate, confusion, fainting, and not urinating. For severe dehydration, you may need to get fluids through an IV at the hospital. For mild or moderate dehydration, you can usually rehydrate at home by drinking certain fluids as told by your health care provider. What are the risks? Generally, rehydration is safe. However, taking in too much fluid (overhydration) can be a problem. This is rare. Overhydration can cause an electrolyte imbalance, kidney failure, or a decrease in salt (sodium) levels in the body. Supplies needed You will need an oral rehydration solution (ORS) if your health care provider tells you to use one. This is a drink to treat dehydration. It can be found in pharmacies and retail stores. How to rehydrate Fluids Follow instructions from your health care provider for rehydration. The kind of fluid and the amount you should drink depend on your condition. In general, you should choose drinks that you prefer.  If told by your health care provider, drink an ORS. ? Make an ORS by following instructions on the package. ? Start by drinking small amounts,  about  cup (120 mL) every 5-10 minutes. ? Slowly increase how much you drink until you have taken the amount recommended by your health care provider.  Drink enough clear fluids to keep your urine pale yellow. If you were told to drink an ORS, finish it first, then start slowly drinking other clear fluids. Drink fluids such as: ? Water. This includes sparkling water and flavored water. Drinking only water can lead to having too little sodium in your body (hyponatremia). Follow the advice of your health care provider. ? Water from ice chips you suck on. ? Fruit juice with water you add to it (diluted). ? Sports drinks. ? Hot or cold herbal teas. ? Broth-based soups. ? Milk or milk products. Food Follow instructions from your health care provider about what to eat while you rehydrate. Your health care provider may recommend that you slowly begin eating regular foods in small amounts.  Eat foods that contain a healthy balance of electrolytes, such as bananas, oranges, potatoes, tomatoes, and spinach.  Avoid foods that are greasy or contain a lot of sugar. In some cases, you may get nutrition through a feeding tube that is passed through your nose and into your stomach (nasogastric tube, or NG tube). This may be done if you have uncontrolled vomiting or diarrhea.   Beverages to avoid Certain beverages may make dehydration worse. While you rehydrate, avoid drinking alcohol.   How to tell if you are recovering from dehydration You may be recovering from dehydration if:  You are urinating more often than before you started rehydrating.  Your urine is pale yellow.  Your energy level   improves.  You vomit less frequently.  You have diarrhea less frequently.  Your appetite improves or returns to normal.  You feel less dizzy or less light-headed.  Your skin tone and color start to look more normal. Follow these instructions at home:  Take over-the-counter and prescription medicines only  as told by your health care provider.  Do not take sodium tablets. Doing this can lead to having too much sodium in your body (hypernatremia). Contact a health care provider if:  You continue to have symptoms of mild or moderate dehydration, such as: ? Thirst. ? Dry lips. ? Slightly dry mouth. ? Dizziness. ? Dark urine or less urine than normal. ? Muscle cramps.  You continue to vomit or have diarrhea. Get help right away if you:  Have symptoms of dehydration that get worse.  Have a fever.  Have a severe headache.  Have been vomiting and the following happens: ? Your vomiting gets worse or does not go away. ? Your vomit includes blood or green matter (bile). ? You cannot eat or drink without vomiting.  Have problems with urination or bowel movements, such as: ? Diarrhea that gets worse or does not go away. ? Blood in your stool (feces). This may cause stool to look black and tarry. ? Not urinating, or urinating only a small amount of very dark urine, within 6-8 hours.  Have trouble breathing.  Have symptoms that get worse with treatment. These symptoms may represent a serious problem that is an emergency. Do not wait to see if the symptoms will go away. Get medical help right away. Call your local emergency services (911 in the [place] Do not drive yourself to the hospital. Summary  Rehydration is the replacement of body fluids and minerals (electrolytes) that are lost during dehydration.  Follow instructions from your health care provider for rehydration. The kind of fluid and amount you should drink depend on your condition.  Slowly increase how much you drink until you have taken the amount recommended by your health care provider.  Contact your health care provider if you continue to show signs of mild or moderate dehydration. This information is not intended to replace advice given to you by your health care provider. Make sure you discuss any questions you have with  your health care provider. Document Revised: 01/30/2020 Document Reviewed: 12/10/2019 Elsevier Patient Education  2021 Elsevier Inc.  

## 2021-02-25 NOTE — Progress Notes (Signed)
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  Followed by Dr Chad Cantu.  Met with patient in infusion.  Patient reports that his appetite is up and down.  Eating english muffin with egg, cheese and bacon.  Eating about half of it.  Reports that he is just not hungry. Denies nausea.  Reports bowels are moving normally.  Says that he is taking megace.  Typically eats cereal for breakfast or oatmeal.  Likes tomato juice and yogurt.  Drinks ensure/boost shakes but not every day.  Last night ate cheeseburger for dinner that wife prepared.      Medications: megace  Labs: reviewed  Anthropometrics:   Weight 123 lb today decreased from 131 lb 4.8 oz on 2/21 12/29 160 lb   NUTRITION DIAGNOSIS: Inadequate oral intake continues with weight loss   INTERVENTION:  Discussed ways to increase calories and protein with patient.  Provided handout on snack choices and calorie amount. Also encouraged adding gravies, sauces, condiments for additional calories to stop weight from decreasing. Encouraged 350 calories shake or higher at least 1 time per day Continue megace.    MONITORING, EVALUATION, GOAL: weight trends, intake   NEXT VISIT: Tuesday, April 5th during infusion  Chad Cantu Chad Cantu, Chad Cantu, Chad Cantu Registered Dietitian [phone] (mobile)

## 2021-02-25 NOTE — Telephone Encounter (Addendum)
Oral Oncology Pharmacist Encounter  Received new prescription for Cabometyx (cabozantinib) for the treatment of stage IV clear-cell renal cell carcinoma, planned duration until disease progression or unacceptable drug toxicity.  Prescription dose and frequency assessed for appropriateness. Appropriate for therapy initiation.   CMP and CBC w/ Diff from 02/25/21 assessed, noted Hgb of 8.4 g/dL - recommend close monitoring after Cabometyx initiation. Corrected calcium ~11.8 mg/dL, Cabometyx can cause decrease in calcium while on therapy, so no concerns for Cabometyx initiation. BP 91/69 mmHg on 02/25/21 - may increase with Cabometyx initiation.   Current medication list in Epic reviewed, no relevant/significant DDIs with Cabometyx identified.  Evaluated chart and no patient barriers to medication adherence noted.   Patient agreement for Cabometyx treatment documented in MD note on 02/25/21.  Patient's insurance requires Cabometyx be filled through [facility]. Prescription redirected to [facility] for dispensing.   Oral Oncology Clinic will continue to follow for insurance authorization, copayment issues, initial counseling and start date.  Leron Croak, PharmD, BCPS Hematology/Oncology Clinical Pharmacist [facility] [phone] 02/25/2021 9:20 AM

## 2021-02-25 NOTE — Telephone Encounter (Signed)
Oral Oncology Patient Advocate Encounter  Prior Authorization for Cabometyx has been approved.    PA# [id number] Effective dates: 01/26/21 through 02/25/22  Patient must fill at [facility] will continue to follow.    Chad Cantu Patient [place] Phone [phone] Fax [phone] 02/25/2021 9:20 AM

## 2021-02-25 NOTE — Progress Notes (Signed)
Hematology and Oncology Follow Up Visit  Chad Cantu [id number] 25-Jul-1951 70 y.o. 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 ory.   Prior Therapy:  He is status post ultrasound biopsy of the hepatic lesion on December 01, 2020.  The final pathology confirmed the presence of metastatic clear-cell renal cell carcinoma.  Current therapy: Ipilimumab 1 mg/kg and nivolumab 3 mg/kg therapy is started on December 23, 2019.  He is receiving this regimen every 3 weeks.  He is here for evaluation prior to cycle 4.  Interim History: Mr. Chad Cantu presents today for return evaluation.  Since the last visit, he was hospitalized between March 9 and March 11 for symptoms of abdominal pain and fever.  He was also noted to be anemic with a hemoglobin of 7.0.  He received 1 unit of packed red cell transfusion and supportive care management.  Clinically, he reports he is still overall weak and dizzy at times.  He denies any vomiting, diarrhea or nominal pain.  He still has some low-grade fevers and sweats at times.     Medications: Unchanged on review. Current Outpatient Medications  Medication Sig Dispense Refill  . acetaminophen (TYLENOL) 325 MG tablet Take 2 tablets (650 mg total) by mouth every 6 (six) hours as needed for mild pain (or Fever >/= 101).    . Ascorbic Acid (VITAMIN C PO) Take by mouth.    Marland Kitchen atorvastatin (LIPITOR) 20 MG tablet Take 1 tablet (20 mg total) by mouth daily. 90 tablet 3  . ferrous sulfate 325 (65 FE) MG EC tablet Take 1 tablet (325 mg total) by mouth in the morning and at bedtime. 60 tablet 3  . megestrol (MEGACE) 40 MG/ML suspension Take 10 mLs (400 mg total) by mouth daily. (Patient taking differently: Take 400 mg by mouth daily as needed.) 240 mL 1  . metFORMIN (GLUCOPHAGE) 500 MG tablet Take 1  tablet (500 mg total) by mouth daily with breakfast. 90 tablet 2   No current facility-administered medications for this visit.     Allergies: No Known Allergies   Physical Exam:  Blood pressure 91/69, pulse (!) 107, temperature 97.8 F (36.6 C), temperature source Tympanic, resp. rate 18, height 5\' 6"  (1.676 m), weight 123 lb (55.8 kg), SpO2 100 %.    ECOG: 1    General appearance: Comfortable appearing without any discomfort Head: Normocephalic without any trauma Oropharynx: Mucous membranes are moist and pink without any thrush or ulcers. Eyes: Pupils are equal and round reactive to light. Lymph nodes: No cervical, supraclavicular, inguinal or axillary lymphadenopathy.   Heart:regular rate and rhythm.  S1 and S2 without leg edema. Lung: Clear without any rhonchi or wheezes.  No dullness to percussion. Abdomin: Soft, nontender, nondistended with good bowel sounds.  No hepatosplenomegaly. Musculoskeletal: No joint deformity or effusion.  Full range of motion noted. Neurological: No deficits noted on motor, sensory and deep tendon reflex exam. Skin: No petechial rash or dryness.  Appeared moist.      Lab Results: Lab Results  Component Value Date   WBC 11.7 (H) 02/20/2021   HGB 7.9 (L) 02/20/2021   HCT 26.5 (L) 02/20/2021   MCV 75.7 (L) 02/20/2021   PLT 633 (H) 02/20/2021     Chemistry      Component Value Date/Time   NA 133 (L) 02/20/2021 0553   NA 139  12/25/2020 1236   K 4.5 02/20/2021 0553   CL 102 02/20/2021 0553   CO2 19 (L) 02/20/2021 0553   BUN 11 02/20/2021 0553   BUN 11 12/25/2020 1236   CREATININE 0.86 02/20/2021 0553   CREATININE 1.28 (H) 02/02/2021 0958   CREATININE 0.80 01/16/2016 1052      Component Value Date/Time   CALCIUM 8.7 (L) 02/20/2021 0553   ALKPHOS 100 02/19/2021 0640   AST 12 (L) 02/19/2021 0640   AST 14 (L) 02/02/2021 0958   ALT 14 02/19/2021 0640   ALT 12 02/02/2021 0958   BILITOT 0.5 02/19/2021 0640   BILITOT <0.2 (L)  02/02/2021 0958      FINDINGS: Lower chest: No acute abnormality.  Hepatobiliary: A stable 1.3 cm x 0.8 cm focus of parenchymal low attenuation is seen within the posterior aspect of the right lobe of the liver. Ill-defined 0.8 cm x 0.6 cm and 0.8 cm x 0.7 cm hyperdense foci are also seen within this region.  Additional 1.9 cm x 1.2 cm and 1.6 cm x 1.5 cm hyperdense lesions are noted within the right lobe (axial CT images 14 and 17, CT series number 2).  A 3.3 cm x 3.0 cm heterogeneous low-attenuation liver mass, with surrounding hyperdense rim, is seen adjacent to the caudate lobe (axial CT image 26, CT series number 2).  No gallstones, gallbladder wall thickening, or biliary dilatation.  Pancreas: Unremarkable. No pancreatic ductal dilatation or surrounding inflammatory changes.  Spleen: Normal in size without focal abnormality.  Adrenals/Urinary Tract: Adrenal glands are unremarkable. The right kidney is normal in size, without renal calculi, focal lesion, or hydronephrosis.  Delayed renal cortical enhancement is seen on the left with mild left-sided perinephric inflammatory fat stranding. A 6.4 cm x 4.9 cm x 4.4 cm heterogeneous low-attenuation mass is seen within the lower pole of the left kidney. This is increased in size when compared to the prior study. An adjacent 5.5 cm x 3.9 cm x 3.6 cm area of heterogeneous attenuation is seen along the lower pole of the left kidney.  There is marked severity left-sided hydronephrosis and proximal hydroureter. The lumen of the proximal left ureter is filled with an area of heterogeneous attenuation which extends inferiorly into the previously described area along the lower pole.  Stomach/Bowel: Stomach is within normal limits. Appendix appears normal. No evidence of bowel wall thickening, distention, or inflammatory changes.  Vascular/Lymphatic: Aortic atherosclerosis. No enlarged abdominal or pelvic lymph  nodes.  Reproductive: There is moderate to marked severity prostate gland enlargement.  Other: No abdominal wall hernia or abnormality. No abdominopelvic ascites.  Musculoskeletal: There is 2 mm to 3 mm retrolisthesis of the L5 vertebral body on S1.  IMPRESSION: 1. Large mass within the lower pole of the left kidney with an adjacent chronic perinephric hematoma. 2. Extrarenal extension of the previously noted left renal mass with invasion into the proximal left ureter and subsequent partial obstruction of the left kidney. Further evaluation with MRI is recommended. 3. Findings consistent with multiple metastatic lesions within the liver. 4. Enlarged prostate gland. 5. 2 mm to 3 mm retrolisthesis of the L5 vertebral body on S1. 6. Aortic atherosclerosis.    Impression and Plan:  70 year old with:  1.  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    His disease status was updated at this time and treatment options were reviewed.  He completed 3 cycles of ipilimumab and nivolumab without any  major complications.  CT scan obtained on March 9 of the abdomen and pelvis showed overall stable disease and may be slight progression.  Risks and benefits at continuing with this treatment versus switching to different salvage therapy were discussed.  After discussion today, I recommended changing his therapy given the less than robust response he is experiencing.  I will update his staging scan of the chest and switch his treatment to cabozantinib.  Complication associated with this treatment for nausea, fatigue, diarrhea and weight loss.  Anticipate starting this treatment next week.  2.  Hypotension and dehydration: He will receive normal saline infusion today instead of treatment.  I asked him to discontinue blood pressure medication for the time being.   3.  IV access: No issues with his  peripheral veins at this time.   4.  Antiemetics: No nausea or vomiting reported currently.  Antiemetics are available to him.  5.  Goals of care and prognosis: His disease is incurable although aggressive measures are warranted.  6.    Abdominal pain and fever: Likely related to advancing malignancy.  7.  Hydronephrosis: Related to his malignancy.  His kidney function remained stable and will continue to monitor.  Evaluation by urology may be needed if he has any symptoms of worsening kidney function.  8.  Anemia: Related to chronic disease and malignancy mostly.  Iron studies on February 21 is more consistent with anemia of chronic disease.  His hemoglobin today is adequate and does not require any additional transfusion.  9.  Follow-up: In 3 weeks for repeat evaluation.  30  minutes were spent on this encounter.  Time was dedicated to reviewing his disease status, discussing treatment options and complications related to his cancer and cancer therapy.    Zola Button, MD 3/16/20228:10 AM

## 2021-02-25 NOTE — Telephone Encounter (Signed)
Oral Oncology Patient Advocate Encounter  Received notification from [employer] that prior authorization for Cabometyx is required.  PA submitted on CoverMyMeds Key [id number] Status is pending  Oral Oncology Clinic will continue to follow.  [place] Patient [place] Phone [phone] Fax [phone] 02/25/2021 9:19 AM

## 2021-02-26 ENCOUNTER — Telehealth: Payer: Self-pay | Admitting: Oncology

## 2021-02-26 NOTE — Telephone Encounter (Addendum)
Oral Chemotherapy Pharmacist Encounter   Spoke with patient's wife today to follow up regarding patient's oral chemotherapy medication: Cabometyx (cabozantinib)  Patient's insurance requires that medication be filled through [facility]. Provided wife with [facility]'s phone number to set up medication shipment [phone]). Additionally informed wife to reach out to the oral chemotherapy clinic if copay provided by outside pharmacy is unaffordable so we can provide assistance to patient for obtaining medication.   Oral chemotherapy clinic will continue to follow for any needs for medication access as well as initial counseling session.  Leron Croak, PharmD, BCPS Hematology/Oncology Clinical Pharmacist [facility] [phone] 02/26/2021 1:54 PM

## 2021-02-26 NOTE — Telephone Encounter (Signed)
Checked out appointment. No LOS notes needing to be scheduled. No changes made. 

## 2021-02-27 NOTE — Telephone Encounter (Signed)
Oral Chemotherapy Pharmacist Encounter  I spoke with patient's wife, Chad Cantu, for overview of: Cabometyx for the treatment of stage IV clear-cell renal cell carcinoma, planned duration until disease progression or unacceptable toxicity.   Counseled on administration, dosing, side effects, monitoring, drug-food interactions, safe handling, storage, and disposal.  Patient will take Cabometyx 40mg  tablets, 1 tablet (40mg ) by mouth once daily on an empty stomach, 1 hour before or 2 hours after a meal.  [name] knows patient needs to avoid grapefruit and grapefruit juice.  Cabometyx start date: ~03/04/21  Adverse effects include but are not limited to: diarrhea, nausea, decreased appetite, fatigue, hypertension, hand-foot syndrome, decreased blood counts, and electrolyte abnormalities. Patient will obtain anti diarrheal and alert the office of 4 or more loose stools above baseline.  Informed that Cabometyx should be held at least 3 weeks prior to any scheduled surgery (including dental surgery) and not resumed until at least 2 weeks after major surgery and until adequate wound healing is established.  Reviewed importance of keeping a medication schedule and plan for any missed doses. No barriers to medication adherence identified.  Medication reconciliation performed and medication/allergy list updated.  Insurance authorization for Cabometyx has been obtained. Patient's insurance requires patient fill Cabometyx through [facility]. Wife is currently working with [facility] to set up medication shipment.   All questions answered.  [name] voiced understanding and appreciation.   Medication education handout placed in mail for patient. Family knows to call the office with questions or concerns. Oral Chemotherapy Clinic phone number provided to patient's wife.   Leron Croak, PharmD, BCPS Hematology/Oncology Clinical Pharmacist [facility] [phone] 02/27/2021 3:50 PM

## 2021-03-03 ENCOUNTER — Telehealth: Payer: Self-pay | Admitting: *Deleted

## 2021-03-03 NOTE — Telephone Encounter (Signed)
-----   Message from [name], MD sent at 03/03/2021 11:09 AM EDT ----- Regarding: RE: Chad Cantu Nothing needs to be done at this point.  She is to monitor him and update [place] with any new complaints. Thanks ----- Message ----- From: Rolene Course, RN Sent: 03/03/2021  10:52 AM EDT To: [name], MD Subject: Chad Cantu                                            Patient's wife called after hours line last night to report patient has been fatigued and lost his balance and fell last night/no injuries per wife.  Patient's wife also reported his is urinating more than usual.  Patient received IV fluids on 02/25/21, patient was scheduled for for opdivo/yervoy originally.  Patient was hospitalized 02/18/21-02/20/21 and received a blood transfusion.  Next appointment is 03/17/21.    Thanks, Bethena Roys

## 2021-03-03 NOTE — Telephone Encounter (Signed)
Called patient's wife back and let her know Dr. Hazeline Junker response below.  She verbalized understanding.

## 2021-03-04 ENCOUNTER — Telehealth: Payer: Self-pay | Admitting: *Deleted

## 2021-03-04 NOTE — Telephone Encounter (Signed)
-----   Message from [name], MD sent at 03/04/2021  1:46 PM EDT ----- Regarding: RE: Hermenia Bers. Noted ----- Message ----- From: Rolene Course, RN Sent: 03/04/2021   1:45 PM EDT To: [name], MD Subject: FYI                                            Phone call received from patient's wife.  She states patient hasn't eaten in three days, hasn't been drinking much at all.  Patient hasn't taken his megace since Friday, she isn't sure why he refuses to take it.  Patient is very weak, wife denies any falls today.  His wife reports his vital signs at home are "good."  I asked her if she tried applesauce, popsicles, etc. He isn't taking in anything orally.  She states he just lies in his bed sleeping and isn't doing anything.  She just wanted to make you aware.  Thanks, Bethena Roys

## 2021-03-04 NOTE — Telephone Encounter (Signed)
Called patient's wife, informed her Dr. Alen Blew is aware of issue.  Informed wife to encourage patient to eat bland foods,take po fluids and to take his megace as prescribed. Wife instructed to call [facility] with any further issues/concerns. She verbalizes understanding.

## 2021-03-05 ENCOUNTER — Encounter: Payer: Self-pay | Admitting: Licensed Clinical Social Worker

## 2021-03-05 ENCOUNTER — Encounter: Payer: Self-pay | Admitting: Oncology

## 2021-03-05 ENCOUNTER — Other Ambulatory Visit: Payer: Self-pay

## 2021-03-05 DIAGNOSIS — C641 Malignant neoplasm of right kidney, except renal pelvis: Secondary | ICD-10-CM

## 2021-03-05 NOTE — Progress Notes (Signed)
[place] [employer]  CSW received message from financial advocate with request from patient's wife for assistance obtaining "a safety rail frame for the Toilet and a walker with wheels. Chad Cantu really need both".  CSW sending request to provider to prescribe the DME in effort to be covered by insurance. Informed wife.   Christeen Douglas, LCSW

## 2021-03-05 NOTE — Progress Notes (Signed)
Received email from spouse regarding assistance with rail and walker.  Advised this is not covered by grant and forwarded email to social workers for review and follow up.

## 2021-03-06 ENCOUNTER — Other Ambulatory Visit: Payer: Self-pay | Admitting: *Deleted

## 2021-03-06 ENCOUNTER — Emergency Department (HOSPITAL_COMMUNITY): Payer: Medicare Other

## 2021-03-06 ENCOUNTER — Telehealth: Payer: Self-pay | Admitting: *Deleted

## 2021-03-06 ENCOUNTER — Encounter (HOSPITAL_COMMUNITY): Payer: Self-pay

## 2021-03-06 ENCOUNTER — Other Ambulatory Visit: Payer: Self-pay

## 2021-03-06 ENCOUNTER — Emergency Department (HOSPITAL_COMMUNITY)
Admission: EM | Admit: 2021-03-06 | Discharge: 2021-03-06 | Disposition: A | Discharge status: Home / Self Care | Payer: Medicare Other | Attending: Emergency Medicine | Admitting: Emergency Medicine

## 2021-03-06 DIAGNOSIS — W01198A Fall on same level from slipping, tripping and stumbling with subsequent striking against other object, initial encounter: Secondary | ICD-10-CM | POA: Diagnosis not present

## 2021-03-06 DIAGNOSIS — Z79899 Other long term (current) drug therapy: Secondary | ICD-10-CM | POA: Insufficient documentation

## 2021-03-06 DIAGNOSIS — Z7984 Long term (current) use of oral hypoglycemic drugs: Secondary | ICD-10-CM | POA: Diagnosis not present

## 2021-03-06 DIAGNOSIS — W19XXXA Unspecified fall, initial encounter: Secondary | ICD-10-CM

## 2021-03-06 DIAGNOSIS — E860 Dehydration: Secondary | ICD-10-CM | POA: Diagnosis not present

## 2021-03-06 DIAGNOSIS — Z87891 Personal history of nicotine dependence: Secondary | ICD-10-CM | POA: Insufficient documentation

## 2021-03-06 DIAGNOSIS — E119 Type 2 diabetes mellitus without complications: Secondary | ICD-10-CM | POA: Diagnosis not present

## 2021-03-06 DIAGNOSIS — Z85528 Personal history of other malignant neoplasm of kidney: Secondary | ICD-10-CM | POA: Diagnosis not present

## 2021-03-06 DIAGNOSIS — I10 Essential (primary) hypertension: Secondary | ICD-10-CM | POA: Diagnosis not present

## 2021-03-06 DIAGNOSIS — R52 Pain, unspecified: Secondary | ICD-10-CM

## 2021-03-06 DIAGNOSIS — M549 Dorsalgia, unspecified: Secondary | ICD-10-CM | POA: Diagnosis present

## 2021-03-06 LAB — CBC WITH DIFFERENTIAL/PLATELET
Abs Immature Granulocytes: 0.11 10*3/uL — ABNORMAL HIGH (ref 0.00–0.07)
Basophils Absolute: 0.1 10*3/uL (ref 0.0–0.1)
Basophils Relative: 0 %
Eosinophils Absolute: 0 10*3/uL (ref 0.0–0.5)
Eosinophils Relative: 0 %
HCT: 25.6 % — ABNORMAL LOW (ref 39.0–52.0)
Hemoglobin: 7.8 g/dL — ABNORMAL LOW (ref 13.0–17.0)
Immature Granulocytes: 1 %
Lymphocytes Relative: 18 %
Lymphs Abs: 2.9 10*3/uL (ref 0.7–4.0)
MCH: 22.8 pg — ABNORMAL LOW (ref 26.0–34.0)
MCHC: 30.5 g/dL (ref 30.0–36.0)
MCV: 74.9 fL — ABNORMAL LOW (ref 80.0–100.0)
Monocytes Absolute: 1.7 10*3/uL — ABNORMAL HIGH (ref 0.1–1.0)
Monocytes Relative: 11 %
Neutro Abs: 11.6 10*3/uL — ABNORMAL HIGH (ref 1.7–7.7)
Neutrophils Relative %: 70 %
Platelets: 712 10*3/uL — ABNORMAL HIGH (ref 150–400)
RBC: 3.42 MIL/uL — ABNORMAL LOW (ref 4.22–5.81)
RDW: 19.6 % — ABNORMAL HIGH (ref 11.5–15.5)
WBC: 16.4 10*3/uL — ABNORMAL HIGH (ref 4.0–10.5)
nRBC: 0 % (ref 0.0–0.2)

## 2021-03-06 LAB — COMPREHENSIVE METABOLIC PANEL
ALT: 37 U/L (ref 0–44)
AST: 20 U/L (ref 15–41)
Albumin: 2.3 g/dL — ABNORMAL LOW (ref 3.5–5.0)
Alkaline Phosphatase: 106 U/L (ref 38–126)
Anion gap: 9 (ref 5–15)
BUN: 26 mg/dL — ABNORMAL HIGH (ref 8–23)
CO2: 21 mmol/L — ABNORMAL LOW (ref 22–32)
Calcium: 13 mg/dL — ABNORMAL HIGH (ref 8.9–10.3)
Chloride: 103 mmol/L (ref 98–111)
Creatinine, Ser: 1.1 mg/dL (ref 0.61–1.24)
GFR, Estimated: >60 mL/min (ref >60)
Glucose, Bld: 105 mg/dL — ABNORMAL HIGH (ref 70–99)
Potassium: 5 mmol/L (ref 3.5–5.1)
Sodium: 133 mmol/L — ABNORMAL LOW (ref 135–145)
Total Bilirubin: 0.3 mg/dL (ref 0.3–1.2)
Total Protein: 9.3 g/dL — ABNORMAL HIGH (ref 6.5–8.1)

## 2021-03-06 MED ORDER — SODIUM CHLORIDE 0.9 % IV BOLUS
1000.0000 mL | Freq: Once | INTRAVENOUS | Status: AC
  Administered 2021-03-06: 1000 mL via INTRAVENOUS

## 2021-03-06 NOTE — ED Triage Notes (Signed)
Patient's wife reports that the patient has been having frequent falls. Patient fell yesterday and reports that he fell and hit his nose and buttocks. Patient is not taking any blood thinners.

## 2021-03-06 NOTE — ED Notes (Signed)
Yellow socks Yellow wrist band fall risk Fall risk sign outside door Wife in room so bed alarm off

## 2021-03-06 NOTE — Telephone Encounter (Signed)
Per patient's & Dr. Hazeline Junker request, orders have been placed for rolling walker & commode.  Called & spoke with Thedore Mins at [facility] who confirmed receipt of orders.  He will follow up with patient.

## 2021-03-06 NOTE — ED Notes (Signed)
Pt walking with walker with steady gait

## 2021-03-06 NOTE — ED Provider Notes (Signed)
[facility] DEPT Provider Note   CSN: [id number] Arrival date & time: 03/06/21  1002     History Chief Complaint  Patient presents with  . Fall  . chemo patient    Chad Cantu is a 70 y.o. male.  Patient with metastatic renal cancer.  He has been home weak over the last few weeks and recently had a fall onto his back.  Patient complains of some back pain  The history is provided by the patient and medical records. No language interpreter was used.  Fall This is a recurrent problem. The current episode started 12 to 24 hours ago. The problem occurs rarely. The problem has been resolved. Pertinent negatives include no chest pain, no abdominal pain and no headaches. Nothing aggravates the symptoms. Nothing relieves the symptoms. He has tried nothing for the symptoms.       Past Medical History:  Diagnosis Date  . Cancer ([place])   . Diabetes mellitus without complication ([place])   . ETOH abuse   . Hyperlipidemia   . Hypertension   . Substance abuse ([place])    ETOH, cocaine.  Remission    Patient Active Problem List   Diagnosis Date Noted  . Symptomatic anemia 02/18/2021  . History of recent fall 12/25/2020  . Hypercalcemia 12/22/2020  . Metastatic renal cell carcinoma ([place]) 12/09/2020  . Goals of care, counseling/discussion 12/09/2020  . AKI (acute kidney injury) ([place]) 12/09/2020  . Leukocytosis 12/09/2020  . Alcohol dependence with uncomplicated withdrawal ([place]) 11/16/2020  . Near syncope 09/20/2020  . Dehydration 09/20/2020  . Alcohol abuse 09/20/2020  . Alcohol use disorder, severe, dependence ([place])   . Memory difficulty 05/02/2017  . Rash, skin 05/02/2017  . Overactive bladder 05/02/2017  . Alcohol use disorder 05/01/2017  . HLD (hyperlipidemia) 04/09/2015  . Essential hypertension 04/08/2015  . Tobacco abuse 04/08/2015    Past Surgical History:  Procedure Laterality Date  . COLONOSCOPY  5 years ago?   at [employer] maybe per  pt,?unsure results  . LAPAROSCOPIC GASTROTOMY W/ REPAIR OF ULCER         Family History  Problem Relation Age of Onset  . Diabetes Mother   . Colon cancer Neg Hx   . Colon polyps Neg Hx   . Esophageal cancer Neg Hx   . Rectal cancer Neg Hx   . Stomach cancer Neg Hx     Social History   Tobacco Use  . Smoking status: Former Smoker    Years: 55.00    Types: Cigarettes  . Smokeless tobacco: Never Used  Vaping Use  . Vaping Use: Never used  Substance Use Topics  . Alcohol use: Not Currently    Alcohol/week: 12.0 standard drinks    Types: 12 Cans of beer per week  . Drug use: Not Currently    Types: Marijuana    Comment: marijuana maybe 2 months ago, remote h/o cocaine use    Home Medications Prior to Admission medications   Medication Sig Start Date End Date Taking? Authorizing Provider  acetaminophen (TYLENOL) 325 MG tablet Take 2 tablets (650 mg total) by mouth every 6 (six) hours as needed for mild pain (or Fever >/= 101). 11/22/20   Domenic Polite, MD  Ascorbic Acid (VITAMIN C PO) Take by mouth.    [provider]  atorvastatin (LIPITOR) 20 MG tablet Take 1 tablet (20 mg total) by mouth daily. 06/05/20   [name], MD  cabozantinib (CABOMETYX) 40 MG tablet Take 1 tablet (40  mg total) by mouth daily. Take on an empty stomach, 1 hour before or 2 hours after meals. 03/03/21   [name], MD  ferrous sulfate 325 (65 FE) MG EC tablet Take 1 tablet (325 mg total) by mouth in the morning and at bedtime. 02/02/21   [name], MD  megestrol (MEGACE) 40 MG/ML suspension Take 10 mLs (400 mg total) by mouth daily. Patient taking differently: Take 400 mg by mouth daily as needed. 02/02/21   [name], MD  metFORMIN (GLUCOPHAGE) 500 MG tablet Take 1 tablet (500 mg total) by mouth daily with breakfast. 06/05/20   [name], MD  lisinopril (ZESTRIL) 20 MG tablet Take 20 mg by mouth daily. 03/26/20 09/20/20  [provider]    Allergies     Patient has no known allergies.  Review of Systems   Review of Systems  Constitutional: Negative for appetite change and fatigue.  HENT: Negative for congestion, ear discharge and sinus pressure.   Eyes: Negative for discharge.  Respiratory: Negative for cough.   Cardiovascular: Negative for chest pain.  Gastrointestinal: Negative for abdominal pain and diarrhea.  Genitourinary: Negative for frequency and hematuria.  Musculoskeletal: Positive for back pain.  Skin: Negative for rash.  Neurological: Negative for seizures and headaches.  Psychiatric/Behavioral: Negative for hallucinations.    Physical Exam Updated Vital Signs BP 111/78   Pulse 64   Temp 97.7 F (36.5 C) (Oral)   Resp 20   Ht 5\' 6"  (1.676 m)   Wt 54.4 kg   SpO2 95%   BMI 19.37 kg/m   Physical Exam Constitutional:      Appearance: He is well-developed.  HENT:     Head: Normocephalic.     Comments: Oropharynx shows dehydration    Nose: Nose normal.  Eyes:     General: No scleral icterus.    Conjunctiva/sclera: Conjunctivae normal.  Neck:     Thyroid: No thyromegaly.  Cardiovascular:     Rate and Rhythm: Normal rate and regular rhythm.     Heart sounds: No murmur heard. No friction rub. No gallop.   Pulmonary:     Breath sounds: No stridor. No wheezing or rales.  Chest:     Chest wall: No tenderness.  Abdominal:     General: There is no distension.     Tenderness: There is no abdominal tenderness. There is no rebound.  Musculoskeletal:        General: Normal range of motion.     Cervical back: Normal range of motion and neck supple.     Comments: Tender lumbar and sacral area  Lymphadenopathy:     Cervical: No cervical adenopathy.  Skin:    Findings: No erythema or rash.  Neurological:     Mental Status: He is alert and oriented to person, place, and time.     Motor: No abnormal muscle tone.     Coordination: Coordination normal.  Psychiatric:        Behavior: Behavior normal.     ED  Results / Procedures / Treatments   Labs (all labs ordered are listed, but only abnormal results are displayed) Labs Reviewed  CBC WITH DIFFERENTIAL/PLATELET - Abnormal; Notable for the following components:      Result Value   WBC 16.4 (*)    RBC 3.42 (*)    Hemoglobin 7.8 (*)    HCT 25.6 (*)    MCV 74.9 (*)    MCH 22.8 (*)    RDW 19.6 (*)  Platelets 712 (*)    Neutro Abs 11.6 (*)    Monocytes Absolute 1.7 (*)    Abs Immature Granulocytes 0.11 (*)    All other components within normal limits  COMPREHENSIVE METABOLIC PANEL - Abnormal; Notable for the following components:   Sodium 133 (*)    CO2 21 (*)    Glucose, Bld 105 (*)    BUN 26 (*)    Calcium 13.0 (*)    Total Protein 9.3 (*)    Albumin 2.3 (*)    All other components within normal limits    EKG None  Radiology DG Lumbar Spine Complete  Result Date: 03/06/2021 CLINICAL DATA:  Recent falls, back pain EXAM: LUMBAR SPINE - COMPLETE 4+ VIEW COMPARISON:  11/19/2020 CT reconstructions FINDINGS: Normal alignment. Similar degenerative disc disease at L5-S1 with disc space narrowing, sclerosis and endplate osteophytes. Facet arthropathy at L4-5 and L5-S1. Preserved vertebral body heights. No acute compression fracture, wedge-shaped deformity or focal kyphosis. Aortoiliac atherosclerosis noted. Normal appearing pedicles and SI joints for age. No pars defects. IMPRESSION: Stable lower lumbar degenerative changes as above. No acute osseous finding by plain radiography Aortic Atherosclerosis (ICD10-I70.0). Electronically Signed   By: Jerilynn Mages.  Shick M.D.   On: 03/06/2021 12:15   DG Sacrum/Coccyx  Result Date: 03/06/2021 CLINICAL DATA:  Fall, pain EXAM: SACRUM AND COCCYX - 2+ VIEW COMPARISON:  03/06/2021 FINDINGS: Limited lateral view. No gross acute osseous finding or displaced fracture. Degenerative changes of the lumbosacral junction. Normal SI joints for age. Bilateral moderate hip degenerative arthropathy with joint space loss,  sclerosis and bony spurring. IMPRESSION: Limited assessment of the sacrococcygeal area. No definite acute osseous finding. Degenerative changes as above. Electronically Signed   By: Jerilynn Mages.  Shick M.D.   On: 03/06/2021 12:17    Procedures Procedures   Medications Ordered in ED Medications  sodium chloride 0.9 % bolus 1,000 mL (1,000 mLs Intravenous New Bag/Given 03/06/21 1116)    ED Course  I have reviewed the triage vital signs and the nursing notes.  Pertinent labs & imaging results that were available during my care of the patient were reviewed by me and considered in my medical decision making (see chart for details).    MDM Rules/Calculators/A&P                         Patient with weakness and recent fall.  X-rays unremarkable.  Patient is dehydrated and was given 1 L of fluids and will follow up with his oncologist and use a walker to ambulate with Final Clinical Impression(s) / ED Diagnoses Final diagnoses:  Fall, initial encounter  Dehydration    Rx / DC Orders ED Discharge Orders    None       Milton Ferguson, MD 03/06/21 1439

## 2021-03-06 NOTE — Discharge Instructions (Addendum)
Drink plenty of fluids and use a walker to ambulate with.  return if any problems and keep your appointment with your doctor as scheduled

## 2021-03-09 ENCOUNTER — Other Ambulatory Visit: Payer: Self-pay

## 2021-03-09 ENCOUNTER — Ambulatory Visit: Payer: Medicare Other | Attending: Internal Medicine | Admitting: Internal Medicine

## 2021-03-09 DIAGNOSIS — Z9181 History of falling: Secondary | ICD-10-CM

## 2021-03-09 DIAGNOSIS — R42 Dizziness and giddiness: Secondary | ICD-10-CM | POA: Diagnosis not present

## 2021-03-09 DIAGNOSIS — Z09 Encounter for follow-up examination after completed treatment for conditions other than malignant neoplasm: Secondary | ICD-10-CM

## 2021-03-09 DIAGNOSIS — D638 Anemia in other chronic diseases classified elsewhere: Secondary | ICD-10-CM | POA: Diagnosis not present

## 2021-03-09 DIAGNOSIS — F1021 Alcohol dependence, in remission: Secondary | ICD-10-CM

## 2021-03-09 DIAGNOSIS — Z87891 Personal history of nicotine dependence: Secondary | ICD-10-CM

## 2021-03-09 NOTE — Progress Notes (Signed)
Virtual Visit via Telephone Note  I connected with Chad Cantu on 03/09/21 at 12:09 p.m by telephone and verified that I am speaking with the correct person using two identifiers.  Location: Patient: home Provider: office  Participants: Myself Patient and Ms. Chad Cantu CMA: Ms. Maryclare Bean interpreter:   I discussed the limitations, risks, security and privacy concerns of performing an evaluation and management service by telephone and the availability of in person appointments. I also discussed with the patient that there may be a patient responsible charge related to this service. The patient expressed understanding and agreed to proceed.   History of Present Illness: Hx of prediabetes, HTN, tobdep,HL, CTS RT, ETOH use disorder.  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 Date of hospitalization 3/9-10/2021.  Patient was hospitalized with dizziness and left lower quadrant abdominal pain x1 week.  He was noted to be febrile and tachycardic and was diagnosed with having SIRS.  Work-up revealed no infection.  He did receive empiric antibiotic.  Fever thought to be due to malignancy.  He was found to have symptomatic anemia and was transfused 1 unit of packed red blood cells.  FOBT negative.  Advised to continue iron. CT of the abdomen revealed large mass within the lower pole of the left kidney with an adjacent chronic perinephritic hematoma.  Extrarenal extension of previously noted left renal mass with invasion into the proximal left ureter and subsequent partial obstruction of the left kidney.  Patient was seen by urology Dr. Junious Silk and his oncologist Dr. Alen Blew.  Today: Wife and patient report that he is a little better.  No problems passing his urine. No fever.  He still feels dizzy when he is up moving around.  Seen in the emergency room 03/06/2021 after he had fallen twice at  home.  He complained of lower back pain.  X-ray of the coccyx and lumbar spine revealed no acute fractures.  He was sent home with a walker and bedside commode.  He has been using them.  No further falls.  Lower back pain has resolved.  Wife reports that he has been eating fairly good for the past 2 days.  He drinks a lot of water, Ensure and V8.  He reports compliance with taking the iron supplement.  Dr. Alen Blew has changed his treatment medication to an oral med called Cabozantinib to see if he will have a better response.  He has been tobacco free and alcohol free since December 2021. Outpatient Encounter Medications as of 03/09/2021  Medication Sig Note  . acetaminophen (TYLENOL) 325 MG tablet Take 2 tablets (650 mg total) by mouth every 6 (six) hours as needed for mild pain (or Fever >/= 101). 02/18/2021: 1 dose at home as of 02/18/21  . Ascorbic Acid (VITAMIN C PO) Take by mouth.   Marland Kitchen atorvastatin (LIPITOR) 20 MG tablet Take 1 tablet (20 mg total) by mouth daily.   . cabozantinib (CABOMETYX) 40 MG tablet Take 1 tablet (40 mg total) by mouth daily. Take on an empty stomach, 1 hour before or 2 hours after meals.   . ferrous sulfate 325 (65 FE) MG EC tablet Take 1 tablet (325 mg total) by mouth in the morning and at bedtime. 02/18/2021: 1 dose as of 02/18/21  . megestrol (MEGACE) 40 MG/ML suspension Take 10 mLs (400 mg total) by mouth daily. (Patient taking differently: Take 400 mg by mouth daily as needed.)   .  metFORMIN (GLUCOPHAGE) 500 MG tablet Take 1 tablet (500 mg total) by mouth daily with breakfast. 12/09/2020: No Fill Hx  . [DISCONTINUED] lisinopril (ZESTRIL) 20 MG tablet Take 20 mg by mouth daily. 09/20/2020: Holding due to hypotensive episode   No facility-administered encounter medications on file as of 03/09/2021.      Observations/Objective:   Chemistry      Component Value Date/Time   NA 133 (L) 03/06/2021 1118   NA 139 12/25/2020 1236   K 5.0 03/06/2021 1118   CL 103 03/06/2021  1118   CO2 21 (L) 03/06/2021 1118   BUN 26 (H) 03/06/2021 1118   BUN 11 12/25/2020 1236   CREATININE 1.10 03/06/2021 1118   CREATININE 0.98 02/25/2021 0806   CREATININE 0.80 01/16/2016 1052      Component Value Date/Time   CALCIUM 13.0 (H) 03/06/2021 1118   ALKPHOS 106 03/06/2021 1118   AST 20 03/06/2021 1118   AST 13 (L) 02/25/2021 0806   ALT 37 03/06/2021 1118   ALT 15 02/25/2021 0806   BILITOT 0.3 03/06/2021 1118   BILITOT 0.3 02/25/2021 0806     Lab Results  Component Value Date   WBC 16.4 (H) 03/06/2021   HGB 7.8 (L) 03/06/2021   HCT 25.6 (L) 03/06/2021   MCV 74.9 (L) 03/06/2021   PLT 712 (H) 03/06/2021     Assessment and Plan: 1. Hospital discharge follow-up  2. Dizziness 3. History of recent fall Likely due to hypotension, intermittent orthostasis and anemia. Encourage good intake of oral fluids.  Encouraged to go slow with position changes and to use the walker.  4. Anemia, chronic disease On iron and followed by his oncologist.  Continue iron supplement Up-to-date with colonoscopy.  33. Former smoker Commended him on quitting and encouraged him to remain free of tobacco products  6. Alcohol use disorder, moderate, in early remission ([place]) Commended him on quitting.   Follow Up Instructions: 4 months   I discussed the assessment and treatment plan with the patient. The patient was provided an opportunity to ask questions and all were answered. The patient agreed with the plan and demonstrated an understanding of the instructions.   The patient was advised to call back or seek an in-person evaluation if the symptoms worsen or if the condition fails to improve as anticipated.  I spent 8 minutes on this telephone encounter Karle Plumber, MD

## 2021-03-13 ENCOUNTER — Other Ambulatory Visit: Payer: Self-pay | Admitting: Oncology

## 2021-03-13 ENCOUNTER — Telehealth (INDEPENDENT_AMBULATORY_CARE_PROVIDER_SITE_OTHER): Payer: Self-pay

## 2021-03-13 DIAGNOSIS — C641 Malignant neoplasm of right kidney, except renal pelvis: Secondary | ICD-10-CM

## 2021-03-13 NOTE — Telephone Encounter (Signed)
Per patients wife they are waiting until after his oncology appointment to schedule follow up with PCP. Nat Christen, CMA

## 2021-03-13 NOTE — Telephone Encounter (Signed)
-----   Message from [name], MD sent at 03/09/2021 12:17 PM EDT ----- Needs f/u in 4 1/2 mths

## 2021-03-14 ENCOUNTER — Other Ambulatory Visit: Payer: Self-pay

## 2021-03-16 ENCOUNTER — Other Ambulatory Visit: Payer: Self-pay

## 2021-03-16 ENCOUNTER — Other Ambulatory Visit: Payer: Self-pay | Admitting: Oncology

## 2021-03-16 MED ORDER — MEGESTROL ACETATE 40 MG/ML PO SUSP
ORAL | 1 refills | Status: DC
  Filled 2021-03-16: qty 240, 24d supply, fill #0
  Filled 2021-04-05: qty 240, 24d supply, fill #1

## 2021-03-17 ENCOUNTER — Inpatient Hospital Stay: Payer: Medicare Other | Admitting: Dietician

## 2021-03-17 ENCOUNTER — Inpatient Hospital Stay: Payer: Medicare Other

## 2021-03-17 ENCOUNTER — Other Ambulatory Visit: Payer: Self-pay

## 2021-03-17 ENCOUNTER — Inpatient Hospital Stay: Payer: Medicare Other | Attending: Oncology

## 2021-03-17 ENCOUNTER — Inpatient Hospital Stay (HOSPITAL_BASED_OUTPATIENT_CLINIC_OR_DEPARTMENT_OTHER): Payer: Medicare Other | Admitting: Oncology

## 2021-03-17 VITALS — BP 99/76 | HR 94 | Temp 97.2°F | Resp 18 | Wt 120.0 lb

## 2021-03-17 DIAGNOSIS — D630 Anemia in neoplastic disease: Secondary | ICD-10-CM | POA: Diagnosis not present

## 2021-03-17 DIAGNOSIS — C642 Malignant neoplasm of left kidney, except renal pelvis: Secondary | ICD-10-CM | POA: Diagnosis not present

## 2021-03-17 DIAGNOSIS — C7800 Secondary malignant neoplasm of unspecified lung: Secondary | ICD-10-CM | POA: Insufficient documentation

## 2021-03-17 DIAGNOSIS — E860 Dehydration: Secondary | ICD-10-CM | POA: Diagnosis not present

## 2021-03-17 DIAGNOSIS — C787 Secondary malignant neoplasm of liver and intrahepatic bile duct: Secondary | ICD-10-CM | POA: Insufficient documentation

## 2021-03-17 DIAGNOSIS — C641 Malignant neoplasm of right kidney, except renal pelvis: Secondary | ICD-10-CM

## 2021-03-17 DIAGNOSIS — E032 Hypothyroidism due to medicaments and other exogenous substances: Secondary | ICD-10-CM

## 2021-03-17 DIAGNOSIS — C649 Malignant neoplasm of unspecified kidney, except renal pelvis: Secondary | ICD-10-CM

## 2021-03-17 DIAGNOSIS — D500 Iron deficiency anemia secondary to blood loss (chronic): Secondary | ICD-10-CM

## 2021-03-17 LAB — CMP (CANCER CENTER ONLY)
ALT: 25 U/L (ref 0–44)
AST: 21 U/L (ref 15–41)
Albumin: 1.6 g/dL — ABNORMAL LOW (ref 3.5–5.0)
Alkaline Phosphatase: 105 U/L (ref 38–126)
Anion gap: 12 (ref 5–15)
BUN: 8 mg/dL (ref 8–23)
CO2: 21 mmol/L — ABNORMAL LOW (ref 22–32)
Calcium: 8.6 mg/dL — ABNORMAL LOW (ref 8.9–10.3)
Chloride: 104 mmol/L (ref 98–111)
Creatinine: 0.79 mg/dL (ref 0.61–1.24)
GFR, Estimated: >60 mL/min (ref >60)
Glucose, Bld: 120 mg/dL — ABNORMAL HIGH (ref 70–99)
Potassium: 4.4 mmol/L (ref 3.5–5.1)
Sodium: 137 mmol/L (ref 135–145)
Total Bilirubin: <0.2 mg/dL — ABNORMAL LOW (ref 0.3–1.2)
Total Protein: 8 g/dL (ref 6.5–8.1)

## 2021-03-17 LAB — CBC WITH DIFFERENTIAL (CANCER CENTER ONLY)
Abs Immature Granulocytes: 0.03 10*3/uL (ref 0.00–0.07)
Basophils Absolute: 0.1 10*3/uL (ref 0.0–0.1)
Basophils Relative: 1 %
Eosinophils Absolute: 0.1 10*3/uL (ref 0.0–0.5)
Eosinophils Relative: 1 %
HCT: 29.3 % — ABNORMAL LOW (ref 39.0–52.0)
Hemoglobin: 8.9 g/dL — ABNORMAL LOW (ref 13.0–17.0)
Immature Granulocytes: 0 %
Lymphocytes Relative: 22 %
Lymphs Abs: 2.8 10*3/uL (ref 0.7–4.0)
MCH: 22 pg — ABNORMAL LOW (ref 26.0–34.0)
MCHC: 30.4 g/dL (ref 30.0–36.0)
MCV: 72.3 fL — ABNORMAL LOW (ref 80.0–100.0)
Monocytes Absolute: 0.6 10*3/uL (ref 0.1–1.0)
Monocytes Relative: 4 %
Neutro Abs: 9 10*3/uL — ABNORMAL HIGH (ref 1.7–7.7)
Neutrophils Relative %: 72 %
Platelet Count: 611 10*3/uL — ABNORMAL HIGH (ref 150–400)
RBC: 4.05 MIL/uL — ABNORMAL LOW (ref 4.22–5.81)
RDW: 19.8 % — ABNORMAL HIGH (ref 11.5–15.5)
WBC Count: 12.6 10*3/uL — ABNORMAL HIGH (ref 4.0–10.5)
nRBC: 0 % (ref 0.0–0.2)

## 2021-03-17 LAB — SAMPLE TO BLOOD BANK

## 2021-03-17 LAB — TSH: TSH: 0.892 u[IU]/mL (ref 0.320–4.118)

## 2021-03-17 NOTE — Progress Notes (Signed)
Hematology and Oncology Follow Up Visit  Chad Cantu [id number] 1950-12-26 70 y.o. 03/17/2021 11:36 AM [name], MDJohnson, [name], MD   Principle Diagnosis: 70 year old with kidney cancer diagnosed December 2021.  He was found to have intermediate risk, stage IV clear-cell renal cell carcinoma with hepatic and pulmonary metastasis. .   Prior Therapy:  He is status post ultrasound biopsy of the hepatic lesion on December 01, 2020.  The final pathology confirmed the presence of metastatic clear-cell renal cell carcinoma. Ipilimumab 1 mg/kg and nivolumab 3 mg/kg therapy is started on December 23, 2019.  He completed 3 cycles of therapy with progression of disease.    Current therapy: Cabometyx 40 mg daily started on March 04, 2021.  Interim History: Mr. Chad Cantu is here for a follow-up visit.  Since the last visit, he reports no major changes in his health.  He was started on Cabometyx and tolerated for the last 2 weeks.  He denies any nausea, vomiting or abdominal pain.  He did report episode of lightheadedness and dizziness and sustained a fall.  He was seen in emergency department March 06, 2021 and was given IV hydration and felt better since that time.  He had denies any abdominal pain, diarrhea or any more dizziness or lightheadedness.  He did not have any falls since that time.  Feels that he is eating better although he lost few pounds.     Medications: Unchanged on review. Current Outpatient Medications  Medication Sig Dispense Refill  . acetaminophen (TYLENOL) 325 MG tablet Take 2 tablets (650 mg total) by mouth every 6 (six) hours as needed for mild pain (or Fever >/= 101).    . Ascorbic Acid (VITAMIN C PO) Take by mouth.    Marland Kitchen atorvastatin (LIPITOR) 20 MG tablet Take 1 tablet (20 mg total) by mouth daily. 90 tablet 3  . atorvastatin (LIPITOR) 20 MG tablet TAKE 1 TABLET BY MOUTH DAILY. 90 tablet 2  . CABOMETYX 40 MG tablet TAKE 1 TABLET (40 MG TOTAL) DAILY. TAKE ON AN  EMPTY STOMACH 1 HOUR BEFORE OR 2 HOURS AFTER MEALS 30 tablet 0  . dexamethasone (DECADRON) 1 MG tablet TAKE 1 TABLET BY MOUTH AT 11PM, PRIOR TO THE 8AM CORTISOL LAB DRAW 1 tablet 0  . ferrous sulfate 325 (65 FE) MG EC tablet Take 1 tablet (325 mg total) by mouth in the morning and at bedtime. 60 tablet 3  . ferrous sulfate 325 (65 FE) MG tablet TAKE 1 TABLET (325 MG TOTAL) BY MOUTH IN THE MORNING AND AT BEDTIME. 60 tablet 3  . lisinopril-hydrochlorothiazide (ZESTORETIC) 10-12.5 MG tablet TAKE 1 TABLET BY MOUTH DAILY. 90 tablet 2  . megestrol (MEGACE) 40 MG/ML suspension TAKE 10 MLS (400 MG TOTAL) BY MOUTH 2 (TWO) TIMES DAILY. 240 mL 0  . megestrol (MEGACE) 40 MG/ML suspension TAKE 10 MLS (400 MG TOTAL) BY MOUTH DAILY. 240 mL 1  . metFORMIN (GLUCOPHAGE) 500 MG tablet TAKE 1 TABLET (500 MG TOTAL) BY MOUTH DAILY WITH BREAKFAST. 90 tablet 2   No current facility-administered medications for this visit.     Allergies: No Known Allergies   Physical Exam:   Blood pressure 99/76, pulse 94, temperature (!) 97.2 F (36.2 C), temperature source Tympanic, resp. rate 18, weight 120 lb (54.4 kg), SpO2 100 %.    ECOG: 1   General appearance: Alert, awake without any distress. Head: Atraumatic without abnormalities Oropharynx: Without any thrush or ulcers. Eyes: No scleral icterus. Lymph nodes: No lymphadenopathy noted  in the cervical, supraclavicular, or axillary nodes Heart:regular rate and rhythm, without any murmurs or gallops.   Lung: Clear to auscultation without any rhonchi, wheezes or dullness to percussion. Abdomin: Soft, nontender without any shifting dullness or ascites. Musculoskeletal: No clubbing or cyanosis. Neurological: No motor or sensory deficits. Skin: No rashes or lesions.      Lab Results: Lab Results  Component Value Date   WBC 16.4 (H) 03/06/2021   HGB 7.8 (L) 03/06/2021   HCT 25.6 (L) 03/06/2021   MCV 74.9 (L) 03/06/2021   PLT 712 (H) 03/06/2021      Chemistry      Component Value Date/Time   NA 133 (L) 03/06/2021 1118   NA 139 12/25/2020 1236   K 5.0 03/06/2021 1118   CL 103 03/06/2021 1118   CO2 21 (L) 03/06/2021 1118   BUN 26 (H) 03/06/2021 1118   BUN 11 12/25/2020 1236   CREATININE 1.10 03/06/2021 1118   CREATININE 0.98 02/25/2021 0806   CREATININE 0.80 01/16/2016 1052      Component Value Date/Time   CALCIUM 13.0 (H) 03/06/2021 1118   ALKPHOS 106 03/06/2021 1118   AST 20 03/06/2021 1118   AST 13 (L) 02/25/2021 0806   ALT 37 03/06/2021 1118   ALT 15 02/25/2021 0806   BILITOT 0.3 03/06/2021 1118   BILITOT 0.3 02/25/2021 0806       Impression and Plan:  38 year old with:  1.  Kidney cancer diagnosed in December 2021.  He was found to have intermediate risk, stage IV clear-cell histology with hepatic and pulmonary disease.   His disease status was updated at this time and has developed progression of disease on immunotherapy.  Risks and benefits of continuing Cabometyx were reviewed.  Alternative options including adding nivolumab or different oral targeted therapy.  2.  Hypotension and dehydration: Improved at this time with hydration.  Will await laboratory testing today determine whether he will require any additional hydration or electrolyte supplements.   3.  Hydronephrosis: His kidney function remained stable and no urological intervention is warranted.  This is related to his malignancy.   4.    GI complaints of nausea and abdominal pain: Compazine is available to him without any nausea or vomiting.  5.  Goals of care and prognosis: Any treatment is palliative at this time with his disease is incurable.  6.  Anemia: Multifactorial in nature related to chronic disease and malignancy.  His hemoglobin today is up to 8.9 and does not require any intervention.  We will continue to monitor.  7.  Hypocalcemia: Related to out malignancy and dehydration.  He received Zometa on January 13, 2021.  His calcium  on March 25 was elevated and will be repeated today.  Zometa will be repeated at this time.  8.  Follow-up: In 4 to 6 weeks to follow his progress.  30  minutes were dedicated to this encounter.  The time was spent on updating disease status, discussing treatment options and addressing complications related to his cancer and cancer therapy.    Zola Button, MD 4/5/202211:36 AM

## 2021-03-17 NOTE — Progress Notes (Signed)
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 He is currently taking daily oral chemo and is followed by Dr. Alen Blew.   3/25 - seen in ED after fall secondary to weakness and dehydration. Received 1L IV fluids and discharge home.  Received message from RN that patient would not be receiving infusion today. Nutrition follow-up completed with wife via telephone. She reports patient is doing "really good" states his appetite is great and he is eating "everything" He is drinking 2-3 Ensure Complete daily. Patient is not having nutrition impact symptoms.   Medications: Cabometyx, Decadron, Vit C, Ferrous sulfate, Megace, Metformin  Labs: Glucose 120, Albumin 1.6  Anthropometrics: Weight 120 lb today stable over the last 2 weeks, down from 123 lb on 3/16   3/10 - 131 lb 6.3 oz 2/21 - 131 lb 4.8 oz 1/13 - 144 lb 6.4 oz   NUTRITION DIAGNOSIS: Inadequate oral intake improving   INTERVENTION:  Encouraged wife to continue high calorie, high protein meals and snacks Handouts previously provided Continue to drink 3 Ensure Complete daily (350 kcal, 30 grams protein each) Continue Megace   MONITORING, EVALUATION, GOAL: weight trends, oral intake   NEXT VISIT: Friday, April 29 via telephone

## 2021-04-03 ENCOUNTER — Other Ambulatory Visit: Payer: Self-pay | Admitting: Oncology

## 2021-04-03 DIAGNOSIS — C641 Malignant neoplasm of right kidney, except renal pelvis: Secondary | ICD-10-CM

## 2021-04-06 ENCOUNTER — Telehealth: Payer: Self-pay

## 2021-04-06 ENCOUNTER — Other Ambulatory Visit: Payer: Self-pay

## 2021-04-06 NOTE — Telephone Encounter (Signed)
Returned call to pt's wife. Verified magic Mouthwash had been sent in to pt's pharmacy ([facility] per her request).

## 2021-04-07 ENCOUNTER — Other Ambulatory Visit: Payer: Self-pay | Admitting: Oncology

## 2021-04-07 ENCOUNTER — Other Ambulatory Visit: Payer: Self-pay

## 2021-04-08 ENCOUNTER — Other Ambulatory Visit: Payer: Self-pay | Admitting: *Deleted

## 2021-04-08 ENCOUNTER — Telehealth: Payer: Self-pay | Admitting: *Deleted

## 2021-04-08 DIAGNOSIS — G4700 Insomnia, unspecified: Secondary | ICD-10-CM

## 2021-04-08 MED ORDER — MIRTAZAPINE 7.5 MG PO TABS
7.5000 mg | ORAL_TABLET | Freq: Every day | ORAL | 3 refills | Status: DC
Start: 2021-04-08 — End: 2022-05-26

## 2021-04-08 NOTE — Telephone Encounter (Signed)
-----   Message from [name], MD sent at 04/08/2021  3:15 PM EDT ----- Regarding: RE: Insomnia Please send a prescription for Remeron 7.5 mg to be taken at nighttime.  This should help with sleep and to boost the appetite at the same time.  #30 with 3 refills. ----- Message ----- From: Rolene Course, RN Sent: 04/08/2021   2:49 PM EDT To: [name], MD Subject: Insomnia                                       This patient's wife called and said he is having a very hard time sleeping, she thinks this is a side effect of his megace.  So she is wanting to know if he can either have something else to stimulate his appetite or something for sleep.  Please advise, thanks.

## 2021-04-08 NOTE — Telephone Encounter (Signed)
PC to patient's wife informing her of Dr. Hazeline Junker recommendation below. RN sending prescription this afternoon. She verbalizes understanding.

## 2021-04-09 ENCOUNTER — Telehealth: Payer: Self-pay | Admitting: *Deleted

## 2021-04-09 NOTE — Telephone Encounter (Signed)
PC to patient's wife, advised her to hold cabometyx until his next appointment on 04/22/21.  She verbalizes understanding.  Informed her to call [phone] with any further questions/concerns.

## 2021-04-09 NOTE — Telephone Encounter (Signed)
-----   Message from [name], MD sent at 04/09/2021  1:42 PM EDT ----- Please advise to hold the medication till next MD visit. Thanks ----- Message ----- From: Rolene Course, RN Sent: 04/09/2021   1:38 PM EDT To: [name], MD  Mr Shreeve's wife called and is asking if she should DC his cabometyx because he has white blisters on the back of his tongue.  He states the mouthwash that was prescribed previously burns his mouth.  Please advise, thanks.  Bethena Roys

## 2021-04-10 ENCOUNTER — Telehealth: Payer: Self-pay | Admitting: Dietician

## 2021-04-10 NOTE — Telephone Encounter (Signed)
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 He is taking daily oral chemotherapy.  Spoke with wife via telephone today. She reports patient is doing "great" and "eating everything" Wife reports patient has increased energy and has been preparing some of his own meals. He had cream of wheat for breakfast and a steak sandwich with noodles he cooked himself for lunch today. Wife reports he is drinking lots of water and likes Gatorade energy drinks. Wife reports patient has mouth sores, magic mouthwash called into pharmacy burns, advised to hold carbometyx until next appointment.    Medications: Remeron  Labs: reviewed  Anthropometrics: 120 lb on 4/5 stable   NUTRITION DIAGNOSIS: Inadequate oral intake improving    INTERVENTION:  Continue small frequent meals and snacks Continue drinking 3 Ensure Complete daily, will mail coupons Educated on soft moist high protein foods, will mail handout Discussed oral care, encouraged baking soda/salt water rinse several times daily, recipe given, will mail handout   MONITORING, EVALUATION, GOAL: weight trends, intake   NEXT VISIT: via telephone ~3-4 weeks

## 2021-04-20 ENCOUNTER — Other Ambulatory Visit (HOSPITAL_COMMUNITY): Payer: Self-pay

## 2021-04-22 ENCOUNTER — Inpatient Hospital Stay: Payer: Medicare Other | Attending: Oncology

## 2021-04-22 ENCOUNTER — Inpatient Hospital Stay (HOSPITAL_BASED_OUTPATIENT_CLINIC_OR_DEPARTMENT_OTHER): Payer: Medicare Other | Admitting: Oncology

## 2021-04-22 ENCOUNTER — Other Ambulatory Visit: Payer: Self-pay

## 2021-04-22 DIAGNOSIS — Z79899 Other long term (current) drug therapy: Secondary | ICD-10-CM | POA: Diagnosis not present

## 2021-04-22 DIAGNOSIS — D500 Iron deficiency anemia secondary to blood loss (chronic): Secondary | ICD-10-CM

## 2021-04-22 DIAGNOSIS — Z7984 Long term (current) use of oral hypoglycemic drugs: Secondary | ICD-10-CM | POA: Insufficient documentation

## 2021-04-22 DIAGNOSIS — D638 Anemia in other chronic diseases classified elsewhere: Secondary | ICD-10-CM | POA: Diagnosis not present

## 2021-04-22 DIAGNOSIS — C642 Malignant neoplasm of left kidney, except renal pelvis: Secondary | ICD-10-CM | POA: Insufficient documentation

## 2021-04-22 DIAGNOSIS — C641 Malignant neoplasm of right kidney, except renal pelvis: Secondary | ICD-10-CM

## 2021-04-22 DIAGNOSIS — N1330 Unspecified hydronephrosis: Secondary | ICD-10-CM | POA: Insufficient documentation

## 2021-04-22 DIAGNOSIS — Z7952 Long term (current) use of systemic steroids: Secondary | ICD-10-CM | POA: Insufficient documentation

## 2021-04-22 DIAGNOSIS — E032 Hypothyroidism due to medicaments and other exogenous substances: Secondary | ICD-10-CM

## 2021-04-22 DIAGNOSIS — C649 Malignant neoplasm of unspecified kidney, except renal pelvis: Secondary | ICD-10-CM

## 2021-04-22 DIAGNOSIS — C787 Secondary malignant neoplasm of liver and intrahepatic bile duct: Secondary | ICD-10-CM | POA: Diagnosis present

## 2021-04-22 LAB — CMP (CANCER CENTER ONLY)
ALT: 23 U/L (ref 0–44)
AST: 21 U/L (ref 15–41)
Albumin: 1.7 g/dL — ABNORMAL LOW (ref 3.5–5.0)
Alkaline Phosphatase: 108 U/L (ref 38–126)
Anion gap: 12 (ref 5–15)
BUN: 6 mg/dL — ABNORMAL LOW (ref 8–23)
CO2: 25 mmol/L (ref 22–32)
Calcium: 8.9 mg/dL (ref 8.9–10.3)
Chloride: 105 mmol/L (ref 98–111)
Creatinine: 0.66 mg/dL (ref 0.61–1.24)
GFR, Estimated: >60 mL/min (ref >60)
Glucose, Bld: 107 mg/dL — ABNORMAL HIGH (ref 70–99)
Potassium: 3.2 mmol/L — ABNORMAL LOW (ref 3.5–5.1)
Sodium: 142 mmol/L (ref 135–145)
Total Bilirubin: <0.2 mg/dL — ABNORMAL LOW (ref 0.3–1.2)
Total Protein: 7.4 g/dL (ref 6.5–8.1)

## 2021-04-22 LAB — CBC WITH DIFFERENTIAL (CANCER CENTER ONLY)
Abs Immature Granulocytes: 0.1 10*3/uL — ABNORMAL HIGH (ref 0.00–0.07)
Basophils Absolute: 0.1 10*3/uL (ref 0.0–0.1)
Basophils Relative: 1 %
Eosinophils Absolute: 0.1 10*3/uL (ref 0.0–0.5)
Eosinophils Relative: 1 %
HCT: 34.3 % — ABNORMAL LOW (ref 39.0–52.0)
Hemoglobin: 10.4 g/dL — ABNORMAL LOW (ref 13.0–17.0)
Immature Granulocytes: 1 %
Lymphocytes Relative: 33 %
Lymphs Abs: 4 10*3/uL (ref 0.7–4.0)
MCH: 23.7 pg — ABNORMAL LOW (ref 26.0–34.0)
MCHC: 30.3 g/dL (ref 30.0–36.0)
MCV: 78.1 fL — ABNORMAL LOW (ref 80.0–100.0)
Monocytes Absolute: 0.9 10*3/uL (ref 0.1–1.0)
Monocytes Relative: 7 %
Neutro Abs: 6.9 10*3/uL (ref 1.7–7.7)
Neutrophils Relative %: 57 %
Platelet Count: 470 10*3/uL — ABNORMAL HIGH (ref 150–400)
RBC: 4.39 MIL/uL (ref 4.22–5.81)
RDW: 28.7 % — ABNORMAL HIGH (ref 11.5–15.5)
WBC Count: 12 10*3/uL — ABNORMAL HIGH (ref 4.0–10.5)
nRBC: 0.6 % — ABNORMAL HIGH (ref 0.0–0.2)

## 2021-04-22 LAB — SAMPLE TO BLOOD BANK

## 2021-04-22 NOTE — Progress Notes (Signed)
Hematology and Oncology Follow Up Visit  Chad Cantu [id number] Aug 24, 1951 70 y.o. 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   Prior Therapy:  He is status post ultrasound biopsy of the hepatic lesion on December 01, 2020.  The final pathology confirmed the presence of metastatic clear-cell renal cell carcinoma. Ipilimumab 1 mg/kg and nivolumab 3 mg/kg therapy is started on December 23, 2019.  He completed 3 cycles of therapy with progression of disease.    Current therapy: Cabometyx 40 mg daily started on March 04, 2021.  Interim History: Chad Cantu presents today for return evaluation.  Since the last visit, he has reports that no major changes in his health.  He did report some mild symptoms of mucositis which resolved after brief treatment interruption.  Otherwise he tolerated Cabometyx without any new complaints.  He denies any nausea, vomiting or diarrhea.  He is eating much better and has gained close to 17 pounds.  He has been using Megace to boost his appetite in the interim.  He denies any bone pain or pathological fractures.     Medications: Updated on review. Current Outpatient Medications  Medication Sig Dispense Refill  . acetaminophen (TYLENOL) 325 MG tablet Take 2 tablets (650 mg total) by mouth every 6 (six) hours as needed for mild pain (or Fever >/= 101).    . Ascorbic Acid (VITAMIN C PO) Take by mouth.    Marland Kitchen atorvastatin (LIPITOR) 20 MG tablet Take 1 tablet (20 mg total) by mouth daily. 90 tablet 3  . atorvastatin (LIPITOR) 20 MG tablet TAKE 1 TABLET BY MOUTH DAILY. 90 tablet 2  . CABOMETYX 40 MG tablet TAKE 1 TABLET (40 MG TOTAL) DAILY. TAKE ON AN EMPTY STOMACH 1 HOUR BEFORE OR 2 HOURS AFTER MEALS 30 tablet 0  . dexamethasone (DECADRON) 1 MG tablet TAKE 1 TABLET BY MOUTH AT 11PM,  PRIOR TO THE 8AM CORTISOL LAB DRAW 1 tablet 0  . ferrous sulfate 325 (65 FE) MG EC tablet Take 1 tablet (325 mg total) by mouth in the morning and at bedtime. 60 tablet 3  . ferrous sulfate 325 (65 FE) MG tablet TAKE 1 TABLET (325 MG TOTAL) BY MOUTH IN THE MORNING AND AT BEDTIME. 60 tablet 3  . lisinopril-hydrochlorothiazide (ZESTORETIC) 10-12.5 MG tablet TAKE 1 TABLET BY MOUTH DAILY. 90 tablet 2  . megestrol (MEGACE) 40 MG/ML suspension TAKE 10 MLS (400 MG TOTAL) BY MOUTH 2 (TWO) TIMES DAILY. 240 mL 0  . megestrol (MEGACE) 40 MG/ML suspension TAKE 10ML BY MOUTH EVERY DAY 900 mL 1  . metFORMIN (GLUCOPHAGE) 500 MG tablet TAKE 1 TABLET (500 MG TOTAL) BY MOUTH DAILY WITH BREAKFAST. 90 tablet 2  . mirtazapine (REMERON) 7.5 MG tablet Take 1 tablet (7.5 mg total) by mouth at bedtime. 30 tablet 3   No current facility-administered medications for this visit.     Allergies: No Known Allergies   Physical Exam:   Blood pressure (!) 133/95, pulse (!) 122, temperature 97.7 F (36.5 C), temperature source Tympanic, resp. rate 16, height 5\' 6"  (1.676 m), weight 137 lb 14.4 oz (62.6 kg), SpO2 100 %.     ECOG: 1    General appearance: Comfortable appearing without any discomfort Head: Normocephalic without any trauma Oropharynx: Mucous membranes are moist and pink without any thrush or ulcers. Eyes: Pupils are equal and round reactive  to light. Lymph nodes: No cervical, supraclavicular, inguinal or axillary lymphadenopathy.   Heart:regular rate and rhythm.  S1 and S2 without leg edema. Lung: Clear without any rhonchi or wheezes.  No dullness to percussion. Abdomin: Soft, nontender, nondistended with good bowel sounds.  No hepatosplenomegaly. Musculoskeletal: No joint deformity or effusion.  Full range of motion noted. Neurological: No deficits noted on motor, sensory and deep tendon reflex exam. Skin: No petechial rash or dryness.  Appeared moist.        Lab Results: Lab Results   Component Value Date   WBC 12.6 (H) 03/17/2021   HGB 8.9 (L) 03/17/2021   HCT 29.3 (L) 03/17/2021   MCV 72.3 (L) 03/17/2021   PLT 611 (H) 03/17/2021     Chemistry      Component Value Date/Time   NA 137 03/17/2021 1159   NA 139 12/25/2020 1236   K 4.4 03/17/2021 1159   CL 104 03/17/2021 1159   CO2 21 (L) 03/17/2021 1159   BUN 8 03/17/2021 1159   BUN 11 12/25/2020 1236   CREATININE 0.79 03/17/2021 1159   CREATININE 0.80 01/16/2016 1052      Component Value Date/Time   CALCIUM 8.6 (L) 03/17/2021 1159   ALKPHOS 105 03/17/2021 1159   AST 21 03/17/2021 1159   ALT 25 03/17/2021 1159   BILITOT <0.2 (L) 03/17/2021 1159       Impression and Plan:  70 year old with:  1.  Sage IV clear-cell renal cell carcinoma with hepatic and pulmonary disease diagnosed in December 2021 with intermediate risk disease.   He is currently on that cabozantinib with a few complications including mucositis associated with this treatment.  Risks and benefits of resuming this treatment versus alternative treatment options were discussed.  Dose reduction to 20 mg versus alternative option with Inlyta could be considered.  He has tolerated the 40 mg restart at a reasonable level without any major complaints.  We will continue to monitor and keep him at the current dose and schedule.  We will update his staging scans in July 2022.  2.  Poor nutritional status: Improved at this time with medications and gained more weight.   3.  Hydronephrosis: His kidney function remained stable without any further decline.  We will continue to monitor.   4.    GI complaints of nausea and abdominal pain: Resolved at this time.  5.  Goals of care and prognosis: His disease is incurable although aggressive measures are warranted at this time.  Any treatment is palliative however.  6.  Anemia: Multifactorial in nature related to chronic disease and malignancy.  Hemoglobin stable without any intervention.   Hemoglobin reviewed today and continues to improve up to 10.4.  7.  Hypercalcemia:  calcium on April 5 expected close to normal range.  He is status post Zometa which will be repeated as needed.  8.  Follow-up: In 5 weeks for repeat follow-up.  30  minutes were spent on this visit.  The time was spent on reviewing disease status, laboratory data review and future plan of care discussion.    Zola Button, MD 5/11/20222:50 PM

## 2021-04-23 LAB — TSH: TSH: 2.585 u[IU]/mL (ref 0.320–4.118)

## 2021-04-29 ENCOUNTER — Other Ambulatory Visit: Payer: Self-pay | Admitting: Oncology

## 2021-04-29 DIAGNOSIS — C641 Malignant neoplasm of right kidney, except renal pelvis: Secondary | ICD-10-CM

## 2021-05-06 ENCOUNTER — Other Ambulatory Visit: Payer: Self-pay

## 2021-05-08 ENCOUNTER — Telehealth: Payer: Self-pay | Admitting: Dietician

## 2021-05-08 NOTE — Telephone Encounter (Signed)
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 He is taking daily oral chemotherapy.  Spoke with wife of patient via telephone. She reports patient is doing "great". He continues to have a good appetite and eats "very well" Wife notes 17 lb weight gain over the past 6weeks. Patient has been cooking some meals for himself and energy level is increased. Wife recalls patient recently has had spaghetti with meatballs, steak, mashed potatoes, baked ziti, pot roast, potato salad. Patient has not been drinking Ensure supplement lately, but she "makes sure he stays hydrated" He is drinking a lot of water and likes gatorade. They are looking forward to going to their great grandsons birthday party at the park this weekend.    Medications: reviewed  Labs: 5/11 K 3.2, Glucose 107, BUN 6, Albumin 1.6  Anthropometrics: Weight 137 lb 14.4 oz increased from 120 lb on 3/25  3/10 - 131 lb 6.3 oz 2/21 - 131 lb 4.8 oz 1/13 - 144 lb 6.4 oz   NUTRITION DIAGNOSIS: Inadequate oral intake improved   INTERVENTION:  Encouraged continuing to eat high calorie, high protein foods for weight maintenance Pt will drink Ensure supplement as needed with decreased appetite Encouraged activity as able  MONITORING, EVALUATION, GOAL: weight trends, intake   NEXT VISIT: telephone call ~3-4 weeks

## 2021-05-12 ENCOUNTER — Other Ambulatory Visit: Payer: Self-pay

## 2021-05-12 MED FILL — Metformin HCl Tab 500 MG: ORAL | 90 days supply | Qty: 90 | Fill #0 | Status: AC

## 2021-05-23 ENCOUNTER — Other Ambulatory Visit: Payer: Self-pay | Admitting: Oncology

## 2021-05-23 DIAGNOSIS — C641 Malignant neoplasm of right kidney, except renal pelvis: Secondary | ICD-10-CM

## 2021-05-25 ENCOUNTER — Encounter: Payer: Self-pay | Admitting: Hematology

## 2021-05-27 ENCOUNTER — Telehealth: Payer: Self-pay | Admitting: *Deleted

## 2021-05-27 NOTE — Telephone Encounter (Signed)
Ms. Gargis called re: Patient has reddened areas like blisters at outside corners of mouth. He also has blister like red,bruised areas on his fingers. She said he is blowing his nose a lot and has had some scabs come out. Ms. Lumm wants to know if he should continue the Carbometyx 40 mg daily?  Call/question routed to Dr. Alen Blew

## 2021-05-27 NOTE — Telephone Encounter (Signed)
Contacted ms. Hulme with Dr. Hazeline Junker response:   Please instruct the patient to hold the medication till his next MD evaluation.  Ms. Connors verbalized understanding of instructions.

## 2021-06-08 ENCOUNTER — Inpatient Hospital Stay (HOSPITAL_BASED_OUTPATIENT_CLINIC_OR_DEPARTMENT_OTHER): Payer: Medicare Other | Admitting: Oncology

## 2021-06-08 ENCOUNTER — Inpatient Hospital Stay: Payer: Medicare Other | Attending: Oncology

## 2021-06-08 ENCOUNTER — Other Ambulatory Visit: Payer: Self-pay

## 2021-06-08 VITALS — BP 147/95 | HR 114 | Temp 98.5°F | Resp 18 | Ht 66.0 in | Wt 155.4 lb

## 2021-06-08 DIAGNOSIS — C641 Malignant neoplasm of right kidney, except renal pelvis: Secondary | ICD-10-CM

## 2021-06-08 DIAGNOSIS — D500 Iron deficiency anemia secondary to blood loss (chronic): Secondary | ICD-10-CM

## 2021-06-08 DIAGNOSIS — Z7952 Long term (current) use of systemic steroids: Secondary | ICD-10-CM | POA: Diagnosis not present

## 2021-06-08 DIAGNOSIS — C649 Malignant neoplasm of unspecified kidney, except renal pelvis: Secondary | ICD-10-CM | POA: Insufficient documentation

## 2021-06-08 DIAGNOSIS — D630 Anemia in neoplastic disease: Secondary | ICD-10-CM | POA: Diagnosis not present

## 2021-06-08 DIAGNOSIS — Z7984 Long term (current) use of oral hypoglycemic drugs: Secondary | ICD-10-CM | POA: Insufficient documentation

## 2021-06-08 DIAGNOSIS — C787 Secondary malignant neoplasm of liver and intrahepatic bile duct: Secondary | ICD-10-CM | POA: Insufficient documentation

## 2021-06-08 DIAGNOSIS — C7800 Secondary malignant neoplasm of unspecified lung: Secondary | ICD-10-CM | POA: Diagnosis present

## 2021-06-08 DIAGNOSIS — Z79899 Other long term (current) drug therapy: Secondary | ICD-10-CM | POA: Diagnosis not present

## 2021-06-08 DIAGNOSIS — C642 Malignant neoplasm of left kidney, except renal pelvis: Secondary | ICD-10-CM | POA: Diagnosis present

## 2021-06-08 DIAGNOSIS — E032 Hypothyroidism due to medicaments and other exogenous substances: Secondary | ICD-10-CM

## 2021-06-08 LAB — CBC WITH DIFFERENTIAL (CANCER CENTER ONLY)
Abs Immature Granulocytes: 0.01 10*3/uL (ref 0.00–0.07)
Basophils Absolute: 0 10*3/uL (ref 0.0–0.1)
Basophils Relative: 0 %
Eosinophils Absolute: 0.1 10*3/uL (ref 0.0–0.5)
Eosinophils Relative: 2 %
HCT: 32.9 % — ABNORMAL LOW (ref 39.0–52.0)
Hemoglobin: 10.2 g/dL — ABNORMAL LOW (ref 13.0–17.0)
Immature Granulocytes: 0 %
Lymphocytes Relative: 43 %
Lymphs Abs: 3.1 10*3/uL (ref 0.7–4.0)
MCH: 26.9 pg (ref 26.0–34.0)
MCHC: 31 g/dL (ref 30.0–36.0)
MCV: 86.8 fL (ref 80.0–100.0)
Monocytes Absolute: 0.8 10*3/uL (ref 0.1–1.0)
Monocytes Relative: 11 %
Neutro Abs: 3.2 10*3/uL (ref 1.7–7.7)
Neutrophils Relative %: 44 %
Platelet Count: 324 10*3/uL (ref 150–400)
RBC: 3.79 MIL/uL — ABNORMAL LOW (ref 4.22–5.81)
RDW: 27.4 % — ABNORMAL HIGH (ref 11.5–15.5)
WBC Count: 7.2 10*3/uL (ref 4.0–10.5)
nRBC: 0 % (ref 0.0–0.2)

## 2021-06-08 LAB — TSH: TSH: 3.508 u[IU]/mL (ref 0.320–4.118)

## 2021-06-08 LAB — CMP (CANCER CENTER ONLY)
ALT: 12 U/L (ref 0–44)
AST: 13 U/L — ABNORMAL LOW (ref 15–41)
Albumin: 1.8 g/dL — ABNORMAL LOW (ref 3.5–5.0)
Alkaline Phosphatase: 66 U/L (ref 38–126)
Anion gap: 10 (ref 5–15)
BUN: 8 mg/dL (ref 8–23)
CO2: 22 mmol/L (ref 22–32)
Calcium: 9 mg/dL (ref 8.9–10.3)
Chloride: 109 mmol/L (ref 98–111)
Creatinine: 0.63 mg/dL (ref 0.61–1.24)
GFR, Estimated: >60 mL/min (ref >60)
Glucose, Bld: 80 mg/dL (ref 70–99)
Potassium: 3.7 mmol/L (ref 3.5–5.1)
Sodium: 141 mmol/L (ref 135–145)
Total Bilirubin: <0.2 mg/dL — ABNORMAL LOW (ref 0.3–1.2)
Total Protein: 7.3 g/dL (ref 6.5–8.1)

## 2021-06-08 LAB — SAMPLE TO BLOOD BANK

## 2021-06-08 NOTE — Progress Notes (Signed)
Hematology and Oncology Follow Up Visit  Chad Cantu [id number] December 26, 1950 70 y.o. 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  He has a intermediate risk disease.   Prior Therapy:  He is status post ultrasound biopsy of the hepatic lesion on December 01, 2020.  The final pathology confirmed the presence of metastatic clear-cell renal cell carcinoma.  Ipilimumab 1 mg/kg and nivolumab 3 mg/kg therapy is started on December 23, 2019.  He completed 3 cycles of therapy with progression of disease.    Current therapy: Cabometyx 40 mg daily started on March 04, 2021.  Interim History: Chad Cantu returns today for a follow-up visit.  Since the last visit, he reports feeling well without any major complaints.  He developed irritation and pain in his fingers and nails with blisters on his fingers related to Cabometyx.  Therapy with held in the last 2 weeks with improvement in his symptoms.  He is otherwise reporting no other complaints.  He denies any nausea vomiting or abdominal pain.  He is eating better and has gained more weight.  He is no longer reporting any excessive fatigue, weakness or mental lethargy.  Quality of life has improved at this time.     Medications: Unchanged on review. Current Outpatient Medications  Medication Sig Dispense Refill   acetaminophen (TYLENOL) 325 MG tablet Take 2 tablets (650 mg total) by mouth every 6 (six) hours as needed for mild pain (or Fever >/= 101).     Ascorbic Acid (VITAMIN C PO) Take by mouth.     atorvastatin (LIPITOR) 20 MG tablet Take 1 tablet (20 mg total) by mouth daily. 90 tablet 3   atorvastatin (LIPITOR) 20 MG tablet TAKE 1 TABLET BY MOUTH DAILY. 90 tablet 2   CABOMETYX 40 MG tablet TAKE 1 TABLET (40 MG TOTAL) DAILY. TAKE ON AN EMPTY STOMACH 1 HOUR BEFORE OR 2 HOURS  AFTER MEALS 30 tablet 0   dexamethasone (DECADRON) 1 MG tablet TAKE 1 TABLET BY MOUTH AT 11PM, PRIOR TO THE 8AM CORTISOL LAB DRAW 1 tablet 0   ferrous sulfate 325 (65 FE) MG EC tablet Take 1 tablet (325 mg total) by mouth in the morning and at bedtime. 60 tablet 3   ferrous sulfate 325 (65 FE) MG tablet TAKE 1 TABLET (325 MG TOTAL) BY MOUTH IN THE MORNING AND AT BEDTIME. 60 tablet 3   lisinopril-hydrochlorothiazide (ZESTORETIC) 10-12.5 MG tablet TAKE 1 TABLET BY MOUTH DAILY. 90 tablet 2   megestrol (MEGACE) 40 MG/ML suspension TAKE 10 MLS (400 MG TOTAL) BY MOUTH 2 (TWO) TIMES DAILY. 240 mL 0   megestrol (MEGACE) 40 MG/ML suspension TAKE 10ML BY MOUTH EVERY DAY 900 mL 1   metFORMIN (GLUCOPHAGE) 500 MG tablet TAKE 1 TABLET (500 MG TOTAL) BY MOUTH DAILY WITH BREAKFAST. 90 tablet 2   mirtazapine (REMERON) 7.5 MG tablet Take 1 tablet (7.5 mg total) by mouth at bedtime. 30 tablet 3   No current facility-administered medications for this visit.     Allergies: No Known Allergies   Physical Exam:    Blood pressure (!) 147/95, pulse (!) 114, temperature 98.5 F (36.9 C), temperature source Oral, resp. rate 18, height 5\' 6"  (1.676 m), weight 155 lb 6.4 oz (70.5 kg), SpO2 100 %.     ECOG: 1     General appearance: Alert, awake without any distress. Head:  Atraumatic without abnormalities Oropharynx: Without any thrush or ulcers. Eyes: No scleral icterus. Lymph nodes: No lymphadenopathy noted in the cervical, supraclavicular, or axillary nodes Heart:regular rate and rhythm, without any murmurs or gallops.   Lung: Clear to auscultation without any rhonchi, wheezes or dullness to percussion. Abdomin: Soft, nontender without any shifting dullness or ascites. Musculoskeletal: No clubbing or cyanosis. Neurological: No motor or sensory deficits. Dermatological exam: Well-healed ulcers on his fingers without any desquamation or blisters.         Lab Results: Lab Results  Component  Value Date   WBC 12.0 (H) 04/22/2021   HGB 10.4 (L) 04/22/2021   HCT 34.3 (L) 04/22/2021   MCV 78.1 (L) 04/22/2021   PLT 470 (H) 04/22/2021     Chemistry      Component Value Date/Time   NA 142 04/22/2021 1453   NA 139 12/25/2020 1236   K 3.2 (L) 04/22/2021 1453   CL 105 04/22/2021 1453   CO2 25 04/22/2021 1453   BUN 6 (L) 04/22/2021 1453   BUN 11 12/25/2020 1236   CREATININE 0.66 04/22/2021 1453   CREATININE 0.80 01/16/2016 1052      Component Value Date/Time   CALCIUM 8.9 04/22/2021 1453   ALKPHOS 108 04/22/2021 1453   AST 21 04/22/2021 1453   ALT 23 04/22/2021 1453   BILITOT <0.2 (L) 04/22/2021 1453       Impression and Plan:  70 year old man with:   1.  Intermediate risk Sage IV clear-cell renal cell carcinoma with hepatic and pulmonary disease diagnosed in December 2021.    His disease status was updated at this time and treatment choices were discussed.  He continues to tolerate Cabometyx without any recent complaints.  Risks and benefits of continuing this treatment were discussed at this time.  Potential complications that include hypertension, weight loss, diarrhea and other GI complaints were reiterated.    Given the dermatological toxicities that he experienced with painful hand-foot syndrome associated with blistering.  I will resume Cabometyx at 20 mg for better tolerance and he is agreeable to proceed.  The plan is to update his staging scans before the next visit.  Alternative treatment options would include different oral targeted therapy including axitinib, pazopanib among others.   2.  Weight loss: Resolved at this time he is eating better and continues to gain weight.     3.  Hydronephrosis: Related to his malignancy with kidney function overall stable.     4.    Dermatological toxicity: Hand-foot syndrome manifesting with blisters without desquamation.  Improved with therapy with holding.  We will resume Cabometyx at 20 mg daily.   5.  Goals of  care and prognosis: Therapy remains palliative although aggressive measures are warranted given his reasonable performance status.   6.  Anemia: Related to malignancy and possible bleeding.  Hemoglobin has stabilized at this time.  7.  Hypercalcemia: Related to malignancy and normalized at this time.  His calcium today is 9.0.   8.  Follow-up: 6 weeks for repeat follow-up.   30  minutes were dedicated to this encounter.  The time was spent on reviewing laboratory data, complications related to his cancer, cancer therapy and future plan of care discussion.      Zola Button, MD 6/27/20229:04 AM

## 2021-06-18 ENCOUNTER — Ambulatory Visit (HOSPITAL_COMMUNITY)
Admission: RE | Admit: 2021-06-18 | Discharge: 2021-06-18 | Disposition: A | Discharge status: Home / Self Care | Payer: Medicare Other | Source: Ambulatory Visit | Attending: Oncology | Admitting: Oncology

## 2021-06-18 ENCOUNTER — Encounter (HOSPITAL_COMMUNITY): Payer: Self-pay

## 2021-06-18 ENCOUNTER — Other Ambulatory Visit: Payer: Self-pay | Admitting: Oncology

## 2021-06-18 ENCOUNTER — Other Ambulatory Visit: Payer: Self-pay

## 2021-06-18 DIAGNOSIS — C641 Malignant neoplasm of right kidney, except renal pelvis: Secondary | ICD-10-CM | POA: Diagnosis present

## 2021-06-18 MED ORDER — CABOMETYX 20 MG PO TABS: 20.0000 mg | ORAL_TABLET | Freq: Every day | ORAL | 0 refills | Status: DC

## 2021-06-18 MED ORDER — IOHEXOL 300 MG/ML  SOLN
100.0000 mL | Freq: Once | INTRAMUSCULAR | Status: AC | PRN
  Administered 2021-06-18: 100 mL via INTRAVENOUS

## 2021-06-22 ENCOUNTER — Telehealth: Payer: Self-pay | Admitting: *Deleted

## 2021-06-22 NOTE — Telephone Encounter (Signed)
PC to patient's wife [name], informed her new rx for Cabometyx 20 mg was sent to pharmacy on 06/18/21.  Instructed her to bring his cabometyx 40 mg tablets to cancer center at his next appointment for proper disposal.  She verbalizes understanding.

## 2021-06-22 NOTE — Telephone Encounter (Signed)
-----   Message from [name], MD sent at 06/17/2021  4:27 PM EDT ----- Yes to 20 mg refill.  They can bring 40 mg tabs with them next time. Thanks ----- Message ----- From: Rolene Course, RN Sent: 06/17/2021   3:52 PM EDT To: [name], MD  Would you like me to send a rx through for Cabometyx 20 mg?  His wife called & they only have 40 mg tablets.  Just let me know.  Thanks, Bethena Roys

## 2021-06-23 ENCOUNTER — Telehealth: Payer: Self-pay

## 2021-06-23 NOTE — Telephone Encounter (Signed)
I received a call from Mrs. Oshita today requesting to know the status of Mr. Marcos Ruelas prescription that was written and sent in on 06/18/21 to [facility]. I contacted [facility] after receiving her voicemail to gain an update on the status of the prescription. I was informed that the pharmacist needed clarification as to why the Cabometyx was recently decreased from 40 mg to 20 mg. I informed Caryl Pina the pharmacist with [facility] that the decrease to 20 mg of Cabometyx was made due to hand-foot syndrome manifesting with blisters without desquamation. The pharmacist verbalized understanding, stated that she would be reaching out to the patient today to schedule delivery, and also confirmed that the prescription would be sent out today. I contacted Mrs. Waren to notify her of this and to advise her to be on the lookout for a phone call from [facility] to schedule delivery.

## 2021-06-24 ENCOUNTER — Telehealth: Payer: Self-pay | Admitting: *Deleted

## 2021-06-24 NOTE — Telephone Encounter (Signed)
Received VM from patient's wife stating she had not heard anything from [facility] regarding the patient's new cabometyx prescription.  I called [facility], they stated they tried to contact patient yesterday & will try again today.  I contacted Mrs. Becker to let her know & to give her the number [facility] recommends that she call if she wants: [phone]  She verbalizes understanding.

## 2021-07-05 ENCOUNTER — Other Ambulatory Visit: Payer: Self-pay | Admitting: Oncology

## 2021-07-06 ENCOUNTER — Encounter: Payer: Self-pay | Admitting: Hematology

## 2021-07-10 ENCOUNTER — Telehealth: Payer: Self-pay | Admitting: Oncology

## 2021-07-10 NOTE — Telephone Encounter (Signed)
Called patient regarding upcoming 08/15 appointment, left a voicemail.

## 2021-07-13 ENCOUNTER — Telehealth: Payer: Self-pay | Admitting: *Deleted

## 2021-07-13 ENCOUNTER — Other Ambulatory Visit: Payer: Self-pay

## 2021-07-13 NOTE — Telephone Encounter (Signed)
Routing telephone inquiry to covering MD (Dr Julien Nordmann) as Dr Alen Blew is on [facility].  Patient's wife called stating that the megestrol 40 mg once daily is not working,  Over the past 3 weeks she has noticed progressive decreased appetite and increasing dizziness.  She is able to get patient to drink Gatorade and ensure however, he is sweating a lot of night.  She wanted to know if the megestrol could be increased or if an alternative medication could be ordered.

## 2021-07-14 ENCOUNTER — Telehealth: Payer: Self-pay | Admitting: Dietician

## 2021-07-14 ENCOUNTER — Encounter: Payer: Medicare Other | Admitting: Dietician

## 2021-07-14 NOTE — Telephone Encounter (Signed)
Nutrition Follow-up:  Patient with stage IV renal cancer metastatic to hepatic and pulmonary. He is taking daily oral chemotherapy.   Spoke with wife via telephone. She reports appetite has been poor, patient having night sweats and feeling dizzy over the past few weeks. Wife reports this is starting to improve and will be seeing Dr. Alen Blew on 8/15. Wife reports patient was eating very little, drinking 3 Ensure Max (150 kcal, 30 g protein) and Powerade for the electrolytes. Wife felt dizzy spells related to dehydration from sweating. She reports appetite has improved, eating small portions of foods and drinking supplements. Yesterday he had an ensure max for breakfast, small plate of candied yams, pot roast, sweet potato pie for dinner. He ate grits, eggs with cheese, 2 slices of tomatoes for breakfast and small piece of pie for lunch.   Medications: reviewed  Labs: 6/27 - AST 13, Albumin 1.8  Anthropometrics: Last weight 155 lb 6.4 oz on 6/27 increased ~17 lbs in 6 weeks (? Accuracy of last weight)  5/11- 137 lb 14.4 oz 3/25 - 120 lb   NUTRITION DIAGNOSIS: Inadequate oral intake ongoing   INTERVENTION:  Encouraged small frequent meals and snacks that are high in calories and protein Discussed strategies for poor appetite, will mail handout Continue drinking 2-3 Ensure supplements daily with decreased appetite, recommended switching to Ensure Complete for added calories Will mail Ensure coupons Encouraged activity as able Contact information provided     MONITORING, EVALUATION, GOAL: weight trends, intake   NEXT VISIT: via telephone ~4-5 weeks

## 2021-07-14 NOTE — Telephone Encounter (Signed)
Communicated response to patient's wife.  She stated understanding and will call next week if this does not show improvement.

## 2021-07-20 ENCOUNTER — Telehealth: Payer: Self-pay | Admitting: *Deleted

## 2021-07-20 NOTE — Telephone Encounter (Signed)
Patients wife called requesting refill of Cabometyx to [facility].  States they only have 8 pills left.  Routed refill request to provider.

## 2021-07-21 ENCOUNTER — Other Ambulatory Visit: Payer: Self-pay | Admitting: *Deleted

## 2021-07-21 MED ORDER — CABOMETYX 20 MG PO TABS: ORAL_TABLET | ORAL | 0 refills | Status: DC

## 2021-07-23 ENCOUNTER — Emergency Department (HOSPITAL_COMMUNITY)
Admission: EM | Admit: 2021-07-23 | Discharge: 2021-07-23 | Disposition: A | Discharge status: Home / Self Care | Payer: Medicare Other | Attending: Emergency Medicine | Admitting: Emergency Medicine

## 2021-07-23 ENCOUNTER — Other Ambulatory Visit: Payer: Self-pay

## 2021-07-23 ENCOUNTER — Encounter (HOSPITAL_COMMUNITY): Payer: Self-pay

## 2021-07-23 ENCOUNTER — Emergency Department (HOSPITAL_COMMUNITY): Payer: Medicare Other

## 2021-07-23 DIAGNOSIS — E119 Type 2 diabetes mellitus without complications: Secondary | ICD-10-CM | POA: Insufficient documentation

## 2021-07-23 DIAGNOSIS — C641 Malignant neoplasm of right kidney, except renal pelvis: Secondary | ICD-10-CM | POA: Diagnosis not present

## 2021-07-23 DIAGNOSIS — R42 Dizziness and giddiness: Secondary | ICD-10-CM | POA: Diagnosis not present

## 2021-07-23 DIAGNOSIS — R531 Weakness: Secondary | ICD-10-CM

## 2021-07-23 DIAGNOSIS — D649 Anemia, unspecified: Secondary | ICD-10-CM | POA: Insufficient documentation

## 2021-07-23 DIAGNOSIS — Z87891 Personal history of nicotine dependence: Secondary | ICD-10-CM | POA: Insufficient documentation

## 2021-07-23 DIAGNOSIS — Z7984 Long term (current) use of oral hypoglycemic drugs: Secondary | ICD-10-CM | POA: Insufficient documentation

## 2021-07-23 DIAGNOSIS — Z79899 Other long term (current) drug therapy: Secondary | ICD-10-CM | POA: Diagnosis not present

## 2021-07-23 DIAGNOSIS — I10 Essential (primary) hypertension: Secondary | ICD-10-CM | POA: Insufficient documentation

## 2021-07-23 DIAGNOSIS — D509 Iron deficiency anemia, unspecified: Secondary | ICD-10-CM

## 2021-07-23 LAB — CBC WITH DIFFERENTIAL/PLATELET
Abs Immature Granulocytes: 0.08 10*3/uL — ABNORMAL HIGH (ref 0.00–0.07)
Basophils Absolute: 0.1 10*3/uL (ref 0.0–0.1)
Basophils Relative: 1 %
Eosinophils Absolute: 0.1 10*3/uL (ref 0.0–0.5)
Eosinophils Relative: 1 %
HCT: 29.2 % — ABNORMAL LOW (ref 39.0–52.0)
Hemoglobin: 9.1 g/dL — ABNORMAL LOW (ref 13.0–17.0)
Immature Granulocytes: 1 %
Lymphocytes Relative: 29 %
Lymphs Abs: 3.2 10*3/uL (ref 0.7–4.0)
MCH: 27.1 pg (ref 26.0–34.0)
MCHC: 31.2 g/dL (ref 30.0–36.0)
MCV: 86.9 fL (ref 80.0–100.0)
Monocytes Absolute: 0.9 10*3/uL (ref 0.1–1.0)
Monocytes Relative: 9 %
Neutro Abs: 6.6 10*3/uL (ref 1.7–7.7)
Neutrophils Relative %: 59 %
Platelets: 585 10*3/uL — ABNORMAL HIGH (ref 150–400)
RBC: 3.36 MIL/uL — ABNORMAL LOW (ref 4.22–5.81)
RDW: 19.5 % — ABNORMAL HIGH (ref 11.5–15.5)
WBC: 10.9 10*3/uL — ABNORMAL HIGH (ref 4.0–10.5)
nRBC: 0 % (ref 0.0–0.2)

## 2021-07-23 LAB — COMPREHENSIVE METABOLIC PANEL
ALT: 29 U/L (ref 0–44)
AST: 14 U/L — ABNORMAL LOW (ref 15–41)
Albumin: 2.2 g/dL — ABNORMAL LOW (ref 3.5–5.0)
Alkaline Phosphatase: 89 U/L (ref 38–126)
Anion gap: 8 (ref 5–15)
BUN: 14 mg/dL (ref 8–23)
CO2: 24 mmol/L (ref 22–32)
Calcium: 11.3 mg/dL — ABNORMAL HIGH (ref 8.9–10.3)
Chloride: 104 mmol/L (ref 98–111)
Creatinine, Ser: 0.8 mg/dL (ref 0.61–1.24)
GFR, Estimated: >60 mL/min (ref >60)
Glucose, Bld: 97 mg/dL (ref 70–99)
Potassium: 4.3 mmol/L (ref 3.5–5.1)
Sodium: 136 mmol/L (ref 135–145)
Total Bilirubin: 0.2 mg/dL — ABNORMAL LOW (ref 0.3–1.2)
Total Protein: 8.7 g/dL — ABNORMAL HIGH (ref 6.5–8.1)

## 2021-07-23 LAB — MAGNESIUM: Magnesium: 2 mg/dL (ref 1.7–2.4)

## 2021-07-23 MED ORDER — SODIUM CHLORIDE 0.9 % IV SOLN: INTRAVENOUS | Status: DC

## 2021-07-23 NOTE — ED Triage Notes (Addendum)
Weakness, fatigue, increase in sleeping habits, AMS, dizziness x 3 weeks. Hx of kidney cancer with mets to liver and lungs. Pt is on oral Chemo at this time. Denies N/V/D/headache/fever

## 2021-07-23 NOTE — ED Provider Notes (Signed)
[facility] DEPT Provider Note   CSN: [id number] Arrival date & time: 07/23/21  0732     History Chief Complaint  Patient presents with   Weakness    Quron Kelty is a 70 y.o. male.  Sacario Reetz has a history of stage IV renal carcinoma.  He is receiving treatment for this.  He presents with progressive weakness.  He reports that he feels dizzy which she describes as lightheaded, when he stands or walks.  He does ambulate without assistance, but he feels as if he needs to sit down when he ambulates even short distances.  He also feels dehydrated.  Despite drinking fluids and taking Ensure, he still feels dehydrated.  He urinates several times a day, but the urine is dark.  He also has been having some periods of confusion where he forgets what he is going to do or what time it is.  The history is provided by the patient.  Weakness Severity:  Moderate Onset quality:  Gradual Duration:  3 weeks Timing:  Constant Progression:  Worsening Chronicity:  Recurrent Context comment:  Stage IV kidney cancer, receiving chemo Relieved by:  Nothing Worsened by:  Activity Ineffective treatments:  Drinking fluids (Ensure supplementation) Associated symptoms: dizziness (light-headed)   Associated symptoms: no abdominal pain, no anorexia, no arthralgias, no chest pain, no cough, no diarrhea, no dysuria, no numbness in extremities, no fever, no frequency, no headaches, no loss of consciousness, no nausea, no seizures, no shortness of breath, no vision change and no vomiting       Past Medical History:  Diagnosis Date   Cancer ([place])    Diabetes mellitus without complication ([place])    ETOH abuse    Hyperlipidemia    Hypertension    Substance abuse ([place])    ETOH, cocaine.  Remission    Patient Active Problem List   Diagnosis Date Noted   Symptomatic anemia 02/18/2021   History of recent fall 12/25/2020   Hypercalcemia 12/22/2020   Metastatic renal cell  carcinoma ([place]) 12/09/2020   Goals of care, counseling/discussion 12/09/2020   AKI (acute kidney injury) ([place]) 12/09/2020   Leukocytosis 12/09/2020   Alcohol dependence with uncomplicated withdrawal ([place]) 11/16/2020   Near syncope 09/20/2020   Dehydration 09/20/2020   Alcohol abuse 09/20/2020   Alcohol use disorder, severe, dependence ([place])    Memory difficulty 05/02/2017   Rash, skin 05/02/2017   Overactive bladder 05/02/2017   Alcohol use disorder 05/01/2017   HLD (hyperlipidemia) 04/09/2015   Essential hypertension 04/08/2015   Tobacco abuse 04/08/2015    Past Surgical History:  Procedure Laterality Date   COLONOSCOPY  5 years ago?   at [facility] maybe per pt,?unsure results   LAPAROSCOPIC GASTROTOMY W/ REPAIR OF ULCER         Family History  Problem Relation Age of Onset   Diabetes Mother    Colon cancer Neg Hx    Colon polyps Neg Hx    Esophageal cancer Neg Hx    Rectal cancer Neg Hx    Stomach cancer Neg Hx     Social History   Tobacco Use   Smoking status: Former    Years: 55.00    Types: Cigarettes   Smokeless tobacco: Never  Vaping Use   Vaping Use: Never used  Substance Use Topics   Alcohol use: Not Currently    Alcohol/week: 12.0 standard drinks    Types: 12 Cans of beer per week   Drug use: Not Currently  Types: Marijuana    Comment: marijuana maybe 2 months ago, remote h/o cocaine use    Home Medications Prior to Admission medications   Medication Sig Start Date End Date Taking? Authorizing Provider  acetaminophen (TYLENOL) 325 MG tablet Take 2 tablets (650 mg total) by mouth every 6 (six) hours as needed for mild pain (or Fever >/= 101). 11/22/20   Domenic Polite, MD  Ascorbic Acid (VITAMIN C PO) Take by mouth.    [provider]  atorvastatin (LIPITOR) 20 MG tablet Take 1 tablet (20 mg total) by mouth daily. 06/05/20   [name], MD  atorvastatin (LIPITOR) 20 MG tablet TAKE 1 TABLET BY MOUTH DAILY. 03/25/20 03/25/21  Nolene Ebbs, MD  cabozantinib (CABOMETYX) 20 MG tablet TAKE 1 TABLET DAILY. TAKE ON AN EMPTY STOMACH, 1 HOUR BEFORE OR 2 HOURS AFTER MEALS. 07/21/21   [name], MD  dexamethasone (DECADRON) 1 MG tablet TAKE 1 TABLET BY MOUTH AT 11PM, PRIOR TO THE 8AM CORTISOL LAB DRAW 12/17/20 12/17/21  Janith Lima, MD  ferrous sulfate 325 (65 FE) MG EC tablet Take 1 tablet (325 mg total) by mouth in the morning and at bedtime. 02/02/21   [name], MD  ferrous sulfate 325 (65 FE) MG tablet TAKE 1 TABLET (325 MG TOTAL) BY MOUTH IN THE MORNING AND AT BEDTIME. 02/02/21 02/02/22  [name], MD  lisinopril-hydrochlorothiazide (ZESTORETIC) 10-12.5 MG tablet TAKE 1 TABLET BY MOUTH DAILY. 06/05/20 06/05/21  [name], MD  megestrol (MEGACE) 40 MG/ML suspension TAKE 10 MLS (400 MG TOTAL) BY MOUTH 2 (TWO) TIMES DAILY. 01/13/21 01/13/22  [name], MD  megestrol (MEGACE) 40 MG/ML suspension TAKE 10ML BY MOUTH EVERY DAY 04/08/21   [name], MD  metFORMIN (GLUCOPHAGE) 500 MG tablet TAKE 1 TABLET (500 MG TOTAL) BY MOUTH DAILY WITH BREAKFAST. 06/05/20 08/10/21  [name], MD  mirtazapine (REMERON) 7.5 MG tablet Take 1 tablet (7.5 mg total) by mouth at bedtime. 04/08/21   [name], MD  lisinopril (ZESTRIL) 20 MG tablet Take 20 mg by mouth daily. 03/26/20 09/20/20  [provider]  prochlorperazine (COMPAZINE) 10 MG tablet Take 1 tablet (10 mg total) by mouth every 6 (six) hours as needed for nausea or vomiting. Patient not taking: Reported on 02/18/2021 12/09/20 02/18/21  [name], MD    Allergies    Patient has no known allergies.  Review of Systems   Review of Systems  Constitutional:  Negative for chills and fever.  HENT:  Negative for ear pain and sore throat.   Eyes:  Negative for pain and visual disturbance.  Respiratory:  Negative for cough and shortness of breath.   Cardiovascular:  Negative for chest pain and palpitations.  Gastrointestinal:  Negative for abdominal  pain, anorexia, diarrhea, nausea and vomiting.  Genitourinary:  Negative for dysuria, frequency and hematuria.  Musculoskeletal:  Negative for arthralgias and back pain.  Skin:  Negative for color change and rash.  Neurological:  Positive for dizziness (light-headed) and weakness. Negative for seizures, loss of consciousness, syncope and headaches.  All other systems reviewed and are negative.  Physical Exam Updated Vital Signs BP 124/87   Pulse 75   Temp 98 F (36.7 C) (Oral)   Resp (P) 16   Ht '5\' 6"'$  (1.676 m)   Wt 70 kg   SpO2 100%   BMI 24.91 kg/m   Physical Exam Vitals and nursing note reviewed.  Constitutional:  Comments: Thin, pale  HENT:     Head: Normocephalic and atraumatic.  Cardiovascular:     Rate and Rhythm: Normal rate and regular rhythm.     Heart sounds: Normal heart sounds.  Pulmonary:     Effort: Pulmonary effort is normal.     Breath sounds: Normal breath sounds.  Abdominal:     General: There is no distension.     Tenderness: There is no abdominal tenderness. There is no guarding.  Musculoskeletal:     Cervical back: Normal range of motion.     Right lower leg: No edema.     Left lower leg: No edema.  Skin:    General: Skin is warm and dry.  Neurological:     General: No focal deficit present.     Mental Status: He is alert and oriented to person, place, and time.  Psychiatric:        Mood and Affect: Mood normal.        Behavior: Behavior normal.    ED Results / Procedures / Treatments   Labs (all labs ordered are listed, but only abnormal results are displayed) Labs Reviewed  COMPREHENSIVE METABOLIC PANEL - Abnormal; Notable for the following components:      Result Value   Calcium 11.3 (*)    Total Protein 8.7 (*)    Albumin 2.2 (*)    AST 14 (*)    Total Bilirubin 0.2 (*)    All other components within normal limits  CBC WITH DIFFERENTIAL/PLATELET - Abnormal; Notable for the following components:   WBC 10.9 (*)    RBC 3.36 (*)     Hemoglobin 9.1 (*)    HCT 29.2 (*)    RDW 19.5 (*)    Platelets 585 (*)    Abs Immature Granulocytes 0.08 (*)    All other components within normal limits  MAGNESIUM    EKG EKG Interpretation  Date/Time:  Thursday July 23 2021 09:13:28 EDT Ventricular Rate:  73 PR Interval:  152 QRS Duration: 83 QT Interval:  342 QTC Calculation: 377 R Axis:   3 Text Interpretation: Sinus rhythm Abnormal R-wave progression, early transition Borderline T abnormalities, lateral leads: bisphasic T waves slightly changed from prior No acute ischemia Axis normal Confirmed by Lorre Munroe ([id number]) on 07/23/2021 9:17:16 AM  Radiology CT Head Wo Contrast  Result Date: 07/23/2021 CLINICAL DATA:  Mental status change, unknown cause. Additional history provided: Weakness, fatigue, increased sleeping, altered mental status, dizziness for 3 weeks. Additional history provided: History of kidney cancer with metastases to liver and lungs, patient on oral chemotherapy at this time. EXAM: CT HEAD WITHOUT CONTRAST TECHNIQUE: Contiguous axial images were obtained from the base of the skull through the vertex without intravenous contrast. COMPARISON:  Head CT 02/18/2021. FINDINGS: Brain: Please note there is limited assessment for intracranial metastatic disease on this noncontrast head CT. Mild generalized cerebral atrophy. Mild patchy and ill-defined hypoattenuation within the cerebral white matter, nonspecific but most often secondary to chronic small vessel ischemia. There is no acute intracranial hemorrhage. No demarcated cortical infarct. No extra-axial fluid collection. No evidence of an intracranial mass. No midline shift. Vascular: No hyperdense vessel.  Atherosclerotic calcifications. Skull: Normal. Negative for fracture or focal suspicious osseous lesion. Sinuses/Orbits: Visualized orbits show no acute finding. Trace bilateral ethmoid sinus mucosal thickening. Other: 1.6 x 0.3 cm lipoma within the right frontal  scalp. IMPRESSION: Please note there is limited assessment for intracranial metastatic disease on this non-contrast head CT. No evidence of  acute intracranial abnormality. Mild chronic small vessel ischemic changes within the cerebral white matter. Mild generalized cerebral atrophy. Minimal bilateral ethmoid sinus mucosal thickening. Redemonstrated 1.6 x 0.3 cm right frontal scalp lipoma. Electronically Signed   By: Kellie Simmering DO   On: 07/23/2021 08:49    Procedures Procedures   Medications Ordered in ED Medications  0.9 %  sodium chloride infusion (has no administration in time range)    ED Course  I have reviewed the triage vital signs and the nursing notes.  Pertinent labs & imaging results that were available during my care of the patient were reviewed by me and considered in my medical decision making (see chart for details).    MDM Rules/Calculators/A&P                           Lorrin Mais presents with progressive fatigue and weakness.  He has stage IV renal cell carcinoma.  Vital signs were within normal limits.  He looked chronically ill and mildly malnourished.  Electrolytes were largely within normal limits except for a mild elevation in his serum calcium.  Renal function within normal limits.  White count slightly elevated and hemoglobin slightly low yet outside of the range for transfusion.  All symptoms are likely due to the side effects of his cancer and/or chemotherapy.  He has an appointment in 4 days with his oncologist.  He was comfortable with discharge home and felt symptomatically better after some IV fluid. Final Clinical Impression(s) / ED Diagnoses Final diagnoses:  Weakness  Renal cell carcinoma of right kidney metastatic to other site [facility])  Iron deficiency anemia, unspecified iron deficiency anemia type    Rx / DC Orders ED Discharge Orders     None        Arnaldo Natal, MD 07/23/21 1055

## 2021-07-23 NOTE — ED Notes (Signed)
Patient transported to CT 

## 2021-07-27 ENCOUNTER — Inpatient Hospital Stay: Payer: Medicare Other | Attending: Oncology

## 2021-07-27 ENCOUNTER — Inpatient Hospital Stay (HOSPITAL_BASED_OUTPATIENT_CLINIC_OR_DEPARTMENT_OTHER): Payer: Medicare Other | Admitting: Oncology

## 2021-07-27 ENCOUNTER — Other Ambulatory Visit: Payer: Self-pay

## 2021-07-27 ENCOUNTER — Encounter: Payer: Self-pay | Admitting: Nutrition

## 2021-07-27 VITALS — BP 125/85 | HR 86 | Temp 98.3°F | Resp 18 | Ht 66.0 in | Wt 146.1 lb

## 2021-07-27 DIAGNOSIS — C642 Malignant neoplasm of left kidney, except renal pelvis: Secondary | ICD-10-CM | POA: Insufficient documentation

## 2021-07-27 DIAGNOSIS — C649 Malignant neoplasm of unspecified kidney, except renal pelvis: Secondary | ICD-10-CM

## 2021-07-27 DIAGNOSIS — C7800 Secondary malignant neoplasm of unspecified lung: Secondary | ICD-10-CM | POA: Diagnosis present

## 2021-07-27 DIAGNOSIS — D649 Anemia, unspecified: Secondary | ICD-10-CM | POA: Diagnosis not present

## 2021-07-27 DIAGNOSIS — Z7984 Long term (current) use of oral hypoglycemic drugs: Secondary | ICD-10-CM | POA: Diagnosis not present

## 2021-07-27 DIAGNOSIS — D500 Iron deficiency anemia secondary to blood loss (chronic): Secondary | ICD-10-CM

## 2021-07-27 DIAGNOSIS — Z79899 Other long term (current) drug therapy: Secondary | ICD-10-CM | POA: Insufficient documentation

## 2021-07-27 DIAGNOSIS — I10 Essential (primary) hypertension: Secondary | ICD-10-CM | POA: Insufficient documentation

## 2021-07-27 DIAGNOSIS — C641 Malignant neoplasm of right kidney, except renal pelvis: Secondary | ICD-10-CM | POA: Diagnosis not present

## 2021-07-27 DIAGNOSIS — E032 Hypothyroidism due to medicaments and other exogenous substances: Secondary | ICD-10-CM

## 2021-07-27 DIAGNOSIS — C787 Secondary malignant neoplasm of liver and intrahepatic bile duct: Secondary | ICD-10-CM | POA: Diagnosis present

## 2021-07-27 DIAGNOSIS — Z7952 Long term (current) use of systemic steroids: Secondary | ICD-10-CM | POA: Insufficient documentation

## 2021-07-27 LAB — CMP (CANCER CENTER ONLY)
ALT: 22 U/L (ref 0–44)
AST: 19 U/L (ref 15–41)
Albumin: 1.8 g/dL — ABNORMAL LOW (ref 3.5–5.0)
Alkaline Phosphatase: 87 U/L (ref 38–126)
Anion gap: 12 (ref 5–15)
BUN: 10 mg/dL (ref 8–23)
CO2: 22 mmol/L (ref 22–32)
Calcium: 10.6 mg/dL — ABNORMAL HIGH (ref 8.9–10.3)
Chloride: 105 mmol/L (ref 98–111)
Creatinine: 0.79 mg/dL (ref 0.61–1.24)
GFR, Estimated: >60 mL/min (ref >60)
Glucose, Bld: 90 mg/dL (ref 70–99)
Potassium: 4.2 mmol/L (ref 3.5–5.1)
Sodium: 139 mmol/L (ref 135–145)
Total Bilirubin: <0.2 mg/dL — ABNORMAL LOW (ref 0.3–1.2)
Total Protein: 8.6 g/dL — ABNORMAL HIGH (ref 6.5–8.1)

## 2021-07-27 LAB — SAMPLE TO BLOOD BANK

## 2021-07-27 LAB — CBC WITH DIFFERENTIAL (CANCER CENTER ONLY)
Abs Immature Granulocytes: 0.04 10*3/uL (ref 0.00–0.07)
Basophils Absolute: 0.1 10*3/uL (ref 0.0–0.1)
Basophils Relative: 1 %
Eosinophils Absolute: 0.1 10*3/uL (ref 0.0–0.5)
Eosinophils Relative: 1 %
HCT: 28.3 % — ABNORMAL LOW (ref 39.0–52.0)
Hemoglobin: 9.2 g/dL — ABNORMAL LOW (ref 13.0–17.0)
Immature Granulocytes: 0 %
Lymphocytes Relative: 32 %
Lymphs Abs: 3.4 10*3/uL (ref 0.7–4.0)
MCH: 26.8 pg (ref 26.0–34.0)
MCHC: 32.5 g/dL (ref 30.0–36.0)
MCV: 82.5 fL (ref 80.0–100.0)
Monocytes Absolute: 0.9 10*3/uL (ref 0.1–1.0)
Monocytes Relative: 8 %
Neutro Abs: 6.3 10*3/uL (ref 1.7–7.7)
Neutrophils Relative %: 58 %
Platelet Count: 596 10*3/uL — ABNORMAL HIGH (ref 150–400)
RBC: 3.43 MIL/uL — ABNORMAL LOW (ref 4.22–5.81)
RDW: 19.3 % — ABNORMAL HIGH (ref 11.5–15.5)
WBC Count: 10.7 10*3/uL — ABNORMAL HIGH (ref 4.0–10.5)
nRBC: 0 % (ref 0.0–0.2)

## 2021-07-27 LAB — TSH: TSH: 2.118 u[IU]/mL (ref 0.320–4.118)

## 2021-07-27 NOTE — Progress Notes (Signed)
Hematology and Oncology Follow Up Visit  Chad Cantu [id number] [birth year] 70 y.o. 07/27/2021 10:33 AM Wynetta Emery [name], MDJohnson, [name], MD   Principle Diagnosis: 70 year old man with kidney cancer diagnosed in December 2021.  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   Prior Therapy:  He is status post ultrasound biopsy of the hepatic lesion on December 01, 2020.  The final pathology confirmed the presence of metastatic clear-cell renal cell carcinoma.  Ipilimumab 1 mg/kg and nivolumab 3 mg/kg therapy is started on December 23, 2019.  He completed 3 cycles of therapy with progression of disease.    Current therapy: Cabometyx 40 mg daily started on March 04, 2021.  Therapy switched to 20 mg daily starting in July 2022.  Interim History: Mr. Chad Cantu presents today for return evaluation.  Since the last visit, he was seen in the emergency department last week for fatigue and dizziness which has resolved at this time.  His work-up was unrevealing.  He had denies any chest pain or shortness of breath at this time.  He denies any appetite changes.  He has tolerated Cabometyx at the current dose much better currently.  He denies any nausea, vomiting or diarrhea.     Medications: Updated on review. Current Outpatient Medications  Medication Sig Dispense Refill   acetaminophen (TYLENOL) 325 MG tablet Take 2 tablets (650 mg total) by mouth every 6 (six) hours as needed for mild pain (or Fever >/= 101).     Ascorbic Acid (VITAMIN C PO) Take by mouth.     atorvastatin (LIPITOR) 20 MG tablet Take 1 tablet (20 mg total) by mouth daily. 90 tablet 3   atorvastatin (LIPITOR) 20 MG tablet TAKE 1 TABLET BY MOUTH DAILY. 90 tablet 2   cabozantinib (CABOMETYX) 20 MG tablet TAKE 1 TABLET DAILY. TAKE ON AN EMPTY STOMACH, 1 HOUR BEFORE OR 2 HOURS AFTER MEALS. 30 tablet 0   dexamethasone (DECADRON) 1 MG tablet TAKE 1 TABLET BY MOUTH AT 11PM, PRIOR TO THE 8AM  CORTISOL LAB DRAW 1 tablet 0   ferrous sulfate 325 (65 FE) MG EC tablet Take 1 tablet (325 mg total) by mouth in the morning and at bedtime. 60 tablet 3   ferrous sulfate 325 (65 FE) MG tablet TAKE 1 TABLET (325 MG TOTAL) BY MOUTH IN THE MORNING AND AT BEDTIME. 60 tablet 3   lisinopril-hydrochlorothiazide (ZESTORETIC) 10-12.5 MG tablet TAKE 1 TABLET BY MOUTH DAILY. 90 tablet 2   megestrol (MEGACE) 40 MG/ML suspension TAKE 10 MLS (400 MG TOTAL) BY MOUTH 2 (TWO) TIMES DAILY. 240 mL 0   megestrol (MEGACE) 40 MG/ML suspension TAKE 10ML BY MOUTH EVERY DAY 900 mL 1   metFORMIN (GLUCOPHAGE) 500 MG tablet TAKE 1 TABLET (500 MG TOTAL) BY MOUTH DAILY WITH BREAKFAST. 90 tablet 2   mirtazapine (REMERON) 7.5 MG tablet Take 1 tablet (7.5 mg total) by mouth at bedtime. 30 tablet 3   No current facility-administered medications for this visit.     Allergies: No Known Allergies   Physical Exam:    Blood pressure 125/85, pulse 86, temperature 98.3 F (36.8 C), temperature source Oral, resp. rate 18, height '5\' 6"'$  (1.676 m), weight 146 lb 1.6 oz (66.3 kg), SpO2 100 %.     ECOG: 1   General appearance: Comfortable appearing without any discomfort Head: Normocephalic without any trauma Oropharynx: Mucous membranes are moist and pink without any thrush or ulcers. Eyes: Pupils are equal and round reactive to light. Lymph  nodes: No cervical, supraclavicular, inguinal or axillary lymphadenopathy.   Heart:regular rate and rhythm.  S1 and S2 without leg edema. Lung: Clear without any rhonchi or wheezes.  No dullness to percussion. Abdomin: Soft, nontender, nondistended with good bowel sounds.  No hepatosplenomegaly. Musculoskeletal: No joint deformity or effusion.  Full range of motion noted. Neurological: No deficits noted on motor, sensory and deep tendon reflex exam. Skin: No petechial rash or dryness.  Appeared moist.           Lab Results: Lab Results  Component Value Date   WBC 10.7 (H)  07/27/2021   HGB 9.2 (L) 07/27/2021   HCT 28.3 (L) 07/27/2021   MCV 82.5 07/27/2021   PLT 596 (H) 07/27/2021     Chemistry      Component Value Date/Time   NA 139 07/27/2021 0912   NA 139 12/25/2020 1236   K 4.2 07/27/2021 0912   CL 105 07/27/2021 0912   CO2 22 07/27/2021 0912   BUN 10 07/27/2021 0912   BUN 11 12/25/2020 1236   CREATININE 0.79 07/27/2021 0912   CREATININE 0.80 01/16/2016 1052      Component Value Date/Time   CALCIUM 10.6 (H) 07/27/2021 0912   ALKPHOS 87 07/27/2021 0912   AST 19 07/27/2021 0912   ALT 22 07/27/2021 0912   BILITOT <0.2 (L) 07/27/2021 0912       IMPRESSION: 1. Interval decrease in size of the pleural based and parenchymal nodules of the right lung. No new suspicious pulmonary nodule or mass. 2. Scattered foci of arterial phase hyperenhancement in the liver parenchyma. These were not seen on the previous study performed without arterial phase imaging and are indeterminate. These could represent benign lesions such as vascular malformations or flash filling hemangiomas, but new metastatic disease cannot be entirely excluded. Consider close interval follow-up or MRI to further evaluate. 3. The 2 rim enhancing lesions highly suspicious for metastatic disease in the liver on the prior study both measure smaller today. 4. Similar appearance of the 6.4 cm heterogeneously enhancing mass in the lower pole left kidney with moderate left hydronephrosis secondary to medial tumor extension to involve the left UPJ. Decreased perfusion of the left kidney is consistent with obstructive uropathy. Similar appearance of abnormal soft tissue inferior to the left kidney tracking down along the anterior aspect of the left psoas muscle. No evidence for filling defect in the left renal vein. 5. Small left para-aortic lymph nodes are evident although no abdominal lymphadenopathy by CT size criteria. 6. Small anterior and inferior pericardial effusion,  mildly progressed in the interval. 7. Hepatic steatosis. 8. Aortic Atherosclerosis (ICD10-I70.0).     Impression and Plan:  70 year old man with:   1.  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    He is currently on Cabometyx which has tolerated a lower dose at a much better rate.  CT scan obtained on June 18, 2021 was personally reviewed and showed a positive response to therapy.  Risks and benefits of continuing this treatment were discussed.  Complications including worsening hypertension and GI toxicity were reiterated.  He is agreeable to continue at this time.   2.  Weight loss: I anticipate improvement after dose reduction Cabometyx.     3.  Hydronephrosis: Chronic in nature and unchanged.  Kidney function remains normal.     4.    Dermatological toxicity: Improved with dose reduction of Cabometyx.   5.  Goals of care and  prognosis: His disease is incurable although aggressive measures are warranted given his reasonable performance status.   6.  Anemia: Hemoglobin is stable and does not require any transfusion or intervention.  This is related to malignancy.  7.  Hypercalcemia: Corrected calcium remains slightly high.  We will continue to monitor on repeat Zometa as needed.   8.  Follow-up: he will return in 6 weeks for a follow-up visit.   30  minutes were spent on this visit.  Time was dedicated to reviewing laboratory data, disease status update, addressing complications related to cancer and cancer therapy.      Zola Button, MD 8/15/202210:33 AM

## 2021-07-27 NOTE — Progress Notes (Signed)
Provided one case of ensure plus.

## 2021-07-30 ENCOUNTER — Other Ambulatory Visit: Payer: Self-pay

## 2021-07-30 MED ORDER — ATORVASTATIN CALCIUM 20 MG PO TABS: ORAL_TABLET | Freq: Every day | ORAL | 2 refills | Status: DC

## 2021-08-21 ENCOUNTER — Encounter: Payer: Medicare Other | Admitting: Dietician

## 2021-08-21 ENCOUNTER — Telehealth: Payer: Self-pay | Admitting: Dietician

## 2021-08-21 NOTE — Telephone Encounter (Signed)
Nutrition Follow-up:  Patient with stage IV renal cancer metastatic to hepatic and pulmonary. He is taking daily Carbometyx.  Spoke with wife via telephone. She reports appetite is improving, patient has been less fatigued and preparing some meals for himself. Wife reports he continues to have night sweats, but these are less frequent. Patient is drinking Ensure supplement on most days as well water and Gatorade. Yesterday he had egg, cheese, home fries for breakfast, lasagna for lunch, home fries and shrimp for dinner.     Medications: reviewed  Labs: reviewed  Anthropometrics: Last weight 146 lb 1.6 oz on 8/15 - Per wife weights have increased, reports 150 lb on home scale last week  6/27 - 155 lb 6.4 oz  NUTRITION DIAGNOSIS: Inadequate oral intake stable   INTERVENTION: Continue eating small frequent meals and snacks that are high in calories and protein Continue drinking 1-2  Ensure Plus/equivalent daily - will mail coupons Encouraged activity as able Patient has contact information    MONITORING, EVALUATION, GOAL: weight trends, intake   NEXT VISIT: f/u via telephone ~8 weeks

## 2021-08-24 ENCOUNTER — Telehealth: Payer: Self-pay | Admitting: Oncology

## 2021-08-24 NOTE — Telephone Encounter (Signed)
Called patient regarding upcoming September appointments, patient will be notified.

## 2021-08-25 ENCOUNTER — Telehealth (INDEPENDENT_AMBULATORY_CARE_PROVIDER_SITE_OTHER): Payer: Medicare Other | Admitting: Nurse Practitioner

## 2021-08-25 ENCOUNTER — Encounter: Payer: Self-pay | Admitting: Nurse Practitioner

## 2021-08-25 ENCOUNTER — Other Ambulatory Visit: Payer: Self-pay

## 2021-08-25 DIAGNOSIS — U071 COVID-19: Secondary | ICD-10-CM | POA: Insufficient documentation

## 2021-08-25 MED ORDER — AZITHROMYCIN 250 MG PO TABS
ORAL_TABLET | ORAL | 0 refills | Status: AC
  Filled 2021-08-25: qty 6, 5d supply, fill #0

## 2021-08-25 NOTE — Patient Instructions (Addendum)
Covid 19 Sinusitis:   Stay well hydrated  Stay active  Deep breathing exercises  May take tylenol for fever or pain  May take mucinex twice daily     Follow up:  Follow up if needed

## 2021-08-25 NOTE — Progress Notes (Signed)
Virtual Visit via Telephone Note  I connected with Chad Cantu on 08/25/21 at  3:05 PM EDT by telephone and verified that I am speaking with the correct person using two identifiers.  Location: Patient: home Provider: office   I discussed the limitations, risks, security and privacy concerns of performing an evaluation and management service by telephone and the availability of in person appointments. I also discussed with the patient that there may be a patient responsible charge related to this service. The patient expressed understanding and agreed to proceed.   History of Present Illness:  Patient presents today for post-COVID care clinic visit.  Patient tested positive for COVID on 08/22/2021.  Symptoms started on 08/18/2021.  Patient states that he is having sinus pressure and congestion, headache, and slight cough. He has taken tylenol with minimal relief noted.  Patient denies any significant fever or shortness of breath.  Denies f/c/s, n/v/d, hemoptysis, PND, chest pain or edema.     Observations/Objective:  Vitals with BMI 07/27/2021 07/23/2021 07/23/2021  Height '5\' 6"'$  - -  Weight 146 lbs 2 oz - -  BMI [id number] - -  Systolic 0000000 [id number]  Diastolic 85 84 88  Pulse 86 77 69      Assessment and Plan:  Covid 19 Sinusitis:   Stay well hydrated  Stay active  Deep breathing exercises  May take tylenol for fever or pain  May take mucinex twice daily     Follow up:  Follow up if needed      I discussed the assessment and treatment plan with the patient. The patient was provided an opportunity to ask questions and all were answered. The patient agreed with the plan and demonstrated an understanding of the instructions.   The patient was advised to call back or seek an in-person evaluation if the symptoms worsen or if the condition fails to improve as anticipated.  I provided 23 minutes of non-face-to-face time during this encounter.   Fenton Foy, NP

## 2021-08-26 ENCOUNTER — Other Ambulatory Visit: Payer: Self-pay

## 2021-08-28 ENCOUNTER — Other Ambulatory Visit (HOSPITAL_COMMUNITY): Payer: Self-pay

## 2021-08-29 ENCOUNTER — Other Ambulatory Visit: Payer: Self-pay | Admitting: Oncology

## 2021-09-09 ENCOUNTER — Inpatient Hospital Stay (HOSPITAL_BASED_OUTPATIENT_CLINIC_OR_DEPARTMENT_OTHER): Payer: Medicare Other | Admitting: Oncology

## 2021-09-09 ENCOUNTER — Inpatient Hospital Stay: Payer: Medicare Other | Attending: Oncology

## 2021-09-09 ENCOUNTER — Inpatient Hospital Stay: Payer: Medicare Other

## 2021-09-09 ENCOUNTER — Other Ambulatory Visit: Payer: Self-pay

## 2021-09-09 VITALS — BP 152/87 | HR 119 | Temp 99.2°F | Resp 18 | Wt 153.1 lb

## 2021-09-09 DIAGNOSIS — C7800 Secondary malignant neoplasm of unspecified lung: Secondary | ICD-10-CM | POA: Diagnosis present

## 2021-09-09 DIAGNOSIS — E032 Hypothyroidism due to medicaments and other exogenous substances: Secondary | ICD-10-CM

## 2021-09-09 DIAGNOSIS — C787 Secondary malignant neoplasm of liver and intrahepatic bile duct: Secondary | ICD-10-CM | POA: Diagnosis present

## 2021-09-09 DIAGNOSIS — Z79899 Other long term (current) drug therapy: Secondary | ICD-10-CM | POA: Insufficient documentation

## 2021-09-09 DIAGNOSIS — C641 Malignant neoplasm of right kidney, except renal pelvis: Secondary | ICD-10-CM | POA: Diagnosis not present

## 2021-09-09 DIAGNOSIS — D649 Anemia, unspecified: Secondary | ICD-10-CM | POA: Diagnosis not present

## 2021-09-09 DIAGNOSIS — C642 Malignant neoplasm of left kidney, except renal pelvis: Secondary | ICD-10-CM | POA: Diagnosis not present

## 2021-09-09 DIAGNOSIS — C649 Malignant neoplasm of unspecified kidney, except renal pelvis: Secondary | ICD-10-CM

## 2021-09-09 DIAGNOSIS — D500 Iron deficiency anemia secondary to blood loss (chronic): Secondary | ICD-10-CM

## 2021-09-09 DIAGNOSIS — Z7984 Long term (current) use of oral hypoglycemic drugs: Secondary | ICD-10-CM | POA: Insufficient documentation

## 2021-09-09 LAB — CMP (CANCER CENTER ONLY)
ALT: 23 U/L (ref 0–44)
AST: 20 U/L (ref 15–41)
Albumin: 1.6 g/dL — ABNORMAL LOW (ref 3.5–5.0)
Alkaline Phosphatase: 76 U/L (ref 38–126)
Anion gap: 12 (ref 5–15)
BUN: 13 mg/dL (ref 8–23)
CO2: 20 mmol/L — ABNORMAL LOW (ref 22–32)
Calcium: 9.7 mg/dL (ref 8.9–10.3)
Chloride: 107 mmol/L (ref 98–111)
Creatinine: 0.79 mg/dL (ref 0.61–1.24)
GFR, Estimated: >60 mL/min (ref >60)
Glucose, Bld: 120 mg/dL — ABNORMAL HIGH (ref 70–99)
Potassium: 3.7 mmol/L (ref 3.5–5.1)
Sodium: 139 mmol/L (ref 135–145)
Total Bilirubin: <0.2 mg/dL — ABNORMAL LOW (ref 0.3–1.2)
Total Protein: 8.5 g/dL — ABNORMAL HIGH (ref 6.5–8.1)

## 2021-09-09 LAB — CBC WITH DIFFERENTIAL (CANCER CENTER ONLY)
Abs Immature Granulocytes: 0.1 10*3/uL — ABNORMAL HIGH (ref 0.00–0.07)
Basophils Absolute: 0.1 10*3/uL (ref 0.0–0.1)
Basophils Relative: 1 %
Eosinophils Absolute: 0.1 10*3/uL (ref 0.0–0.5)
Eosinophils Relative: 1 %
HCT: 25.8 % — ABNORMAL LOW (ref 39.0–52.0)
Hemoglobin: 8 g/dL — ABNORMAL LOW (ref 13.0–17.0)
Immature Granulocytes: 1 %
Lymphocytes Relative: 27 %
Lymphs Abs: 3.6 10*3/uL (ref 0.7–4.0)
MCH: 25 pg — ABNORMAL LOW (ref 26.0–34.0)
MCHC: 31 g/dL (ref 30.0–36.0)
MCV: 80.6 fL (ref 80.0–100.0)
Monocytes Absolute: 1.2 10*3/uL — ABNORMAL HIGH (ref 0.1–1.0)
Monocytes Relative: 9 %
Neutro Abs: 8.3 10*3/uL — ABNORMAL HIGH (ref 1.7–7.7)
Neutrophils Relative %: 61 %
Platelet Count: 647 10*3/uL — ABNORMAL HIGH (ref 150–400)
RBC: 3.2 MIL/uL — ABNORMAL LOW (ref 4.22–5.81)
RDW: 22.7 % — ABNORMAL HIGH (ref 11.5–15.5)
WBC Count: 13.3 10*3/uL — ABNORMAL HIGH (ref 4.0–10.5)
nRBC: 0.3 % — ABNORMAL HIGH (ref 0.0–0.2)

## 2021-09-09 LAB — TSH: TSH: 2.263 u[IU]/mL (ref 0.320–4.118)

## 2021-09-09 LAB — SAMPLE TO BLOOD BANK

## 2021-09-09 NOTE — Progress Notes (Signed)
Hematology and Oncology Follow Up Visit  Chad Cantu [id number] 1951-08-28 70 y.o. 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   Prior Therapy:  He is status post ultrasound biopsy of the hepatic lesion on December 01, 2020.  The final pathology confirmed the presence of metastatic clear-cell renal cell carcinoma.  Ipilimumab 1 mg/kg and nivolumab 3 mg/kg therapy is started on December 23, 2019.  He completed 3 cycles of therapy with progression of disease.    Current therapy: Cabometyx 40 mg daily started on March 04, 2021.  Therapy switched to 20 mg daily starting in July 2022.  Interim History: Chad Cantu returns today for a follow-up visit.  Since the last visit, he reports no major changes in his health.  He denies any recent hospitalizations or illnesses.  He has reported that the increased headaches in the last month or so did have a CT scan without contrast of the brain without any abnormalities.  He denies any nausea, vomiting or abdominal pain.  He has tolerated Cabometyx therapy current dose without any issues.     Medications: Unchanged on review. Current Outpatient Medications  Medication Sig Dispense Refill   atorvastatin (LIPITOR) 20 MG tablet TAKE 1 TABLET BY MOUTH DAILY. 90 tablet 2   acetaminophen (TYLENOL) 325 MG tablet Take 2 tablets (650 mg total) by mouth every 6 (six) hours as needed for mild pain (or Fever >/= 101).     Ascorbic Acid (VITAMIN C PO) Take by mouth.     CABOMETYX 20 MG tablet TAKE 1 TABLET DAILY. TAKE ON AN EMPTY STOMACH, 1 HOUR BEFORE OR 2 HOURS AFTER MEALS. 30 tablet 0   dexamethasone (DECADRON) 1 MG tablet TAKE 1 TABLET BY MOUTH AT 11PM, PRIOR TO THE 8AM CORTISOL LAB DRAW 1 tablet 0   ferrous sulfate 325 (65 FE) MG EC tablet Take 1 tablet (325 mg total) by mouth in the morning and at bedtime. 60  tablet 3   ferrous sulfate 325 (65 FE) MG tablet TAKE 1 TABLET (325 MG TOTAL) BY MOUTH IN THE MORNING AND AT BEDTIME. 60 tablet 3   lisinopril-hydrochlorothiazide (ZESTORETIC) 10-12.5 MG tablet TAKE 1 TABLET BY MOUTH DAILY. 90 tablet 2   megestrol (MEGACE) 40 MG/ML suspension TAKE 10 MLS (400 MG TOTAL) BY MOUTH 2 (TWO) TIMES DAILY. 240 mL 0   megestrol (MEGACE) 40 MG/ML suspension TAKE 10ML BY MOUTH EVERY DAY 900 mL 1   metFORMIN (GLUCOPHAGE) 500 MG tablet TAKE 1 TABLET (500 MG TOTAL) BY MOUTH DAILY WITH BREAKFAST. 90 tablet 2   mirtazapine (REMERON) 7.5 MG tablet Take 1 tablet (7.5 mg total) by mouth at bedtime. 30 tablet 3   No current facility-administered medications for this visit.     Allergies: No Known Allergies   Physical Exam:    Blood pressure (!) 152/87, pulse (!) 119, temperature 99.2 F (37.3 C), temperature source Oral, resp. rate 18, weight 153 lb 1.6 oz (69.4 kg), SpO2 100 %.      ECOG: 1    General appearance: Alert, awake without any distress. Head: Atraumatic without abnormalities Oropharynx: Without any thrush or ulcers. Eyes: No scleral icterus. Lymph nodes: No lymphadenopathy noted in the cervical, supraclavicular, or axillary nodes Heart:regular rate and rhythm, without any murmurs or gallops.   Lung: Clear to auscultation without any rhonchi, wheezes or dullness to percussion. Abdomin: Soft, nontender without any shifting  dullness or ascites. Musculoskeletal: No clubbing or cyanosis. Neurological: No motor or sensory deficits. Skin: No rashes or lesions.            Lab Results: Lab Results  Component Value Date   WBC 10.7 (H) 07/27/2021   HGB 9.2 (L) 07/27/2021   HCT 28.3 (L) 07/27/2021   MCV 82.5 07/27/2021   PLT 596 (H) 07/27/2021     Chemistry      Component Value Date/Time   NA 139 07/27/2021 0912   NA 139 12/25/2020 1236   K 4.2 07/27/2021 0912   CL 105 07/27/2021 0912   CO2 22 07/27/2021 0912   BUN 10 07/27/2021 0912    BUN 11 12/25/2020 1236   CREATININE 0.79 07/27/2021 0912   CREATININE 0.80 01/16/2016 1052      Component Value Date/Time   CALCIUM 10.6 (H) 07/27/2021 0912   ALKPHOS 87 07/27/2021 0912   AST 19 07/27/2021 0912   ALT 22 07/27/2021 0912   BILITOT <0.2 (L) 07/27/2021 0912          Impression and Plan:  70 year old man with:   1.  Kidney cancer diagnosed in December 2021.  He presented with stage IV clear-cell with pulmonary and hepatic involvement.  He is currently on Cabometyx with reasonable response to therapy based on imaging studies done in July 2022.  Risks and benefits of continuing this treatments were reviewed at this time.  Potential complications including GI toxicity and dermatological issues were reviewed.  Alternative treatment options includes a different targeted therapy such as axitinib among others.  He is agreeable to continue and we will update his staging scans before the next visit.   2.  Weight loss: Improved at this time and has gained weight.     3.  Hydronephrosis: His kidney function continues to be within normal range without any recent decline.  This is related to his malignancy.     4.    Dermatological toxicity: Resolved after reducing the dose of Cabometyx.   5.  Goals of care and prognosis: Therapy remains palliative though aggressive measures are warranted time.   6.  Anemia: Hemoglobin is stable and does not require any transfusion or intervention.  This is related to malignancy.  7.  Hypercalcemia: Risks and benefits of restarting Zometa were discussed at this time.  He is set calcium level remains under control and will do further Zometa option for the future.  8.  Headaches: Unclear etiology but given his advanced malignancy with update his CNS staging with MRI brain.   9.  Follow-up: In 6 weeks for repeat follow-up.   30  minutes were spent on this visit.  Time was dedicated to reviewing laboratory data, disease status update,  addressing complications related to cancer and cancer therapy.      Zola Button, MD 9/28/20221:19 PM

## 2021-09-21 ENCOUNTER — Other Ambulatory Visit: Payer: Self-pay

## 2021-09-21 ENCOUNTER — Ambulatory Visit (HOSPITAL_COMMUNITY)
Admission: RE | Admit: 2021-09-21 | Discharge: 2021-09-21 | Disposition: A | Discharge status: Home / Self Care | Payer: Medicare Other | Source: Ambulatory Visit | Attending: Oncology | Admitting: Oncology

## 2021-09-21 DIAGNOSIS — C641 Malignant neoplasm of right kidney, except renal pelvis: Secondary | ICD-10-CM | POA: Insufficient documentation

## 2021-09-21 MED ORDER — GADOBUTROL 1 MMOL/ML IV SOLN
7.0000 mL | Freq: Once | INTRAVENOUS | Status: AC | PRN
  Administered 2021-09-21: 7 mL via INTRAVENOUS

## 2021-09-22 ENCOUNTER — Telehealth: Payer: Self-pay | Admitting: *Deleted

## 2021-09-22 NOTE — Telephone Encounter (Signed)
-----   Message from Chad Portela, MD sent at 09/22/2021  9:14 AM EDT ----- Please let him know his MRI shows no cancer.

## 2021-09-22 NOTE — Telephone Encounter (Signed)
PC to patient, spoke with his wife [name] - informed her of MRI results, she verbalizes understanding.

## 2021-09-28 ENCOUNTER — Other Ambulatory Visit: Payer: Self-pay | Admitting: Internal Medicine

## 2021-09-28 DIAGNOSIS — R7303 Prediabetes: Secondary | ICD-10-CM

## 2021-09-28 MED ORDER — METFORMIN HCL 500 MG PO TABS
ORAL_TABLET | Freq: Every day | ORAL | 0 refills | Status: DC
Start: 2021-09-28 — End: 2021-11-23
  Filled 2021-09-28: qty 30, 30d supply, fill #0

## 2021-09-29 ENCOUNTER — Other Ambulatory Visit: Payer: Self-pay

## 2021-09-30 ENCOUNTER — Other Ambulatory Visit: Payer: Self-pay

## 2021-10-05 ENCOUNTER — Other Ambulatory Visit: Payer: Self-pay | Admitting: Oncology

## 2021-10-21 ENCOUNTER — Inpatient Hospital Stay: Payer: Medicare Other | Attending: Oncology

## 2021-10-21 DIAGNOSIS — C642 Malignant neoplasm of left kidney, except renal pelvis: Secondary | ICD-10-CM | POA: Insufficient documentation

## 2021-10-21 DIAGNOSIS — Z79899 Other long term (current) drug therapy: Secondary | ICD-10-CM | POA: Insufficient documentation

## 2021-10-21 DIAGNOSIS — C7800 Secondary malignant neoplasm of unspecified lung: Secondary | ICD-10-CM | POA: Insufficient documentation

## 2021-10-21 DIAGNOSIS — D630 Anemia in neoplastic disease: Secondary | ICD-10-CM | POA: Insufficient documentation

## 2021-10-21 DIAGNOSIS — Z7984 Long term (current) use of oral hypoglycemic drugs: Secondary | ICD-10-CM | POA: Insufficient documentation

## 2021-10-21 DIAGNOSIS — Z7952 Long term (current) use of systemic steroids: Secondary | ICD-10-CM | POA: Insufficient documentation

## 2021-10-21 DIAGNOSIS — C787 Secondary malignant neoplasm of liver and intrahepatic bile duct: Secondary | ICD-10-CM | POA: Insufficient documentation

## 2021-10-21 DIAGNOSIS — R519 Headache, unspecified: Secondary | ICD-10-CM | POA: Insufficient documentation

## 2021-10-27 ENCOUNTER — Other Ambulatory Visit: Payer: Self-pay

## 2021-10-27 ENCOUNTER — Encounter (HOSPITAL_COMMUNITY): Payer: Self-pay

## 2021-10-27 ENCOUNTER — Ambulatory Visit (HOSPITAL_COMMUNITY)
Admission: RE | Admit: 2021-10-27 | Discharge: 2021-10-27 | Disposition: A | Discharge status: Home / Self Care | Payer: Medicare Other | Source: Ambulatory Visit | Attending: Oncology | Admitting: Oncology

## 2021-10-27 ENCOUNTER — Ambulatory Visit (HOSPITAL_COMMUNITY): Payer: Medicare Other

## 2021-10-27 DIAGNOSIS — C641 Malignant neoplasm of right kidney, except renal pelvis: Secondary | ICD-10-CM

## 2021-10-27 LAB — POCT I-STAT CREATININE: Creatinine, Ser: 0.7 mg/dL (ref 0.61–1.24)

## 2021-10-27 MED ORDER — SODIUM CHLORIDE (PF) 0.9 % IJ SOLN
INTRAMUSCULAR | Status: AC
  Filled 2021-10-27: qty 50

## 2021-10-27 MED ORDER — IOHEXOL 350 MG/ML SOLN
75.0000 mL | Freq: Once | INTRAVENOUS | Status: AC | PRN
  Administered 2021-10-27: 75 mL via INTRAVENOUS

## 2021-10-28 ENCOUNTER — Inpatient Hospital Stay (HOSPITAL_BASED_OUTPATIENT_CLINIC_OR_DEPARTMENT_OTHER): Payer: Medicare Other | Admitting: Oncology

## 2021-10-28 VITALS — BP 133/91 | HR 99 | Temp 99.7°F | Resp 16 | Ht 66.0 in | Wt 154.8 lb

## 2021-10-28 DIAGNOSIS — D630 Anemia in neoplastic disease: Secondary | ICD-10-CM | POA: Diagnosis not present

## 2021-10-28 DIAGNOSIS — C7800 Secondary malignant neoplasm of unspecified lung: Secondary | ICD-10-CM | POA: Diagnosis present

## 2021-10-28 DIAGNOSIS — C641 Malignant neoplasm of right kidney, except renal pelvis: Secondary | ICD-10-CM

## 2021-10-28 DIAGNOSIS — Z79899 Other long term (current) drug therapy: Secondary | ICD-10-CM | POA: Diagnosis not present

## 2021-10-28 DIAGNOSIS — C787 Secondary malignant neoplasm of liver and intrahepatic bile duct: Secondary | ICD-10-CM | POA: Diagnosis present

## 2021-10-28 DIAGNOSIS — Z7984 Long term (current) use of oral hypoglycemic drugs: Secondary | ICD-10-CM | POA: Diagnosis not present

## 2021-10-28 DIAGNOSIS — Z7952 Long term (current) use of systemic steroids: Secondary | ICD-10-CM | POA: Diagnosis not present

## 2021-10-28 DIAGNOSIS — C642 Malignant neoplasm of left kidney, except renal pelvis: Secondary | ICD-10-CM | POA: Diagnosis present

## 2021-10-28 DIAGNOSIS — R519 Headache, unspecified: Secondary | ICD-10-CM | POA: Diagnosis not present

## 2021-10-28 NOTE — Progress Notes (Signed)
Hematology and Oncology Follow Up Visit  Chad Cantu [id number] Sep 01, 1951 70 y.o. 10/28/2021 1:17 PM Chad Cantu, MDJohnson, Chad Batman, MD   Principle Diagnosis: 70 year old man with kidney cancer diagnosed in December 2021.  He had presented with stage IV clear-cell with hepatic and pulmonary involvement.   Prior Therapy:  He is status post ultrasound biopsy of the hepatic lesion on December 01, 2020.  The final pathology confirmed the presence of metastatic clear-cell renal cell carcinoma.  Ipilimumab 1 mg/kg and nivolumab 3 mg/kg therapy is started on December 23, 2019.  He completed 3 cycles of therapy with progression of disease.    Current therapy: Cabometyx 40 mg daily started on March 04, 2021.  Therapy switched to 20 mg daily starting in July 2022.  Interim History: Chad Cantu is here for repeat evaluation.  Since her last visit, he reports no major changes in his health.  He denies any recent hospitalizations or illnesses.  He denies any bone pain or pathological fractures.  He denies any weight loss or appetite changes.  He continues to tolerate Cabometyx at the current dose without any issues.  He denies any diarrhea or neurological deficits.     Medications: Reviewed without changes. Current Outpatient Medications  Medication Sig Dispense Refill   atorvastatin (LIPITOR) 20 MG tablet TAKE 1 TABLET BY MOUTH DAILY. 90 tablet 2   acetaminophen (TYLENOL) 325 MG tablet Take 2 tablets (650 mg total) by mouth every 6 (six) hours as needed for mild pain (or Fever >/= 101).     Ascorbic Acid (VITAMIN C PO) Take by mouth.     CABOMETYX 20 MG tablet TAKE 1 TABLET DAILY. TAKE ON AN EMPTY STOMACH, 1 HOUR BEFORE OR 2 HOURS AFTER MEALS. 30 tablet 0   dexamethasone (DECADRON) 1 MG tablet TAKE 1 TABLET BY MOUTH AT 11PM, PRIOR TO THE 8AM CORTISOL LAB DRAW 1 tablet 0   ferrous sulfate 325 (65 FE) MG EC tablet Take 1 tablet (325 mg total) by mouth in the morning and at bedtime. 60  tablet 3   ferrous sulfate 325 (65 FE) MG tablet TAKE 1 TABLET (325 MG TOTAL) BY MOUTH IN THE MORNING AND AT BEDTIME. 60 tablet 3   lisinopril-hydrochlorothiazide (ZESTORETIC) 10-12.5 MG tablet TAKE 1 TABLET BY MOUTH DAILY. 90 tablet 2   megestrol (MEGACE) 40 MG/ML suspension TAKE 10 MLS (400 MG TOTAL) BY MOUTH 2 (TWO) TIMES DAILY. 240 mL 0   megestrol (MEGACE) 40 MG/ML suspension TAKE 10ML BY MOUTH EVERY DAY 900 mL 1   metFORMIN (GLUCOPHAGE) 500 MG tablet TAKE 1 TABLET (500 MG TOTAL) BY MOUTH DAILY WITH BREAKFAST. 30 tablet 0   mirtazapine (REMERON) 7.5 MG tablet Take 1 tablet (7.5 mg total) by mouth at bedtime. 30 tablet 3   No current facility-administered medications for this visit.     Allergies: No Known Allergies   Physical Exam:     Blood pressure (!) 133/91, pulse 99, temperature 99.7 F (37.6 C), temperature source Temporal, resp. rate 16, height 5\' 6"  (1.676 m), weight 154 lb 12.8 oz (70.2 kg), SpO2 100 %.      ECOG: 1   General appearance: Comfortable appearing without any discomfort Head: Normocephalic without any trauma Oropharynx: Mucous membranes are moist and pink without any thrush or ulcers. Eyes: Pupils are equal and round reactive to light. Lymph nodes: No cervical, supraclavicular, inguinal or axillary lymphadenopathy.   Heart:regular rate and rhythm.  S1 and S2 without leg edema. Lung: Clear without any  rhonchi or wheezes.  No dullness to percussion. Abdomin: Soft, nontender, nondistended with good bowel sounds.  No hepatosplenomegaly. Musculoskeletal: No joint deformity or effusion.  Full range of motion noted. Neurological: No deficits noted on motor, sensory and deep tendon reflex exam. Skin: No petechial rash or dryness.  Appeared moist.             Lab Results: Lab Results  Component Value Date   WBC 13.3 (H) 09/09/2021   HGB 8.0 (L) 09/09/2021   HCT 25.8 (L) 09/09/2021   MCV 80.6 09/09/2021   PLT 647 (H) 09/09/2021      Chemistry      Component Value Date/Time   NA 139 09/09/2021 1322   NA 139 12/25/2020 1236   K 3.7 09/09/2021 1322   CL 107 09/09/2021 1322   CO2 20 (L) 09/09/2021 1322   BUN 13 09/09/2021 1322   BUN 11 12/25/2020 1236   CREATININE 0.70 10/27/2021 1328   CREATININE 0.79 09/09/2021 1322   CREATININE 0.80 01/16/2016 1052      Component Value Date/Time   CALCIUM 9.7 09/09/2021 1322   ALKPHOS 76 09/09/2021 1322   AST 20 09/09/2021 1322   ALT 23 09/09/2021 1322   BILITOT <0.2 (L) 09/09/2021 1322       IMPRESSION: 1. 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 2. Stable small left para-aortic lymph nodes, concerning for metastatic disease. 3. Multiple foci of arterial phase hyperenhancement in the liver parenchyma are stable in the interval, nonspecific. While the 2 rim enhancing lesions identified previously are stable, there are new hypoattenuating ill-defined lesions within the liver parenchyma raising concern for new metastatic involvement. Abdominal MRI with and without contrast could be used to further evaluate as clinically warranted. 4. Similar appearance of the subpleural and parenchymal nodules in the right lung, consistent with metastatic disease. 5. Trace bilateral pleural effusions. 6. Stable common bile duct dilatation. 7. Aortic Atherosclerosis (ICD10-I70.0).   Impression and Plan:  70 year old man with:   1.  Stage IV  clear-cell renal cell carcinoma with pulmonary and hepatic involvement.  His disease status was reviewed today and treatment choices were discussed.  CT scan obtained on 10/27/2021 was personally reviewed and showed no evidence of progression of disease.  Risks and benefits of continuing his current treatment were reviewed.  Complications including weight loss, diet decline, diarrhea and other GI complications were  reviewed.  Other options such as axitinib among others were also reviewed.  At this time I recommended continuing the same dose and schedule.   2.  Weight loss:      3.  Hydronephrosis: Remains stable at this time with kidney function is at baseline normal level.     4.    Dermatological toxicity: No recent rashes or lesions noted at this time.   5.  Goals of care and prognosis: His disease is incurable although aggressive measures are warranted.   6.  Anemia: Related to malignancy and chronic disease.  We will continue to monitor and transfuse as needed.  7.  Hypercalcemia: Related to malignancy and has been on Zometa as needed.  We will continue to monitor and repeat if needed in the future.  8.  Headaches: MRI of the brain on October 20 10th showed no evidence of malignancy.   9.  Follow-up: In 2 months for repeat follow-up.   30  minutes were  dedicated to this encounter.  The time was spent on reviewing laboratory data, disease status update, reviewing imaging studies and treatment choices in the future.      Zola Button, MD 11/16/20221:17 PM

## 2021-11-01 ENCOUNTER — Other Ambulatory Visit: Payer: Self-pay | Admitting: Oncology

## 2021-11-02 ENCOUNTER — Encounter: Payer: Self-pay | Admitting: Hematology

## 2021-11-20 ENCOUNTER — Telehealth: Payer: Self-pay | Admitting: Internal Medicine

## 2021-11-20 ENCOUNTER — Other Ambulatory Visit: Payer: Self-pay

## 2021-11-20 DIAGNOSIS — R7303 Prediabetes: Secondary | ICD-10-CM

## 2021-11-20 NOTE — Telephone Encounter (Signed)
Requested medication (s) are due for refill today: Yes  Requested medication (s) are on the active medication list: yes  Last refill:  09/28/21 #30/0RF  Future visit scheduled: No  Notes to clinic:  Unable to refill per protocol due to failed labs, no updated results. Unable to refill per protocol, appointment needed.      Requested Prescriptions  Pending Prescriptions Disp Refills   metFORMIN (GLUCOPHAGE) 500 MG tablet 30 tablet 0    Sig: TAKE 1 TABLET (500 MG TOTAL) BY MOUTH DAILY WITH BREAKFAST.     Endocrinology:  Diabetes - Biguanides Failed - 11/20/2021  3:49 PM      Failed - HBA1C is between 0 and 7.9 and within 180 days    HbA1c POC (<> result, manual entry)  Date Value Ref Range Status  09/26/2018 5.6 4.0 - 5.6 % Final   Hgb A1c MFr Bld  Date Value Ref Range Status  12/09/2020 6.1 (H) 4.8 - 5.6 % Final    Comment:    (NOTE) Pre diabetes:          5.7%-6.4%  Diabetes:              >6.4%  Glycemic control for   <7.0% adults with diabetes           Failed - Valid encounter within last 6 months    Recent Outpatient Visits           8 months ago Hospital discharge follow-up   [place], MD   11 months ago Hospital discharge follow-up   [place], Deborah B, MD   1 year ago Alcohol use disorder, severe, dependence [facility])   [place], Deborah B, MD   1 year ago Essential hypertension   [place], Deborah B, MD   2 years ago [place], Levada Dy M, [place]              Passed - Cr in normal range and within 360 days    Creatinine  Date Value Ref Range Status  09/09/2021 0.79 0.61 - 1.24 mg/dL Final   Creat  Date Value Ref Range Status  01/16/2016 0.80 0.70 - 1.25 mg/dL Final   Creatinine, Ser  Date Value Ref Range Status  10/27/2021  0.70 0.61 - 1.24 mg/dL Final          Passed - AA eGFR in normal range and within 360 days    GFR, Est African American  Date Value Ref Range Status  04/08/2015 >89 mL/min Final   GFR calc Af Amer  Date Value Ref Range Status  12/25/2020 107 >59 mL/min/1.73 Final    Comment:    **In accordance with recommendations from the NKF-ASN Task force,**   [facility] is in the process of updating its eGFR calculation to the   2021 CKD-EPI creatinine equation that estimates kidney function   without a race variable.    GFR, Est Non African American  Date Value Ref Range Status  04/08/2015 >89 mL/min Final    Comment:      The estimated GFR is a calculation valid for adults (>=51 years old) that uses the CKD-EPI algorithm to adjust for age and sex. It is   not to be used for children, pregnant women, hospitalized patients,    patients on dialysis, or with rapidly changing  kidney function. According to the NKDEP, eGFR >89 is normal, 60-89 shows mild impairment, 30-59 shows moderate impairment, 15-29 shows severe impairment and <15 is ESRD.      GFR, Estimated  Date Value Ref Range Status  09/09/2021 >60 >60 mL/min Final    Comment:    (NOTE) Calculated using the CKD-EPI Creatinine Equation (2021)

## 2021-11-23 ENCOUNTER — Other Ambulatory Visit: Payer: Self-pay

## 2021-11-23 MED ORDER — METFORMIN HCL 500 MG PO TABS
ORAL_TABLET | Freq: Every day | ORAL | 1 refills | Status: DC
  Filled 2021-11-23 – 2021-12-17 (×2): qty 30, 30d supply, fill #0
  Filled 2021-12-17: qty 30, 30d supply, fill #1

## 2021-11-23 NOTE — Addendum Note (Signed)
Addended by: Daisy Blossom, Annie Main L on: 11/23/2021 04:46 PM   Modules accepted: Orders

## 2021-11-23 NOTE — Telephone Encounter (Signed)
Rx sent 

## 2021-11-23 NOTE — Telephone Encounter (Signed)
Pt has made appt for first available with his dr, 01/25/2022. Can you send in enough to get him through until then?

## 2021-11-24 ENCOUNTER — Other Ambulatory Visit: Payer: Self-pay

## 2021-11-29 ENCOUNTER — Other Ambulatory Visit: Payer: Self-pay | Admitting: Oncology

## 2021-11-30 ENCOUNTER — Encounter: Payer: Self-pay | Admitting: Hematology

## 2021-12-04 ENCOUNTER — Encounter (HOSPITAL_COMMUNITY): Payer: Self-pay

## 2021-12-04 ENCOUNTER — Emergency Department (HOSPITAL_COMMUNITY)
Admission: EM | Admit: 2021-12-04 | Discharge: 2021-12-04 | Disposition: A | Discharge status: Home / Self Care | Payer: Medicare Other | Attending: Emergency Medicine | Admitting: Emergency Medicine

## 2021-12-04 DIAGNOSIS — R42 Dizziness and giddiness: Secondary | ICD-10-CM

## 2021-12-04 DIAGNOSIS — Z87891 Personal history of nicotine dependence: Secondary | ICD-10-CM | POA: Insufficient documentation

## 2021-12-04 DIAGNOSIS — E119 Type 2 diabetes mellitus without complications: Secondary | ICD-10-CM | POA: Diagnosis not present

## 2021-12-04 DIAGNOSIS — I10 Essential (primary) hypertension: Secondary | ICD-10-CM | POA: Diagnosis not present

## 2021-12-04 DIAGNOSIS — G90A Postural orthostatic tachycardia syndrome (POTS): Secondary | ICD-10-CM | POA: Insufficient documentation

## 2021-12-04 DIAGNOSIS — Z79899 Other long term (current) drug therapy: Secondary | ICD-10-CM | POA: Insufficient documentation

## 2021-12-04 DIAGNOSIS — Z8616 Personal history of COVID-19: Secondary | ICD-10-CM | POA: Diagnosis not present

## 2021-12-04 DIAGNOSIS — Z7984 Long term (current) use of oral hypoglycemic drugs: Secondary | ICD-10-CM | POA: Diagnosis not present

## 2021-12-04 LAB — CBC
HCT: 29.9 % — ABNORMAL LOW (ref 39.0–52.0)
Hemoglobin: 8.8 g/dL — ABNORMAL LOW (ref 13.0–17.0)
MCH: 26.1 pg (ref 26.0–34.0)
MCHC: 29.4 g/dL — ABNORMAL LOW (ref 30.0–36.0)
MCV: 88.7 fL (ref 80.0–100.0)
Platelets: 552 10*3/uL — ABNORMAL HIGH (ref 150–400)
RBC: 3.37 MIL/uL — ABNORMAL LOW (ref 4.22–5.81)
RDW: 18.8 % — ABNORMAL HIGH (ref 11.5–15.5)
WBC: 9.3 10*3/uL (ref 4.0–10.5)
nRBC: 0 % (ref 0.0–0.2)

## 2021-12-04 LAB — BASIC METABOLIC PANEL
Anion gap: 9 (ref 5–15)
BUN: 6 mg/dL — ABNORMAL LOW (ref 8–23)
CO2: 23 mmol/L (ref 22–32)
Calcium: 9 mg/dL (ref 8.9–10.3)
Chloride: 103 mmol/L (ref 98–111)
Creatinine, Ser: 0.76 mg/dL (ref 0.61–1.24)
GFR, Estimated: >60 mL/min (ref >60)
Glucose, Bld: 173 mg/dL — ABNORMAL HIGH (ref 70–99)
Potassium: 3.7 mmol/L (ref 3.5–5.1)
Sodium: 135 mmol/L (ref 135–145)

## 2021-12-04 LAB — URINALYSIS, ROUTINE W REFLEX MICROSCOPIC
Bilirubin Urine: NEGATIVE
Glucose, UA: NEGATIVE mg/dL
Hgb urine dipstick: NEGATIVE
Ketones, ur: NEGATIVE mg/dL
Leukocytes,Ua: NEGATIVE
Nitrite: NEGATIVE
Protein, ur: 30 mg/dL — AB
Specific Gravity, Urine: 1.01 (ref 1.005–1.030)
pH: 6 (ref 5.0–8.0)

## 2021-12-04 MED ORDER — SODIUM CHLORIDE 0.9 % IV BOLUS
1000.0000 mL | Freq: Once | INTRAVENOUS | Status: AC
  Administered 2021-12-04: 11:00:00 1000 mL via INTRAVENOUS

## 2021-12-04 NOTE — ED Provider Notes (Signed)
[facility] DEPT Provider Note   CSN: [id number] Arrival date & time: 12/04/21  0941     History Chief Complaint  Patient presents with   Dizziness   Diarrhea    Chad Cantu is a 70 y.o. male.  HPI     70 year old male comes in with chief complaint of dizziness.  Patient reports that he has history of renal cancer and currently on oral chemotherapy.  He has been having diarrhea for the last several months.  Symptoms are usually intermittent.  Current episode of diarrhea is described as 2 or 3 loose BM a day that are yellow in color for the last 2 weeks or so.  More recently he has been having dizziness.  Dizziness is present when he stands up.  He denies any associated visual deficits, headaches, neck pain, focal weakness or numbness.  He denies any blood loss.  Review of system is negative for chest pain, shortness of breath.  Past Medical History:  Diagnosis Date   Cancer ([place])    Diabetes mellitus without complication ([place])    ETOH abuse    Hyperlipidemia    Hypertension    Substance abuse ([place])    ETOH, cocaine.  Remission    Patient Active Problem List   Diagnosis Date Noted   COVID-19 08/25/2021   Symptomatic anemia 02/18/2021   History of recent fall 12/25/2020   Hypercalcemia 12/22/2020   Metastatic renal cell carcinoma ([place]) 12/09/2020   Goals of care, counseling/discussion 12/09/2020   AKI (acute kidney injury) ([place]) 12/09/2020   Leukocytosis 12/09/2020   Alcohol dependence with uncomplicated withdrawal ([place]) 11/16/2020   Near syncope 09/20/2020   Dehydration 09/20/2020   Alcohol abuse 09/20/2020   Alcohol use disorder, severe, dependence ([place])    Memory difficulty 05/02/2017   Rash, skin 05/02/2017   Overactive bladder 05/02/2017   Alcohol use disorder 05/01/2017   HLD (hyperlipidemia) 04/09/2015   Essential hypertension 04/08/2015   Tobacco abuse 04/08/2015    Past Surgical History:  Procedure Laterality Date    COLONOSCOPY  5 years ago?   at [facility] maybe per pt,?unsure results   LAPAROSCOPIC GASTROTOMY W/ REPAIR OF ULCER         Family History  Problem Relation Age of Onset   Diabetes Mother    Colon cancer Neg Hx    Colon polyps Neg Hx    Esophageal cancer Neg Hx    Rectal cancer Neg Hx    Stomach cancer Neg Hx     Social History   Tobacco Use   Smoking status: Former    Years: 55.00    Types: Cigarettes   Smokeless tobacco: Never  Vaping Use   Vaping Use: Never used  Substance Use Topics   Alcohol use: Not Currently    Alcohol/week: 12.0 standard drinks    Types: 12 Cans of beer per week   Drug use: Not Currently    Types: Marijuana    Comment: marijuana maybe 2 months ago, remote h/o cocaine use    Home Medications Prior to Admission medications   Medication Sig Start Date End Date Taking? Authorizing Provider  atorvastatin (LIPITOR) 20 MG tablet TAKE 1 TABLET BY MOUTH DAILY. 07/30/21 07/30/22  [name], MD  acetaminophen (TYLENOL) 325 MG tablet Take 2 tablets (650 mg total) by mouth every 6 (six) hours as needed for mild pain (or Fever >/= 101). 11/22/20   Domenic Polite, MD  Ascorbic Acid (VITAMIN C PO) Take by mouth.  [provider]  CABOMETYX 20 MG tablet TAKE 1 TABLET DAILY. TAKE ON AN EMPTY STOMACH, 1 HOUR BEFORE OR 2 HOURS AFTER MEALS. 11/30/21   [name], MD  dexamethasone (DECADRON) 1 MG tablet TAKE 1 TABLET BY MOUTH AT 11PM, PRIOR TO THE 8AM CORTISOL LAB DRAW 12/17/20 12/17/21  Janith Lima, MD  ferrous sulfate 325 (65 FE) MG EC tablet Take 1 tablet (325 mg total) by mouth in the morning and at bedtime. 02/02/21   [name], MD  ferrous sulfate 325 (65 FE) MG tablet TAKE 1 TABLET (325 MG TOTAL) BY MOUTH IN THE MORNING AND AT BEDTIME. 02/02/21 02/02/22  [name], MD  lisinopril-hydrochlorothiazide (ZESTORETIC) 10-12.5 MG tablet TAKE 1 TABLET BY MOUTH DAILY. 06/05/20 06/05/21  [name], MD  megestrol (MEGACE) 40 MG/ML  suspension TAKE 10 MLS (400 MG TOTAL) BY MOUTH 2 (TWO) TIMES DAILY. 01/13/21 01/13/22  [name], MD  megestrol (MEGACE) 40 MG/ML suspension TAKE 10ML BY MOUTH EVERY DAY 04/08/21   [name], MD  metFORMIN (GLUCOPHAGE) 500 MG tablet TAKE 1 TABLET (500 MG TOTAL) BY MOUTH DAILY WITH BREAKFAST. 11/23/21 11/23/22  [name], MD  mirtazapine (REMERON) 7.5 MG tablet Take 1 tablet (7.5 mg total) by mouth at bedtime. 04/08/21   [name], MD  lisinopril (ZESTRIL) 20 MG tablet Take 20 mg by mouth daily. 03/26/20 09/20/20  [provider]  prochlorperazine (COMPAZINE) 10 MG tablet Take 1 tablet (10 mg total) by mouth every 6 (six) hours as needed for nausea or vomiting. Patient not taking: Reported on 02/18/2021 12/09/20 02/18/21  [name], MD    Allergies    Patient has no known allergies.  Review of Systems   Review of Systems  Constitutional:  Positive for activity change.  Respiratory:  Negative for shortness of breath.   Cardiovascular:  Negative for chest pain.  Neurological:  Positive for dizziness.  All other systems reviewed and are negative.  Physical Exam Updated Vital Signs BP (!) 145/92    Pulse 74    Temp 98.3 F (36.8 C) (Oral)    Resp 19    SpO2 100%   Physical Exam Vitals and nursing note reviewed.  Constitutional:      Appearance: He is well-developed.  HENT:     Head: Atraumatic.  Eyes:     Extraocular Movements: Extraocular movements intact.     Pupils: Pupils are equal, round, and reactive to light.     Comments: No nystagmus  Neck:     Comments: No meningismus Cardiovascular:     Rate and Rhythm: Normal rate.  Pulmonary:     Effort: Pulmonary effort is normal.  Musculoskeletal:     Cervical back: Neck supple.     Right lower leg: No edema.     Left lower leg: No edema.  Skin:    General: Skin is warm.  Neurological:     Mental Status: He is alert and oriented to person, place, and time.     Cranial Nerves: No cranial nerve  deficit.     Sensory: No sensory deficit.     Motor: No weakness.     Coordination: Coordination normal.    ED Results / Procedures / Treatments   Labs (all labs ordered are listed, but only abnormal results are displayed) Labs Reviewed  BASIC METABOLIC PANEL - Abnormal; Notable for the following components:      Result Value   Glucose, Bld 173 (*)  BUN 6 (*)    All other components within normal limits  CBC - Abnormal; Notable for the following components:   RBC 3.37 (*)    Hemoglobin 8.8 (*)    HCT 29.9 (*)    MCHC 29.4 (*)    RDW 18.8 (*)    Platelets 552 (*)    All other components within normal limits  URINALYSIS, ROUTINE W REFLEX MICROSCOPIC - Abnormal; Notable for the following components:   Protein, ur 30 (*)    Bacteria, UA RARE (*)    All other components within normal limits    EKG EKG Interpretation  Date/Time:  Friday December 04 2021 09:51:21 EST Ventricular Rate:  104 PR Interval:  137 QRS Duration: 90 QT Interval:  339 QTC Calculation: 446 R Axis:   38 Text Interpretation: Sinus tachycardia Abnormal R-wave progression, early transition No acute changes No significant change since last tracing Confirmed by Varney Biles [phone]) on 12/04/2021 9:59:57 AM  Radiology No results found.  Procedures Procedures   Medications Ordered in ED Medications  sodium chloride 0.9 % bolus 1,000 mL (0 mLs Intravenous Stopped 12/04/21 1246)    ED Course  I have reviewed the triage vital signs and the nursing notes.  Pertinent labs & imaging results that were available during my care of the patient were reviewed by me and considered in my medical decision making (see chart for details).    MDM Rules/Calculators/A&P                          70 year old male with history of renal cell carcinoma comes in with chief complaint of dizziness and diarrhea.  Diarrhea appears to be a chronic issue, intermittently flared up.  Dizziness is more of a subacute issue.  He  has a steady gait and the neuro exam is nonfocal.  No nystagmus, no dysmetria.  The dizziness is positional, but it does not appear that his dehydrated to [place] right now  Given that the symptoms are postural and provoked every time he stands up, we do think possibly a POTS type situation.  No new BP medications.  No headaches, neck pain so we do not think any CT imaging is indicated at this time.  Results discussed with the patient and his wife.  They are comfortable going home at this time.  Strict ER return precautions also discussed.     Final Clinical Impression(s) / ED Diagnoses Final diagnoses:  Dizziness  POTS (postural orthostatic tachycardia syndrome)    Rx / DC Orders ED Discharge Orders     None        Varney Biles, MD 12/04/21 1339

## 2021-12-04 NOTE — ED Triage Notes (Signed)
Pt arrives via ems. Pt reports dizziness and diarrhea x 3 weeks. Denies nausea and vomiting.

## 2021-12-04 NOTE — ED Notes (Signed)
Pt was able to ambulate with a steady gate. Pt stated that he started to feel nauseas while walking.

## 2021-12-04 NOTE — Discharge Instructions (Signed)
It is unclear to [place] why you are feeling dizzy, however the ER work-up is reassuring.  Please return to the ER if your symptoms get worse as instructed above on follow-up recommendations.

## 2021-12-05 ENCOUNTER — Other Ambulatory Visit: Payer: Self-pay | Admitting: Oncology

## 2021-12-08 ENCOUNTER — Encounter: Payer: Self-pay | Admitting: Hematology

## 2021-12-18 ENCOUNTER — Other Ambulatory Visit: Payer: Self-pay

## 2021-12-19 ENCOUNTER — Other Ambulatory Visit: Payer: Self-pay

## 2021-12-22 ENCOUNTER — Other Ambulatory Visit: Payer: Self-pay

## 2021-12-22 ENCOUNTER — Other Ambulatory Visit: Payer: Self-pay | Admitting: Oncology

## 2021-12-30 ENCOUNTER — Other Ambulatory Visit: Payer: Self-pay

## 2021-12-30 ENCOUNTER — Inpatient Hospital Stay: Payer: Medicare Other | Attending: Oncology

## 2021-12-30 ENCOUNTER — Inpatient Hospital Stay (HOSPITAL_BASED_OUTPATIENT_CLINIC_OR_DEPARTMENT_OTHER): Payer: Medicare Other | Admitting: Oncology

## 2021-12-30 VITALS — BP 132/76 | HR 119 | Temp 97.7°F | Resp 17 | Ht 66.0 in | Wt 153.7 lb

## 2021-12-30 DIAGNOSIS — E032 Hypothyroidism due to medicaments and other exogenous substances: Secondary | ICD-10-CM

## 2021-12-30 DIAGNOSIS — C7800 Secondary malignant neoplasm of unspecified lung: Secondary | ICD-10-CM | POA: Diagnosis present

## 2021-12-30 DIAGNOSIS — C641 Malignant neoplasm of right kidney, except renal pelvis: Secondary | ICD-10-CM | POA: Diagnosis not present

## 2021-12-30 DIAGNOSIS — Z79899 Other long term (current) drug therapy: Secondary | ICD-10-CM | POA: Diagnosis not present

## 2021-12-30 DIAGNOSIS — D649 Anemia, unspecified: Secondary | ICD-10-CM | POA: Insufficient documentation

## 2021-12-30 DIAGNOSIS — C787 Secondary malignant neoplasm of liver and intrahepatic bile duct: Secondary | ICD-10-CM | POA: Insufficient documentation

## 2021-12-30 DIAGNOSIS — N1330 Unspecified hydronephrosis: Secondary | ICD-10-CM | POA: Insufficient documentation

## 2021-12-30 DIAGNOSIS — C642 Malignant neoplasm of left kidney, except renal pelvis: Secondary | ICD-10-CM | POA: Insufficient documentation

## 2021-12-30 DIAGNOSIS — Z7984 Long term (current) use of oral hypoglycemic drugs: Secondary | ICD-10-CM | POA: Diagnosis not present

## 2021-12-30 DIAGNOSIS — D500 Iron deficiency anemia secondary to blood loss (chronic): Secondary | ICD-10-CM

## 2021-12-30 DIAGNOSIS — C649 Malignant neoplasm of unspecified kidney, except renal pelvis: Secondary | ICD-10-CM

## 2021-12-30 LAB — CMP (CANCER CENTER ONLY)
ALT: 20 U/L (ref 0–44)
AST: 18 U/L (ref 15–41)
Albumin: 2.8 g/dL — ABNORMAL LOW (ref 3.5–5.0)
Alkaline Phosphatase: 69 U/L (ref 38–126)
Anion gap: 11 (ref 5–15)
BUN: 14 mg/dL (ref 8–23)
CO2: 19 mmol/L — ABNORMAL LOW (ref 22–32)
Calcium: 8.8 mg/dL — ABNORMAL LOW (ref 8.9–10.3)
Chloride: 105 mmol/L (ref 98–111)
Creatinine: 0.9 mg/dL (ref 0.61–1.24)
GFR, Estimated: >60 mL/min (ref >60)
Glucose, Bld: 295 mg/dL — ABNORMAL HIGH (ref 70–99)
Potassium: 3.7 mmol/L (ref 3.5–5.1)
Sodium: 135 mmol/L (ref 135–145)
Total Bilirubin: 0.2 mg/dL — ABNORMAL LOW (ref 0.3–1.2)
Total Protein: 8.6 g/dL — ABNORMAL HIGH (ref 6.5–8.1)

## 2021-12-30 LAB — CBC WITH DIFFERENTIAL (CANCER CENTER ONLY)
Abs Immature Granulocytes: 0.06 10*3/uL (ref 0.00–0.07)
Basophils Absolute: 0 10*3/uL (ref 0.0–0.1)
Basophils Relative: 0 %
Eosinophils Absolute: 0.1 10*3/uL (ref 0.0–0.5)
Eosinophils Relative: 1 %
HCT: 28.7 % — ABNORMAL LOW (ref 39.0–52.0)
Hemoglobin: 8.8 g/dL — ABNORMAL LOW (ref 13.0–17.0)
Immature Granulocytes: 1 %
Lymphocytes Relative: 31 %
Lymphs Abs: 3.5 10*3/uL (ref 0.7–4.0)
MCH: 25.9 pg — ABNORMAL LOW (ref 26.0–34.0)
MCHC: 30.7 g/dL (ref 30.0–36.0)
MCV: 84.4 fL (ref 80.0–100.0)
Monocytes Absolute: 0.5 10*3/uL (ref 0.1–1.0)
Monocytes Relative: 5 %
Neutro Abs: 7 10*3/uL (ref 1.7–7.7)
Neutrophils Relative %: 62 %
Platelet Count: 630 10*3/uL — ABNORMAL HIGH (ref 150–400)
RBC: 3.4 MIL/uL — ABNORMAL LOW (ref 4.22–5.81)
RDW: 20.1 % — ABNORMAL HIGH (ref 11.5–15.5)
WBC Count: 11.2 10*3/uL — ABNORMAL HIGH (ref 4.0–10.5)
nRBC: 0 % (ref 0.0–0.2)

## 2021-12-30 LAB — SAMPLE TO BLOOD BANK

## 2021-12-30 LAB — TSH: TSH: 2.24 u[IU]/mL (ref 0.320–4.118)

## 2021-12-30 NOTE — Progress Notes (Signed)
Hematology and Oncology Follow Up Visit  [facility] Cen [id number] June 02, 1951 71 y.o. 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   Prior Therapy:  He is status post ultrasound biopsy of the hepatic lesion on December 01, 2020.  The final pathology confirmed the presence of metastatic clear-cell renal cell carcinoma.  Ipilimumab 1 mg/kg and nivolumab 3 mg/kg therapy is started on December 23, 2019.  He completed 3 cycles of therapy with progression of disease.    Current therapy: Cabometyx 40 mg daily started on March 04, 2021.  Therapy switched to 20 mg daily starting in July 2022.  Interim History: Mr. Loeza is here for a follow-up visit.  Since the last visit, he reports no major changes in his health.  He has reported a few episodic diarrhea associated with [employer] in the last few days.  He denies any shortness of breath or difficulty breathing.  He denies any abdominal pain.  He denies any fevers chills.  His performance status quality of life remains unchanged.     Medications: Updated on review. Current Outpatient Medications  Medication Sig Dispense Refill   atorvastatin (LIPITOR) 20 MG tablet TAKE 1 TABLET BY MOUTH DAILY. 90 tablet 2   acetaminophen (TYLENOL) 325 MG tablet Take 2 tablets (650 mg total) by mouth every 6 (six) hours as needed for mild pain (or Fever >/= 101).     Ascorbic Acid (VITAMIN C PO) Take by mouth.     CABOMETYX 20 MG tablet TAKE 1 TABLET DAILY. TAKE ON AN EMPTY STOMACH, 1 HOUR BEFORE OR 2 HOURS AFTER MEALS. 30 tablet 0   ferrous sulfate 325 (65 FE) MG EC tablet Take 1 tablet (325 mg total) by mouth in the morning and at bedtime. 60 tablet 3   ferrous sulfate 325 (65 FE) MG tablet TAKE 1 TABLET (325 MG TOTAL) BY MOUTH IN THE MORNING AND AT BEDTIME. 60 tablet 3    lisinopril-hydrochlorothiazide (ZESTORETIC) 10-12.5 MG tablet TAKE 1 TABLET BY MOUTH DAILY. 90 tablet 2   megestrol (MEGACE) 40 MG/ML suspension TAKE 10 MLS (400 MG TOTAL) BY MOUTH 2 (TWO) TIMES DAILY. 240 mL 0   megestrol (MEGACE) 40 MG/ML suspension SHAKE LIQUID AND TAKE 10 ML BY MOUTH EVERY DAY 900 mL 1   metFORMIN (GLUCOPHAGE) 500 MG tablet TAKE 1 TABLET (500 MG TOTAL) BY MOUTH DAILY WITH BREAKFAST. 30 tablet 1   mirtazapine (REMERON) 7.5 MG tablet Take 1 tablet (7.5 mg total) by mouth at bedtime. 30 tablet 3   No current facility-administered medications for this visit.     Allergies: No Known Allergies   Physical Exam:      Blood pressure 132/76, pulse (!) 119, temperature 97.7 F (36.5 C), temperature source Axillary, resp. rate 17, height 5\' 6"  (1.676 m), weight 153 lb 11.2 oz (69.7 kg), SpO2 100 %.      ECOG: 1    General appearance: Alert, awake without any distress. Head: Atraumatic without abnormalities Oropharynx: Without any thrush or ulcers. Eyes: No scleral icterus. Lymph nodes: No lymphadenopathy noted in the cervical, supraclavicular, or axillary nodes Heart:regular rate and rhythm, without any murmurs or gallops.   Lung: Clear to auscultation without any rhonchi, wheezes or dullness to percussion. Abdomin: Soft, nontender without any shifting dullness or ascites. Musculoskeletal: No clubbing or cyanosis. Neurological: No motor or sensory deficits. Skin: No rashes or  lesions.             Lab Results: Lab Results  Component Value Date   WBC 9.3 12/04/2021   HGB 8.8 (L) 12/04/2021   HCT 29.9 (L) 12/04/2021   MCV 88.7 12/04/2021   PLT 552 (H) 12/04/2021     Chemistry      Component Value Date/Time   NA 135 12/04/2021 1013   NA 139 12/25/2020 1236   K 3.7 12/04/2021 1013   CL 103 12/04/2021 1013   CO2 23 12/04/2021 1013   BUN 6 (L) 12/04/2021 1013   BUN 11 12/25/2020 1236   CREATININE 0.76 12/04/2021 1013   CREATININE 0.79  09/09/2021 1322   CREATININE 0.80 01/16/2016 1052      Component Value Date/Time   CALCIUM 9.0 12/04/2021 1013   ALKPHOS 76 09/09/2021 1322   AST 20 09/09/2021 1322   ALT 23 09/09/2021 1322   BILITOT <0.2 (L) 09/09/2021 1322        Impression and Plan:  71 year old man with:   1.  Kidney cancer diagnosed in 2021.  He was found to have stage IV clear-cell tumor with pulmonary and hepatic metastasis.  He continues to be on Cabometyx without any major complications.  CT scan in November 2022 showed overall stable disease.  Risks and benefits of continuing this treatment versus alternative treatment choices were reiterated.  These options include axitinib as well as lenvatinib among other choices.  He is agreeable to proceed at this time and we will update his staging scan before the next visit.  He is agreeable with this plan.   2.  Weight loss: His weight is stable at this time is eating better.     3.  Hydronephrosis: Related to his malignancy with kidney function remains normal.     4.    Dermatological toxicity: Related to Cabometyx which has improved with dose reductions.   5.  Goals of care and prognosis: Therapy remains palliative although aggressive measures are warranted given his reasonable performance status.   6.  Anemia: His hemoglobin continues to be close to baseline.  This is related to malignancy.  7.  Hypercalcemia: He has received Zometa in the past and will be repeated his calcium was elevated in the future.  8.  Follow-up: He will return in 2 months for a follow-up evaluation.   30  minutes were spent on this visit.  The time was dedicated to reviewing laboratory data, disease status update and outlining future plan of care review.      Zola Button, MD 1/18/202312:12 PM

## 2022-01-17 ENCOUNTER — Other Ambulatory Visit: Payer: Self-pay | Admitting: Oncology

## 2022-01-18 ENCOUNTER — Encounter: Payer: Self-pay | Admitting: Hematology

## 2022-01-22 ENCOUNTER — Other Ambulatory Visit: Payer: Self-pay | Admitting: Internal Medicine

## 2022-01-22 DIAGNOSIS — R7303 Prediabetes: Secondary | ICD-10-CM

## 2022-01-22 NOTE — Telephone Encounter (Signed)
Requested medications are due for refill today.  yes  Requested medications are on the active medications list.  yes  Last refill. 11/23/2021 #30 1 refill  Future visit scheduled.   yes  Notes to clinic.  Pt is more than 3 months overdue for an office visit. Labs are expired.    Requested Prescriptions  Pending Prescriptions Disp Refills   metFORMIN (GLUCOPHAGE) 500 MG tablet 30 tablet 1    Sig: TAKE 1 TABLET (500 MG TOTAL) BY MOUTH DAILY WITH BREAKFAST.     Endocrinology:  Diabetes - Biguanides Failed - 01/22/2022  2:48 PM      Failed - HBA1C is between 0 and 7.9 and within 180 days    HbA1c POC (<> result, manual entry)  Date Value Ref Range Status  09/26/2018 5.6 4.0 - 5.6 % Final   Hgb A1c MFr Bld  Date Value Ref Range Status  12/09/2020 6.1 (H) 4.8 - 5.6 % Final    Comment:    (NOTE) Pre diabetes:          5.7%-6.4%  Diabetes:              >6.4%  Glycemic control for   <7.0% adults with diabetes           Failed - B12 Level in normal range and within 720 days    Vitamin Cantu-12  Date Value Ref Range Status  05/02/2017 417 232 - 1,245 pg/mL Final          Failed - Valid encounter within last 6 months    Recent Outpatient Visits           10 months ago Cantu discharge follow-up   [place], Chad B, MD   1 year ago Cantu discharge follow-up   Chad Cantu, Chad B, MD   1 year ago Alcohol use disorder, severe, dependence Chad Cantu)   [place], Chad B, MD   1 year ago Essential hypertension   Chad Cantu, Chad B, MD   3 years ago [facility], [place], [place]       Future Appointments             In 3 days Chad Pier, MD [place] in normal range and within 360 days     Creatinine  Date Value Ref Range Status  12/30/2021 0.90 0.61 - 1.24 mg/dL Final   Creat  Date Value Ref Range Status  01/16/2016 0.80 0.70 - 1.25 mg/dL Final          Passed - eGFR in normal range and within 360 days    GFR, Est African American  Date Value Ref Range Status  04/08/2015 >89 mL/min Final   GFR calc Af Amer  Date Value Ref Range Status  12/25/2020 107 >59 mL/min/1.73 Final    Comment:    **In accordance with recommendations from the NKF-ASN Task force,**   [facility] is in the process of updating its eGFR calculation to the   2021 CKD-EPI creatinine equation that estimates kidney function   without a race variable.    GFR, Est Non African American  Date Value Ref Range Status  04/08/2015 >89 mL/min Final    Comment:  The estimated GFR is a calculation valid for adults (>=64 years old) that uses the CKD-EPI algorithm to adjust for age and sex. It is   not to be used for children, pregnant women, hospitalized patients,    patients on dialysis, or with rapidly changing kidney function. According to the NKDEP, eGFR >89 is normal, 60-89 shows mild impairment, 30-59 shows moderate impairment, 15-29 shows severe impairment and <15 is ESRD.      GFR, Estimated  Date Value Ref Range Status  12/30/2021 >60 >60 mL/min Final    Comment:    (NOTE) Calculated using the CKD-EPI Creatinine Equation (2021)           Passed - CBC within normal limits and completed in the last 12 months    WBC  Date Value Ref Range Status  12/04/2021 9.3 4.0 - 10.5 K/uL Final   WBC Count  Date Value Ref Range Status  12/30/2021 11.2 (H) 4.0 - 10.5 K/uL Final   RBC  Date Value Ref Range Status  12/30/2021 3.40 (L) 4.22 - 5.81 MIL/uL Final   Hemoglobin  Date Value Ref Range Status  12/30/2021 8.8 (L) 13.0 - 17.0 g/dL Final  12/25/2020 9.9 (L) 13.0 - 17.7 g/dL Final   HCT  Date Value Ref Range Status  12/30/2021 28.7 (L) 39.0 - 52.0 % Final   Hematocrit  Date Value  Ref Range Status  12/25/2020 31.8 (L) 37.5 - 51.0 % Final   MCHC  Date Value Ref Range Status  12/30/2021 30.7 30.0 - 36.0 g/dL Final   [facility]  Date Value Ref Range Status  12/30/2021 25.9 (L) 26.0 - 34.0 pg Final   MCV  Date Value Ref Range Status  12/30/2021 84.4 80.0 - 100.0 fL Final  12/25/2020 79 79 - 97 fL Final   No results found for: PLTCOUNTKUC, LABPLAT, POCPLA RDW  Date Value Ref Range Status  12/30/2021 20.1 (H) 11.5 - 15.5 % Final  12/25/2020 14.4 11.6 - 15.4 % Final

## 2022-01-22 NOTE — Telephone Encounter (Signed)
Medication Refill - Medication:  metFORMIN (GLUCOPHAGE) 500 MG tablet   Has the patient contacted their pharmacy? No. Could not get through to pharmacy  Preferred Pharmacy (with phone number or street name):  [facility] [address], [place], [place] [address]  Phone:  [phone]  Fax:  [phone]   Has the patient been seen for an appointment in the last year OR does the patient have an upcoming appointment? Yes.    Agent: Please be advised that RX refills may take up to 3 business days. We ask that you follow-up with your pharmacy.

## 2022-01-25 ENCOUNTER — Other Ambulatory Visit: Payer: Self-pay

## 2022-01-25 ENCOUNTER — Other Ambulatory Visit: Payer: Self-pay | Admitting: Internal Medicine

## 2022-01-25 ENCOUNTER — Ambulatory Visit: Payer: Medicare Other | Attending: Internal Medicine | Admitting: Internal Medicine

## 2022-01-25 ENCOUNTER — Encounter: Payer: Self-pay | Admitting: Internal Medicine

## 2022-01-25 VITALS — BP 128/92 | HR 123 | Resp 16 | Wt 152.6 lb

## 2022-01-25 DIAGNOSIS — R29898 Other symptoms and signs involving the musculoskeletal system: Secondary | ICD-10-CM | POA: Diagnosis not present

## 2022-01-25 DIAGNOSIS — C649 Malignant neoplasm of unspecified kidney, except renal pelvis: Secondary | ICD-10-CM | POA: Insufficient documentation

## 2022-01-25 DIAGNOSIS — J3489 Other specified disorders of nose and nasal sinuses: Secondary | ICD-10-CM | POA: Diagnosis not present

## 2022-01-25 DIAGNOSIS — C7800 Secondary malignant neoplasm of unspecified lung: Secondary | ICD-10-CM | POA: Diagnosis not present

## 2022-01-25 DIAGNOSIS — Z79899 Other long term (current) drug therapy: Secondary | ICD-10-CM | POA: Diagnosis not present

## 2022-01-25 DIAGNOSIS — F1021 Alcohol dependence, in remission: Secondary | ICD-10-CM

## 2022-01-25 DIAGNOSIS — R000 Tachycardia, unspecified: Secondary | ICD-10-CM | POA: Diagnosis not present

## 2022-01-25 DIAGNOSIS — I10 Essential (primary) hypertension: Secondary | ICD-10-CM

## 2022-01-25 DIAGNOSIS — R7303 Prediabetes: Secondary | ICD-10-CM | POA: Diagnosis not present

## 2022-01-25 DIAGNOSIS — D649 Anemia, unspecified: Secondary | ICD-10-CM

## 2022-01-25 LAB — POCT GLYCOSYLATED HEMOGLOBIN (HGB A1C): HbA1c, POC (controlled diabetic range): 6.2 % (ref 0.0–7.0)

## 2022-01-25 LAB — GLUCOSE, POCT (MANUAL RESULT ENTRY): POC Glucose: 95 mg/dl (ref 70–99)

## 2022-01-25 NOTE — Progress Notes (Signed)
Patient ID: Chad Cantu, male    DOB: [birth year]  MRN: [id number]  CC: Hypertension   Subjective: Chad Cantu is a 71 y.o. male who presents for chronic ds management.  Wife is with him His concerns today include:  Hx of prediabetes, HTN, tob dep, HL, CTS RT, ETOH use disorder.  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 Patient recently has been eating bags of candy Q 2 days.  Also snacks on cookies Drinks a lot of water.  Denies polyuria or blurred vision.  Renal Cell Carcinoma: Followed by Dr. Alen Blew.  He has a normocytic anemia associated with his cancer.  He is not on iron. Good appetite  HTN: He is on lisinopril/HCTZ 10/12.5 mg.  His wife checks his blood pressure about once a week.  She reports his range has been about 130/80-81.  Pulse rate noted to be elevated today.  Patient denies any palpitations.  He has not had any lower extremity edema, chest pain or shortness of breath.  Blows his nose and sneezes a lot.  + rhinorrhea.  No itchy throat.  Sometimes when he blows his nose, there may be streaks of blood Using OTC nasal spray and Zyrtec.  Not helping  HL: Wife is requesting that his CK level be checked.  He is on atorvastatin.  Patient reports weakness in the legs sometimes.  No muscle cramps or muscle pains.  Had a recent fall when he was reaching for something and tripped.  No injuries.  He has remained free of  alcohol. Patient Active Problem List   Diagnosis Date Noted   COVID-19 08/25/2021   Symptomatic anemia 02/18/2021   History of recent fall 12/25/2020   Hypercalcemia 12/22/2020   Metastatic renal cell carcinoma ([place]) 12/09/2020   Goals of care, counseling/discussion 12/09/2020   AKI (acute kidney injury) ([place]) 12/09/2020   Leukocytosis 12/09/2020   Alcohol dependence with uncomplicated withdrawal ([place]) 11/16/2020   Near syncope 09/20/2020   Dehydration 09/20/2020   Alcohol abuse 09/20/2020   Alcohol use disorder, severe, dependence ([place])    Memory difficulty 05/02/2017   Rash, skin 05/02/2017   Overactive bladder 05/02/2017   Alcohol use disorder 05/01/2017   HLD (hyperlipidemia) 04/09/2015   Essential hypertension 04/08/2015   Tobacco abuse 04/08/2015     Current Outpatient Medications on File Prior to Visit  Medication Sig Dispense Refill   atorvastatin (LIPITOR) 20 MG tablet TAKE 1 TABLET BY MOUTH DAILY. 90 tablet 2   acetaminophen (TYLENOL) 325 MG tablet Take 2 tablets (650 mg total) by mouth every 6 (six) hours as needed for mild pain (or Fever >/= 101).     Ascorbic Acid (VITAMIN C PO) Take by mouth.     CABOMETYX 20 MG tablet TAKE 1 TABLET DAILY. TAKE ON AN EMPTY STOMACH, 1 HOUR BEFORE OR 2 HOURS AFTER MEALS. [place]  tablet 0   ferrous sulfate 325 (65 FE) MG tablet TAKE 1 TABLET (325 MG TOTAL) BY MOUTH IN THE MORNING AND AT BEDTIME. 60 tablet 3   lisinopril-hydrochlorothiazide (ZESTORETIC) 10-12.5 MG tablet TAKE 1 TABLET BY MOUTH DAILY. 90 tablet 2   megestrol (MEGACE) 40 MG/ML suspension SHAKE LIQUID AND TAKE 10 ML BY MOUTH EVERY DAY 900 mL 1   metFORMIN (GLUCOPHAGE) 500 MG tablet TAKE 1 TABLET (500 MG TOTAL) BY MOUTH DAILY WITH BREAKFAST. 30 tablet 1   mirtazapine (REMERON) 7.5 MG tablet Take 1 tablet (7.5 mg total) by mouth at bedtime. 30 tablet 3   [DISCONTINUED] lisinopril (ZESTRIL) 20 MG tablet Take 20 mg by mouth daily.     [DISCONTINUED] prochlorperazine (COMPAZINE) 10  MG tablet Take 1 tablet (10 mg total) by mouth every 6 (six) hours as needed for nausea or vomiting. (Patient not taking: Reported on 02/18/2021) 30 tablet 0   No current facility-administered medications on file prior to visit.    No Known Allergies  Social History   Socioeconomic History   Marital status: Married    Spouse name: Not on file   Number of children: Not on file   Years of education: Not on file   Highest education level: Not on file  Occupational History   Occupation: security guard  Tobacco Use   Smoking status: Former    Years: 55.00    Types: Cigarettes   Smokeless tobacco: Never  Vaping Use   Vaping Use: Never used  Substance and Sexual Activity   Alcohol use: Not Currently    Alcohol/week: 12.0 standard drinks    Types: 12 Cans of beer per week   Drug use: Not Currently    Types: Marijuana    Comment: marijuana maybe 2 months ago, remote h/o cocaine use   Sexual activity: Yes  Other Topics Concern   Not on file  Social History Narrative   Not on file   Social Determinants of Health   Financial Resource Strain: Not on file  Food Insecurity: Not on file  Transportation Needs: Not on file  Physical Activity: Not on file  Stress: Not on file  Social Connections: Not on file  Intimate Partner Violence: Not on file    Family History  Problem Relation Age of Onset   Diabetes Mother    Colon cancer Neg Hx    Colon polyps Neg Hx    Esophageal cancer Neg Hx    Rectal cancer Neg Hx    Stomach cancer Neg Hx     Past Surgical History:  Procedure Laterality Date   COLONOSCOPY  5 years ago?   at [facility] maybe per pt,?unsure results   LAPAROSCOPIC GASTROTOMY W/ REPAIR OF ULCER      ROS: Review of Systems Negative except as stated above  PHYSICAL EXAM: BP (!) 128/92    Pulse (!) 123    Resp 16    Wt 152 lb 9.6 oz (69.2 kg)    SpO2 99%    BMI 24.63 kg/m   Physical Exam Sitting: BP 132/94, pulse 123 Standing BP 112/81 pulse 128  General  appearance -elderly African-American male in NAD. Mental status -patient is oriented and answers questions appropriately. Nose -nasal mucosa is dry at this time.  No enlargement of nasal turbinates noted. Mouth - mucous membranes moist, pharynx normal without lesions Neck - supple, no significant adenopathy Chest - clear to auscultation, no wheezes, rales or rhonchi, symmetric air entry Heart -tachycardic but regular.  No  gallops or murmurs. Extremities -no lower extremity edema. Neuro: Power lower extremities 5/5 bilaterally proximally and distally. CMP Latest Ref Rng & Units 12/30/2021 12/04/2021 10/27/2021  Glucose 70 - 99 mg/dL 295(H) 173(H) -  BUN 8 - 23 mg/dL 14 6(L) -  Creatinine 0.61 - 1.24 mg/dL 0.90 0.76 0.70  Sodium 135 - 145 mmol/L 135 135 -  Potassium 3.5 - 5.1 mmol/L 3.7 3.7 -  Chloride 98 - 111 mmol/L 105 103 -  CO2 22 - 32 mmol/L 19(L) 23 -  Calcium 8.9 - 10.3 mg/dL 8.8(L) 9.0 -  Total Protein 6.5 - 8.1 g/dL 8.6(H) - -  Total Bilirubin 0.3 - 1.2 mg/dL 0.2(L) - -  Alkaline Phos 38 - 126 U/L 69 - -  AST 15 - 41 U/L 18 - -  ALT 0 - 44 U/L 20 - -   Lipid Panel     Component Value Date/Time   CHOL 132 06/05/2020 1618   TRIG 145 06/05/2020 1618   HDL 44 06/05/2020 1618   CHOLHDL 3.0 06/05/2020 1618   CHOLHDL 6.5 (H) 01/16/2016 1052   VLDL NOT CALC 01/16/2016 1052   LDLCALC 63 06/05/2020 1618    CBC    Component Value Date/Time   WBC 11.2 (H) 12/30/2021 1214   WBC 9.3 12/04/2021 1013   RBC 3.40 (L) 12/30/2021 1214   HGB 8.8 (L) 12/30/2021 1214   HGB 9.9 (L) 12/25/2020 1236   HCT 28.7 (L) 12/30/2021 1214   HCT 31.8 (L) 12/25/2020 1236   PLT 630 (H) 12/30/2021 1214   PLT 513 (H) 12/25/2020 1236   MCV 84.4 12/30/2021 1214   MCV 79 12/25/2020 1236   MCH 25.9 (L) 12/30/2021 1214   MCHC 30.7 12/30/2021 1214   RDW 20.1 (H) 12/30/2021 1214   RDW 14.4 12/25/2020 1236   LYMPHSABS 3.5 12/30/2021 1214   MONOABS 0.5 12/30/2021 1214   EOSABS 0.1 12/30/2021 1214    BASOSABS 0.0 12/30/2021 1214    ASSESSMENT AND PLAN: 1. Essential hypertension Not at goal of 130/80 or lower.  However I have made no changes to the dose of lisinopril/hydrochlorothiazide as home blood pressure readings have been okay.  2. Prediabetes Dietary counseling given.  Advised patient to cut back on sweets.  Discussed healthier snacks like fruits and nuts. - POCT glucose (manual entry) - POCT glycosylated hemoglobin (Hb A1C)  3. Tachycardia Orthostatic check is positive.  He may be a little dehydrated.  Encouraged him to push fluids. Recent TSH was normal. I will recheck CBC and chemistry - CBC - Basic metabolic panel  4. Rhinorrhea Recommend trying saline nasal spray over-the-counter.  Can continue Zyrtec or Claritin  5. Chronic anemia Followed by hematology and secondary to his underlying cancer  6. Complaints of leg weakness No weakness detected on exam today.  However we will check the CK level. - CK  7.  Alcohol use disorder moderate in sustained remission. -Commended him on that.  Patient was given the opportunity to ask questions.  Patient verbalized understanding of the plan and was able to repeat key elements of the plan.   Orders Placed This Encounter  Procedures   CBC   Basic metabolic panel   CK   POCT glucose (manual entry)   POCT glycosylated hemoglobin (Hb A1C)     Requested Prescriptions    No prescriptions requested or ordered in this encounter    Return in about 4 months (around 05/25/2022).  Karle Plumber, MD, FACP

## 2022-01-26 ENCOUNTER — Other Ambulatory Visit: Payer: Self-pay

## 2022-01-26 ENCOUNTER — Ambulatory Visit: Payer: Medicare Other | Attending: Internal Medicine

## 2022-01-26 MED ORDER — METFORMIN HCL 500 MG PO TABS
ORAL_TABLET | Freq: Every day | ORAL | 3 refills | Status: DC
  Filled 2022-01-26: qty 30, 30d supply, fill #0
  Filled 2022-02-22: qty 30, 30d supply, fill #1
  Filled 2022-04-01: qty 30, 30d supply, fill #2
  Filled 2022-04-29: qty 30, 30d supply, fill #3

## 2022-01-27 ENCOUNTER — Other Ambulatory Visit (HOSPITAL_COMMUNITY): Payer: Self-pay

## 2022-01-27 ENCOUNTER — Telehealth: Payer: Self-pay | Admitting: Internal Medicine

## 2022-01-27 LAB — BASIC METABOLIC PANEL
BUN/Creatinine Ratio: 14 (ref 10–24)
BUN: 11 mg/dL (ref 8–27)
CO2: 20 mmol/L (ref 20–29)
Calcium: 9.1 mg/dL (ref 8.6–10.2)
Chloride: 102 mmol/L (ref 96–106)
Creatinine, Ser: 0.8 mg/dL (ref 0.76–1.27)
Glucose: 84 mg/dL (ref 70–99)
Potassium: 4.5 mmol/L (ref 3.5–5.2)
Sodium: 139 mmol/L (ref 134–144)
eGFR: 96 mL/min/{1.73_m2} (ref >59)

## 2022-01-27 LAB — CBC
Hematocrit: 28.3 % — ABNORMAL LOW (ref 37.5–51.0)
Hemoglobin: 9 g/dL — ABNORMAL LOW (ref 13.0–17.7)
MCH: 25.6 pg — ABNORMAL LOW (ref 26.6–33.0)
MCHC: 31.8 g/dL (ref 31.5–35.7)
MCV: 81 fL (ref 79–97)
Platelets: 611 10*3/uL — ABNORMAL HIGH (ref 150–450)
RBC: 3.51 x10E6/uL — ABNORMAL LOW (ref 4.14–5.80)
RDW: 18.6 % — ABNORMAL HIGH (ref 11.6–15.4)
WBC: 12.8 10*3/uL — ABNORMAL HIGH (ref 3.4–10.8)

## 2022-01-27 LAB — CK: Total CK: 93 U/L (ref 41–331)

## 2022-01-27 NOTE — Telephone Encounter (Signed)
Phone call placed to patient today to go over lab results.  I left a message on his voicemail letting him know that I was calling to go over the lab results.  Informed him that I will send him his results via [place].

## 2022-02-08 ENCOUNTER — Telehealth: Payer: Self-pay

## 2022-02-08 NOTE — Telephone Encounter (Signed)
Pt's wife called to report that patient has been exhibiting diarrhea (last episode on 02/06/22 with 5 loose stools (relieved by Imodium), dizziness, weakness, SOB, HA from coughing related to increased mucous. Patient reports that this has been going on for 3 months. Wife relates these symptoms to side effects listed for Cabometyx. She is requesting apt to discuss with Dr. Alen Blew.

## 2022-02-08 NOTE — Telephone Encounter (Signed)
Per Dr. Alen Blew, called pt to let them know to stop taking Cabometyx and to start taking imodium for diarrhea. Patient instructed to call office in one week to update on how pt is feeling. Wife verbalized understanding.

## 2022-02-16 ENCOUNTER — Other Ambulatory Visit: Payer: Self-pay | Admitting: Oncology

## 2022-02-22 ENCOUNTER — Other Ambulatory Visit: Payer: Self-pay

## 2022-02-23 ENCOUNTER — Other Ambulatory Visit: Payer: Self-pay

## 2022-02-24 ENCOUNTER — Telehealth: Payer: Self-pay | Admitting: *Deleted

## 2022-02-24 NOTE — Telephone Encounter (Signed)
Notified that he is OK to restart cabometyx per Dr Alen Blew ?

## 2022-02-24 NOTE — Telephone Encounter (Signed)
Wife is asking if Chad Cantu should restart cabometyx now. Diarrhea resolved, headache gone, still a little dizzy and weak. Has lab and CT tomorrow. ?

## 2022-02-25 ENCOUNTER — Inpatient Hospital Stay: Payer: Medicare Other | Attending: Oncology

## 2022-02-25 ENCOUNTER — Ambulatory Visit (HOSPITAL_COMMUNITY)
Admission: RE | Admit: 2022-02-25 | Discharge: 2022-02-25 | Disposition: A | Discharge status: Home / Self Care | Payer: Medicare Other | Source: Ambulatory Visit | Attending: Oncology | Admitting: Oncology

## 2022-02-25 ENCOUNTER — Other Ambulatory Visit: Payer: Self-pay

## 2022-02-25 DIAGNOSIS — D490 Neoplasm of unspecified behavior of digestive system: Secondary | ICD-10-CM | POA: Diagnosis not present

## 2022-02-25 DIAGNOSIS — M2580 Other specified joint disorders, unspecified joint: Secondary | ICD-10-CM | POA: Insufficient documentation

## 2022-02-25 DIAGNOSIS — C7801 Secondary malignant neoplasm of right lung: Secondary | ICD-10-CM | POA: Insufficient documentation

## 2022-02-25 DIAGNOSIS — N1339 Other hydronephrosis: Secondary | ICD-10-CM | POA: Diagnosis not present

## 2022-02-25 DIAGNOSIS — I700 Atherosclerosis of aorta: Secondary | ICD-10-CM | POA: Diagnosis not present

## 2022-02-25 DIAGNOSIS — C641 Malignant neoplasm of right kidney, except renal pelvis: Secondary | ICD-10-CM | POA: Diagnosis present

## 2022-02-25 DIAGNOSIS — E032 Hypothyroidism due to medicaments and other exogenous substances: Secondary | ICD-10-CM

## 2022-02-25 DIAGNOSIS — R590 Localized enlarged lymph nodes: Secondary | ICD-10-CM | POA: Insufficient documentation

## 2022-02-25 DIAGNOSIS — J439 Emphysema, unspecified: Secondary | ICD-10-CM | POA: Insufficient documentation

## 2022-02-25 DIAGNOSIS — M12851 Other specific arthropathies, not elsewhere classified, right hip: Secondary | ICD-10-CM | POA: Insufficient documentation

## 2022-02-25 DIAGNOSIS — Z85528 Personal history of other malignant neoplasm of kidney: Secondary | ICD-10-CM | POA: Diagnosis not present

## 2022-02-25 DIAGNOSIS — D4959 Neoplasm of unspecified behavior of other genitourinary organ: Secondary | ICD-10-CM | POA: Diagnosis not present

## 2022-02-25 DIAGNOSIS — C787 Secondary malignant neoplasm of liver and intrahepatic bile duct: Secondary | ICD-10-CM | POA: Insufficient documentation

## 2022-02-25 DIAGNOSIS — N2889 Other specified disorders of kidney and ureter: Secondary | ICD-10-CM | POA: Diagnosis not present

## 2022-02-25 DIAGNOSIS — Z79899 Other long term (current) drug therapy: Secondary | ICD-10-CM | POA: Insufficient documentation

## 2022-02-25 DIAGNOSIS — T50905A Adverse effect of unspecified drugs, medicaments and biological substances, initial encounter: Secondary | ICD-10-CM | POA: Diagnosis not present

## 2022-02-25 DIAGNOSIS — Z9221 Personal history of antineoplastic chemotherapy: Secondary | ICD-10-CM | POA: Diagnosis not present

## 2022-02-25 DIAGNOSIS — R918 Other nonspecific abnormal finding of lung field: Secondary | ICD-10-CM | POA: Insufficient documentation

## 2022-02-25 DIAGNOSIS — I3139 Other pericardial effusion (noninflammatory): Secondary | ICD-10-CM | POA: Diagnosis not present

## 2022-02-25 DIAGNOSIS — C7800 Secondary malignant neoplasm of unspecified lung: Secondary | ICD-10-CM | POA: Insufficient documentation

## 2022-02-25 DIAGNOSIS — Z7984 Long term (current) use of oral hypoglycemic drugs: Secondary | ICD-10-CM | POA: Insufficient documentation

## 2022-02-25 DIAGNOSIS — C642 Malignant neoplasm of left kidney, except renal pelvis: Secondary | ICD-10-CM | POA: Insufficient documentation

## 2022-02-25 DIAGNOSIS — R160 Hepatomegaly, not elsewhere classified: Secondary | ICD-10-CM | POA: Diagnosis not present

## 2022-02-25 DIAGNOSIS — D649 Anemia, unspecified: Secondary | ICD-10-CM | POA: Insufficient documentation

## 2022-02-25 DIAGNOSIS — M12852 Other specific arthropathies, not elsewhere classified, left hip: Secondary | ICD-10-CM | POA: Diagnosis not present

## 2022-02-25 DIAGNOSIS — C7802 Secondary malignant neoplasm of left lung: Secondary | ICD-10-CM | POA: Insufficient documentation

## 2022-02-25 DIAGNOSIS — C649 Malignant neoplasm of unspecified kidney, except renal pelvis: Secondary | ICD-10-CM

## 2022-02-25 LAB — CBC WITH DIFFERENTIAL (CANCER CENTER ONLY)
Abs Immature Granulocytes: 0.06 10*3/uL (ref 0.00–0.07)
Basophils Absolute: 0.1 10*3/uL (ref 0.0–0.1)
Basophils Relative: 1 %
Eosinophils Absolute: 0.1 10*3/uL (ref 0.0–0.5)
Eosinophils Relative: 1 %
HCT: 27.4 % — ABNORMAL LOW (ref 39.0–52.0)
Hemoglobin: 8.3 g/dL — ABNORMAL LOW (ref 13.0–17.0)
Immature Granulocytes: 1 %
Lymphocytes Relative: 40 %
Lymphs Abs: 5 10*3/uL — ABNORMAL HIGH (ref 0.7–4.0)
MCH: 26.1 pg (ref 26.0–34.0)
MCHC: 30.3 g/dL (ref 30.0–36.0)
MCV: 86.2 fL (ref 80.0–100.0)
Monocytes Absolute: 1.4 10*3/uL — ABNORMAL HIGH (ref 0.1–1.0)
Monocytes Relative: 11 %
Neutro Abs: 6 10*3/uL (ref 1.7–7.7)
Neutrophils Relative %: 46 %
Platelet Count: 643 10*3/uL — ABNORMAL HIGH (ref 150–400)
RBC: 3.18 MIL/uL — ABNORMAL LOW (ref 4.22–5.81)
RDW: 20.9 % — ABNORMAL HIGH (ref 11.5–15.5)
Smear Review: NORMAL
WBC Count: 12.6 10*3/uL — ABNORMAL HIGH (ref 4.0–10.5)
nRBC: 0 % (ref 0.0–0.2)

## 2022-02-25 LAB — CMP (CANCER CENTER ONLY)
ALT: 14 U/L (ref 0–44)
AST: 14 U/L — ABNORMAL LOW (ref 15–41)
Albumin: 3 g/dL — ABNORMAL LOW (ref 3.5–5.0)
Alkaline Phosphatase: 65 U/L (ref 38–126)
Anion gap: 12 (ref 5–15)
BUN: 14 mg/dL (ref 8–23)
CO2: 23 mmol/L (ref 22–32)
Calcium: 10.9 mg/dL — ABNORMAL HIGH (ref 8.9–10.3)
Chloride: 101 mmol/L (ref 98–111)
Creatinine: 0.9 mg/dL (ref 0.61–1.24)
GFR, Estimated: >60 mL/min (ref >60)
Glucose, Bld: 103 mg/dL — ABNORMAL HIGH (ref 70–99)
Potassium: 3.8 mmol/L (ref 3.5–5.1)
Sodium: 136 mmol/L (ref 135–145)
Total Bilirubin: 0.2 mg/dL — ABNORMAL LOW (ref 0.3–1.2)
Total Protein: 9.6 g/dL — ABNORMAL HIGH (ref 6.5–8.1)

## 2022-02-25 LAB — TSH: TSH: 3.955 u[IU]/mL (ref 0.320–4.118)

## 2022-02-25 MED ORDER — SODIUM CHLORIDE (PF) 0.9 % IJ SOLN
INTRAMUSCULAR | Status: AC
  Filled 2022-02-25: qty 50

## 2022-02-25 MED ORDER — IOHEXOL 300 MG/ML  SOLN
100.0000 mL | Freq: Once | INTRAMUSCULAR | Status: AC | PRN
  Administered 2022-02-25: 100 mL via INTRAVENOUS

## 2022-03-03 ENCOUNTER — Inpatient Hospital Stay (HOSPITAL_BASED_OUTPATIENT_CLINIC_OR_DEPARTMENT_OTHER): Payer: Medicare Other | Admitting: Oncology

## 2022-03-03 ENCOUNTER — Other Ambulatory Visit: Payer: Self-pay

## 2022-03-03 VITALS — BP 132/89 | HR 113 | Temp 97.7°F | Resp 15 | Ht 66.0 in | Wt 149.3 lb

## 2022-03-03 DIAGNOSIS — C642 Malignant neoplasm of left kidney, except renal pelvis: Secondary | ICD-10-CM | POA: Insufficient documentation

## 2022-03-03 DIAGNOSIS — C787 Secondary malignant neoplasm of liver and intrahepatic bile duct: Secondary | ICD-10-CM | POA: Insufficient documentation

## 2022-03-03 DIAGNOSIS — Z7984 Long term (current) use of oral hypoglycemic drugs: Secondary | ICD-10-CM | POA: Diagnosis not present

## 2022-03-03 DIAGNOSIS — C7800 Secondary malignant neoplasm of unspecified lung: Secondary | ICD-10-CM | POA: Diagnosis present

## 2022-03-03 DIAGNOSIS — C641 Malignant neoplasm of right kidney, except renal pelvis: Secondary | ICD-10-CM | POA: Diagnosis not present

## 2022-03-03 DIAGNOSIS — Z79899 Other long term (current) drug therapy: Secondary | ICD-10-CM | POA: Insufficient documentation

## 2022-03-03 DIAGNOSIS — D649 Anemia, unspecified: Secondary | ICD-10-CM | POA: Diagnosis not present

## 2022-03-03 NOTE — Progress Notes (Signed)
Hematology and Oncology Follow Up Visit ? ?Chad Cantu ?[id number] ?1951/04/17 71 y.o. ?03/03/2022 3:20 PM ?[name], MDJohnson, [name], MD  ? ?Principle Diagnosis: 71 year old man with kidney cancer diagnosed in December 2021.  He was found to have stage IV clear-cell  with hepatic and pulmonary involvement. ? ? ?Prior Therapy: ? ?He is status post ultrasound biopsy of the hepatic lesion on December 01, 2020.  The final pathology confirmed the presence of metastatic clear-cell renal cell carcinoma. ? ?Ipilimumab 1 mg/kg and nivolumab 3 mg/kg therapy is started on December 23, 2019.  He completed 3 cycles of therapy with progression of disease. ? ? ? ?Current therapy: Cabometyx 40 mg daily started on March 04, 2021.  Therapy switched to 20 mg daily starting in July 2022. ? ?Interim History: Mr. Ruderman is here for a follow-up evaluation.  Since last visit, he reports a few complaints at this time.  He has reported symptoms of anorexia, fatigue and diarrhea and has lost more weight.  He is still able to eat and drink liquid but has decreased in the last few weeks.  He denies any nausea, vomiting but has reported loose bowel habits.  He denies any hematochezia or melena.  Performance status quality of life remains intact at this time. ? ? ? ? ?Medications: Reviewed without changes. ?Current Outpatient Medications  ?Medication Sig Dispense Refill  ? atorvastatin (LIPITOR) 20 MG tablet TAKE 1 TABLET BY MOUTH DAILY. 90 tablet 2  ? acetaminophen (TYLENOL) 325 MG tablet Take 2 tablets (650 mg total) by mouth every 6 (six) hours as needed for mild pain (or Fever >/= 101).    ? Ascorbic Acid (VITAMIN C PO) Take by mouth.    ? CABOMETYX 20 MG tablet TAKE 1 TABLET DAILY. TAKE ON AN EMPTY STOMACH, 1 HOUR BEFORE OR 2 HOURS AFTER MEALS. 30 tablet 0  ? ferrous sulfate 325 (65 FE) MG tablet TAKE 1 TABLET (325 MG TOTAL) BY MOUTH IN THE MORNING AND AT BEDTIME. 60 tablet 3  ? lisinopril-hydrochlorothiazide (ZESTORETIC)  10-12.5 MG tablet TAKE 1 TABLET BY MOUTH DAILY. 90 tablet 2  ? megestrol (MEGACE) 40 MG/ML suspension SHAKE LIQUID AND TAKE 10 ML BY MOUTH EVERY DAY 900 mL 1  ? metFORMIN (GLUCOPHAGE) 500 MG tablet TAKE 1 TABLET (500 MG TOTAL) BY MOUTH DAILY WITH BREAKFAST. 30 tablet 3  ? mirtazapine (REMERON) 7.5 MG tablet Take 1 tablet (7.5 mg total) by mouth at bedtime. 30 tablet 3  ? ?No current facility-administered medications for this visit.  ? ? ? ?Allergies: No Known Allergies ? ? ?Physical Exam: ? ? ? ? ? ? ? ?Blood pressure 132/89, pulse (!) 113, temperature 97.7 ?F (36.5 ?C), temperature source Temporal, resp. rate 15, height '5\' 6"'$  (1.676 m), weight 149 lb 4.8 oz (67.7 kg), SpO2 97 %. ? ? ? ? ?ECOG: 1 ? ? ?General appearance: Comfortable appearing without any discomfort ?Head: Normocephalic without any trauma ?Oropharynx: Mucous membranes are moist and pink without any thrush or ulcers. ?Eyes: Pupils are equal and round reactive to light. ?Lymph nodes: No cervical, supraclavicular, inguinal or axillary lymphadenopathy.   ?Heart:regular rate and rhythm.  S1 and S2 without leg edema. ?Lung: Clear without any rhonchi or wheezes.  No dullness to percussion. ?Abdomin: Soft, nontender, nondistended with good bowel sounds.  No hepatosplenomegaly. ?Musculoskeletal: No joint deformity or effusion.  Full range of motion noted. ?Neurological: No deficits noted on motor, sensory and deep tendon reflex exam. ?Skin: No petechial rash or dryness.  Appeared moist.  ? ? ? ? ? ? ? ? ? ? ? ? ?Lab Results: ?Lab Results  ?Component Value Date  ? WBC 12.6 (H) 02/25/2022  ? HGB 8.3 (L) 02/25/2022  ? HCT 27.4 (L) 02/25/2022  ? MCV 86.2 02/25/2022  ? PLT 643 (H) 02/25/2022  ? ?  Chemistry   ?   ?Component Value Date/Time  ? NA 136 02/25/2022 1134  ? NA 139 01/26/2022 0934  ? K 3.8 02/25/2022 1134  ? CL 101 02/25/2022 1134  ? CO2 23 02/25/2022 1134  ? BUN 14 02/25/2022 1134  ? BUN 11 01/26/2022 0934  ? CREATININE 0.90 02/25/2022 1134  ?  CREATININE 0.80 01/16/2016 1052  ?    ?Component Value Date/Time  ? CALCIUM 10.9 (H) 02/25/2022 1134  ? ALKPHOS 65 02/25/2022 1134  ? AST 14 (L) 02/25/2022 1134  ? ALT 14 02/25/2022 1134  ? BILITOT 0.2 (L) 02/25/2022 1134  ?  ? ?IMPRESSION: ?1. Continued enlargement of the left kidney lower pole mass with ?substantial invasion of the left proximal ureter at the UVJ, ?extension along the perirenal space, extension along the margin of ?the left psoas muscle without intact fat planes, and local tumor ?invasion abutting the serosal margin of the distal descending colon ?without intact fat planes. Tumor involves the left ureter and ?probably the left spermatic vein but I do not see substantial tumor ?thrombus in the left renal vein. The left renal vein does appear ?splayed along the left renal hilum. Delayed enhancement left kidney ?again observed due to prominent hydronephrosis related to ?obstruction from tumor. ?2. Stable pleural based nodules in the right upper lobe with several ?other tiny parenchymal nodules in the lungs, unchanged. ?3. Mild enlargement of index hepatic masses on arterial phase ?images. Also, the hepatic masses show increase conspicuity and ?better definition against the background of hepatic parenchyma. I ?count more (nearly 20) hepatic masses at this time compared to the ?prior exam, with some appearing potentially new although these may ?simply be revealed by increased conspicuity. ?4. Stable enlargement of a left supraclavicular lymph node. ?5. Other imaging findings of potential clinical significance: Aortic ?Atherosclerosis (ICD10-I70.0). Small anterior pericardial effusion. ?Emphysema (ICD10-J43.9). Multilevel lumbar impingement. Moderate ?degenerative hip arthropathy bilaterally. ? ? ?Impression and Plan: ? ?71 year old man with: ?  ?1. Stage IV clear-cell renal cell carcinoma with pulmonary and hepatic involvement diagnosed in December 2021.   ? ? ?He is currently on Cabometyx which she  has been taken intermittently with few complications including nausea and diarrhea.  CT scan obtained on February 25, 2022 showed mild progression of his kidney mass and overall stable disease in his pulmonary nodules.  He did have slight progression also in his hepatic lesion.  Risks and benefits of continuing this treatment versus switching to different salvage therapy options such as axitinib were reviewed.  Complication associated with this medication include hypertension, hand-foot syndrome fatigue among others. ? ?After discussion today, we opted to proceed with axitinib at 5 mg daily and will titrate to a higher dose pending his tolerance. ? ?2.  Weight loss: Related to Cabometyx.  I recommended discontinuation of this medication at this time. ?  ?  ?3.  Hydronephrosis: Due to his tumor although kidney function remains normal. ?  ?  ?4.   Weakness and dehydration: We will arrange for IV fluids in the near future and discontinuation of Cabometyx should help with symptoms. ?  ?5.  Goals of care and prognosis: His disease is incurable and  any treatment is palliative at this time.  His performance status is adequate and continued aggressive measures are recommended. ?  ?6.  Anemia: Related to malignancy and chronic disease.  His hemoglobin is adequate at this time. ? ?7.  Hypercalcemia: His calcium is mildly elevated with corrected calcium is higher at this time.  We will arrange for Zometa in the near future. ? ?8.  Follow-up: In the next 4 weeks to review evaluation. ?  ?30  minutes were dedicated to this encounter.  The time was spent on reviewing laboratory data, disease status update, reviewing imaging studies and treatment choices for the future. ?  ?  ? ?Zola Button, MD ?3/22/20233:20 PM ? ?

## 2022-03-05 ENCOUNTER — Other Ambulatory Visit: Payer: Self-pay

## 2022-03-05 ENCOUNTER — Inpatient Hospital Stay: Payer: Medicare Other

## 2022-03-05 ENCOUNTER — Other Ambulatory Visit: Payer: Self-pay | Admitting: Oncology

## 2022-03-05 DIAGNOSIS — C642 Malignant neoplasm of left kidney, except renal pelvis: Secondary | ICD-10-CM | POA: Diagnosis not present

## 2022-03-05 MED ORDER — SODIUM CHLORIDE 0.9 % IV SOLN: Freq: Once | INTRAVENOUS | Status: AC

## 2022-03-05 MED ORDER — ZOLEDRONIC ACID 4 MG/100ML IV SOLN
4.0000 mg | Freq: Once | INTRAVENOUS | Status: AC
  Administered 2022-03-05: 4 mg via INTRAVENOUS
  Filled 2022-03-05: qty 100

## 2022-03-05 NOTE — Patient Instructions (Signed)
Rehydration, Adult ?Rehydration is the replacement of body fluids, salts, and minerals (electrolytes) that are lost during dehydration. Dehydration is when there is not enough water or other fluids in the body. This happens when you lose more fluids than you take in. Common causes of dehydration include: ?Not drinking enough fluids. This can occur when you are ill or doing activities that require a lot of energy, especially in hot weather. ?Conditions that cause loss of water or other fluids, such as diarrhea, vomiting, sweating, or urinating a lot. ?Other illnesses, such as fever or infection. ?Certain medicines, such as those that remove excess fluid from the body (diuretics). ?Symptoms of mild or moderate dehydration may include thirst, dry lips and mouth, and dizziness. Symptoms of severe dehydration may include increased heart rate, confusion, fainting, and not urinating. ?For severe dehydration, you may need to get fluids through an IV at the hospital. For mild or moderate dehydration, you can usually rehydrate at home by drinking certain fluids as told by your health care provider. ?What are the risks? ?Generally, rehydration is safe. However, taking in too much fluid (overhydration) can be a problem. This is rare. Overhydration can cause an electrolyte imbalance, kidney failure, or a decrease in salt (sodium) levels in the body. ?Supplies needed ?You will need an oral rehydration solution (ORS) if your health care provider tells you to use one. This is a drink to treat dehydration. It can be found in pharmacies and retail stores. ?How to rehydrate ?Fluids ?Follow instructions from your health care provider for rehydration. The kind of fluid and the amount you should drink depend on your condition. In general, you should choose drinks that you prefer. ?If told by your health care provider, drink an ORS. ?Make an ORS by following instructions on the package. ?Start by drinking small amounts, about ? cup (120  mL) every 5-10 minutes. ?Slowly increase how much you drink until you have taken the amount recommended by your health care provider. ?Drink enough clear fluids to keep your urine pale yellow. If you were told to drink an ORS, finish it first, then start slowly drinking other clear fluids. Drink fluids such as: ?Water. This includes sparkling water and flavored water. Drinking only water can lead to having too little sodium in your body (hyponatremia). Follow the advice of your health care provider. ?Water from ice chips you suck on. ?Fruit juice with water you add to it (diluted). ?Sports drinks. ?Hot or cold herbal teas. ?Broth-based soups. ?Milk or milk products. ?Food ?Follow instructions from your health care provider about what to eat while you rehydrate. Your health care provider may recommend that you slowly begin eating regular foods in small amounts. ?Eat foods that contain a healthy balance of electrolytes, such as bananas, oranges, potatoes, tomatoes, and spinach. ?Avoid foods that are greasy or contain a lot of sugar. ?In some cases, you may get nutrition through a feeding tube that is passed through your nose and into your stomach (nasogastric tube, or NG tube). This may be done if you have uncontrolled vomiting or diarrhea. ?Beverages to avoid ?Certain beverages may make dehydration worse. While you rehydrate, avoid drinking alcohol. ?How to tell if you are recovering from dehydration ?You may be recovering from dehydration if: ?You are urinating more often than before you started rehydrating. ?Your urine is pale yellow. ?Your energy level improves. ?You vomit less frequently. ?You have diarrhea less frequently. ?Your appetite improves or returns to normal. ?You feel less dizzy or less light-headed. ?Your  skin tone and color start to look more normal. ?Follow these instructions at home: ?Take over-the-counter and prescription medicines only as told by your health care provider. ?Do not take sodium  tablets. Doing this can lead to having too much sodium in your body (hypernatremia). ?Contact a health care provider if: ?You continue to have symptoms of mild or moderate dehydration, such as: ?Thirst. ?Dry lips. ?Slightly dry mouth. ?Dizziness. ?Dark urine or less urine than normal. ?Muscle cramps. ?You continue to vomit or have diarrhea. ?Get help right away if you: ?Have symptoms of dehydration that get worse. ?Have a fever. ?Have a severe headache. ?Have been vomiting and the following happens: ?Your vomiting gets worse or does not go away. ?Your vomit includes blood or green matter (bile). ?You cannot eat or drink without vomiting. ?Have problems with urination or bowel movements, such as: ?Diarrhea that gets worse or does not go away. ?Blood in your stool (feces). This may cause stool to look black and tarry. ?Not urinating, or urinating only a small amount of very dark urine, within 6-8 hours. ?Have trouble breathing. ?Have symptoms that get worse with treatment. ?These symptoms may represent a serious problem that is an emergency. Do not wait to see if the symptoms will go away. Get medical help right away. Call your local emergency services (911 in the [place] Do not drive yourself to the hospital. ?Summary ?Rehydration is the replacement of body fluids and minerals (electrolytes) that are lost during dehydration. ?Follow instructions from your health care provider for rehydration. The kind of fluid and amount you should drink depend on your condition. ?Slowly increase how much you drink until you have taken the amount recommended by your health care provider. ?Contact your health care provider if you continue to show signs of mild or moderate dehydration. ?This information is not intended to replace advice given to you by your health care provider. Make sure you discuss any questions you have with your health care provider. ?Document Revised: 01/30/2020 Document Reviewed: 12/10/2019 ?Elsevier Patient  Education ? [place]. ? ?Zoledronic Acid Injection (Hypercalcemia, Oncology) ?What is this medication? ?ZOLEDRONIC ACID (ZOE le dron ik AS id) slows calcium loss from bones. It high calcium levels in the blood from some kinds of cancer. It may be used in other people at risk for bone loss. ?This medicine may be used for other purposes; ask your health care provider or pharmacist if you have questions. ?COMMON BRAND NAME(S): Zometa ?What should I tell my care team before I take this medication? ?They need to know if you have any of these conditions: ?cancer ?dehydration ?dental disease ?kidney disease ?liver disease ?low levels of calcium in the blood ?lung or breathing disease (asthma) ?receiving steroids like dexamethasone or prednisone ?an unusual or allergic reaction to zoledronic acid, other medicines, foods, dyes, or preservatives ?pregnant or trying to get pregnant ?breast-feeding ?How should I use this medication? ?This drug is injected into a vein. It is given by a health care provider in a hospital or clinic setting. ?Talk to your health care provider about the use of this drug in children. Special care may be needed. ?Overdosage: If you think you have taken too much of this medicine contact a poison control center or emergency room at once. ?NOTE: This medicine is only for you. Do not share this medicine with others. ?What if I miss a dose? ?Keep appointments for follow-up doses. It is important not to miss your dose. Call your health care provider if  you are unable to keep an appointment. ?What may interact with this medication? ?certain antibiotics given by injection ?NSAIDs, medicines for pain and inflammation, like ibuprofen or naproxen ?some diuretics like bumetanide, furosemide ?teriparatide ?thalidomide ?This list may not describe all possible interactions. Give your health care provider a list of all the medicines, herbs, non-prescription drugs, or dietary supplements you use. Also tell  them if you smoke, drink alcohol, or use illegal drugs. Some items may interact with your medicine. ?What should I watch for while using this medication? ?Visit your health care provider for regular checks on yo

## 2022-03-08 ENCOUNTER — Other Ambulatory Visit (HOSPITAL_COMMUNITY): Payer: Self-pay

## 2022-03-08 ENCOUNTER — Telehealth: Payer: Self-pay

## 2022-03-08 ENCOUNTER — Other Ambulatory Visit: Payer: Self-pay | Admitting: Oncology

## 2022-03-08 ENCOUNTER — Ambulatory Visit: Payer: Medicare Other

## 2022-03-08 DIAGNOSIS — C641 Malignant neoplasm of right kidney, except renal pelvis: Secondary | ICD-10-CM

## 2022-03-08 MED ORDER — AXITINIB 5 MG PO TABS
5.0000 mg | ORAL_TABLET | Freq: Every day | ORAL | 1 refills | Status: DC
  Filled 2022-03-08: qty 30, 30d supply, fill #0

## 2022-03-08 MED ORDER — AXITINIB 5 MG PO TABS: 5.0000 mg | ORAL_TABLET | Freq: Every day | ORAL | 1 refills | Status: DC

## 2022-03-08 NOTE — Telephone Encounter (Signed)
Oral Oncology Patient Advocate Encounter ? ?Prior Authorization for Chad Cantu has been approved.   ? ?PA# [id number] ?Effective dates: 02/06/22 through 03/08/23 ? ?Patient must use [facility] ? ?Oral Oncology Clinic will continue to follow.  ? ?Wynn Maudlin CPHT ?Specialty Pharmacy Patient Advocate ?[place] ?Phone [phone] ?Fax [phone] ?03/08/2022 9:59 AM ? ?

## 2022-03-08 NOTE — Telephone Encounter (Signed)
Oral Oncology Patient Advocate Encounter ?  ?Received notification from [employer] D that prior authorization for Bartholomew Boards is required. ?  ?PA submitted on CoverMyMeds ?Key [id number] ?Status is pending ?  ?Oral Oncology Clinic will continue to follow. ? ?Wynn Maudlin CPHT ?Specialty Pharmacy Patient Advocate ?[place] ?Phone [phone] ?Fax [phone] ?03/08/2022 9:57 AM ? ?  ? ?

## 2022-03-08 NOTE — Telephone Encounter (Signed)
Oral Oncology Pharmacist Encounter ? ?Received new prescription for axitinib (Inlyta) for the treatment of stage IV clear-cell RCC, planned duration until disease progression or unacceptable toxicity. Prescription dose and frequency assessed.  ? ?Labs from 02/25/22 assessed, no interventions needed. ? ?Current medication list in Epic reviewed, DDIs with Inlyta identified: none ? ?Evaluated chart and no patient barriers to medication adherence noted.  ? ?Patient agreement for treatment documented in MD note on 03/03/22. ? ?Prescription has been e-scribed to the [facility] for benefits analysis and approval. ? ?Oral Oncology Clinic will continue to follow for insurance authorization, copayment issues, initial counseling and start date. ? ?Drema Halon, PharmD ?Hematology/Oncology Clinical Pharmacist ?[facility] ?[phone] ?03/08/2022 9:53 AM ? ?

## 2022-03-10 ENCOUNTER — Other Ambulatory Visit (HOSPITAL_COMMUNITY): Payer: Self-pay

## 2022-03-14 ENCOUNTER — Other Ambulatory Visit: Payer: Self-pay | Admitting: Oncology

## 2022-03-15 ENCOUNTER — Encounter: Payer: Self-pay | Admitting: Oncology

## 2022-03-17 NOTE — Telephone Encounter (Signed)
Oral Chemotherapy Pharmacist Encounter ? ?I spoke with patient for overview of: Inlyta (axitinib) for the treatment of previously treated, advanced or metastatic renal cell carcinoma, planned duration until disease progression or unacceptable toxicity.  ? ?Counseled patient on administration, dosing, side effects, monitoring, drug-food interactions, safe handling, storage, and disposal. ? ?Patient will take Inlyta '5mg'$  tablets, 1 tablet by mouth daily, without regard to food, with a glass of water. ? ?Patient instructed to avoid grapefruit and grapefruit juice while on therapy with Inlyta. ? ?Inlyta start date: 03/19/2022 ? ?Adverse effects include but are not limited to: hypertension, hand-foot syndrome, nausea, vomiting, diarrhea, fatigue, dysphonia (hoarseness), and abnormal laboratory values.   ?Patient will obtain anti diarrheal and alert the office of 4 or more loose stools above baseline. ? ?Chad Cantu will be held at least 24 hours prior to surgery and resumed at discretion of treating physician based on wound healing ? ?Reviewed with patient importance of keeping a medication schedule and plan for any missed doses. No barriers to medication adherence identified. ? ?Medication reconciliation performed and medication/allergy list updated. ? ?Ship broker for Chad Cantu has been obtained. ?Patient fills through [place] and medication will arrive on 03/18/2022. ? ?Patient informed the pharmacy will reach out 5-7 days prior to needing next fill of Inlyta to coordinate continued medication acquisition to prevent break in therapy. ? ?All questions answered. ? ?Mr. Chad Cantu voiced understanding and appreciation.  ? ?Medication education handout placed in mail for patient. Patient knows to call the office with questions or concerns. Oral Chemotherapy Clinic phone number provided to patient.  ? ?Drema Halon, PharmD ?Hematology/Oncology Clinical Pharmacist ?[facility] ?[phone] ?03/17/2022   8:22 AM ? ?

## 2022-03-24 ENCOUNTER — Telehealth: Payer: Self-pay

## 2022-03-24 NOTE — Telephone Encounter (Signed)
Chad Cantu called to say that since starting Axitinib, patient  is experiencing dizziness and decrease in appetite "picking at his food". This Probation officer had wife take b/p which was 136/87. Patient drinking "lots of flluid." No other symptoms noted.  ? ?Routed to provider. ?

## 2022-04-02 ENCOUNTER — Other Ambulatory Visit: Payer: Self-pay

## 2022-04-05 ENCOUNTER — Other Ambulatory Visit: Payer: Self-pay

## 2022-04-08 ENCOUNTER — Inpatient Hospital Stay (HOSPITAL_BASED_OUTPATIENT_CLINIC_OR_DEPARTMENT_OTHER): Payer: Medicare Other | Admitting: Oncology

## 2022-04-08 ENCOUNTER — Inpatient Hospital Stay: Payer: Medicare Other | Attending: Oncology

## 2022-04-08 VITALS — BP 103/75 | HR 98 | Temp 97.8°F | Resp 15 | Ht 66.0 in | Wt 143.0 lb

## 2022-04-08 DIAGNOSIS — C7800 Secondary malignant neoplasm of unspecified lung: Secondary | ICD-10-CM | POA: Diagnosis present

## 2022-04-08 DIAGNOSIS — Z79899 Other long term (current) drug therapy: Secondary | ICD-10-CM | POA: Insufficient documentation

## 2022-04-08 DIAGNOSIS — R42 Dizziness and giddiness: Secondary | ICD-10-CM | POA: Diagnosis not present

## 2022-04-08 DIAGNOSIS — N1330 Unspecified hydronephrosis: Secondary | ICD-10-CM | POA: Insufficient documentation

## 2022-04-08 DIAGNOSIS — R21 Rash and other nonspecific skin eruption: Secondary | ICD-10-CM | POA: Insufficient documentation

## 2022-04-08 DIAGNOSIS — E032 Hypothyroidism due to medicaments and other exogenous substances: Secondary | ICD-10-CM

## 2022-04-08 DIAGNOSIS — R630 Anorexia: Secondary | ICD-10-CM | POA: Insufficient documentation

## 2022-04-08 DIAGNOSIS — Z7984 Long term (current) use of oral hypoglycemic drugs: Secondary | ICD-10-CM | POA: Diagnosis not present

## 2022-04-08 DIAGNOSIS — C787 Secondary malignant neoplasm of liver and intrahepatic bile duct: Secondary | ICD-10-CM | POA: Insufficient documentation

## 2022-04-08 DIAGNOSIS — C642 Malignant neoplasm of left kidney, except renal pelvis: Secondary | ICD-10-CM | POA: Diagnosis present

## 2022-04-08 DIAGNOSIS — C649 Malignant neoplasm of unspecified kidney, except renal pelvis: Secondary | ICD-10-CM

## 2022-04-08 DIAGNOSIS — D649 Anemia, unspecified: Secondary | ICD-10-CM | POA: Diagnosis not present

## 2022-04-08 DIAGNOSIS — C641 Malignant neoplasm of right kidney, except renal pelvis: Secondary | ICD-10-CM | POA: Diagnosis not present

## 2022-04-08 LAB — CMP (CANCER CENTER ONLY)
ALT: 11 U/L (ref 0–44)
AST: 11 U/L — ABNORMAL LOW (ref 15–41)
Albumin: 2.7 g/dL — ABNORMAL LOW (ref 3.5–5.0)
Alkaline Phosphatase: 57 U/L (ref 38–126)
Anion gap: 9 (ref 5–15)
BUN: 9 mg/dL (ref 8–23)
CO2: 23 mmol/L (ref 22–32)
Calcium: 8.3 mg/dL — ABNORMAL LOW (ref 8.9–10.3)
Chloride: 105 mmol/L (ref 98–111)
Creatinine: 0.81 mg/dL (ref 0.61–1.24)
GFR, Estimated: >60 mL/min (ref >60)
Glucose, Bld: 94 mg/dL (ref 70–99)
Potassium: 3.6 mmol/L (ref 3.5–5.1)
Sodium: 137 mmol/L (ref 135–145)
Total Bilirubin: 0.2 mg/dL — ABNORMAL LOW (ref 0.3–1.2)
Total Protein: 8.4 g/dL — ABNORMAL HIGH (ref 6.5–8.1)

## 2022-04-08 LAB — CBC WITH DIFFERENTIAL (CANCER CENTER ONLY)
Abs Immature Granulocytes: 0.03 10*3/uL (ref 0.00–0.07)
Basophils Absolute: 0.1 10*3/uL (ref 0.0–0.1)
Basophils Relative: 1 %
Eosinophils Absolute: 0 10*3/uL (ref 0.0–0.5)
Eosinophils Relative: 0 %
HCT: 30.1 % — ABNORMAL LOW (ref 39.0–52.0)
Hemoglobin: 8.8 g/dL — ABNORMAL LOW (ref 13.0–17.0)
Immature Granulocytes: 0 %
Lymphocytes Relative: 26 %
Lymphs Abs: 3 10*3/uL (ref 0.7–4.0)
MCH: 24.6 pg — ABNORMAL LOW (ref 26.0–34.0)
MCHC: 29.2 g/dL — ABNORMAL LOW (ref 30.0–36.0)
MCV: 84.1 fL (ref 80.0–100.0)
Monocytes Absolute: 1.5 10*3/uL — ABNORMAL HIGH (ref 0.1–1.0)
Monocytes Relative: 14 %
Neutro Abs: 6.7 10*3/uL (ref 1.7–7.7)
Neutrophils Relative %: 59 %
Platelet Count: 642 10*3/uL — ABNORMAL HIGH (ref 150–400)
RBC: 3.58 MIL/uL — ABNORMAL LOW (ref 4.22–5.81)
RDW: 19.6 % — ABNORMAL HIGH (ref 11.5–15.5)
WBC Count: 11.4 10*3/uL — ABNORMAL HIGH (ref 4.0–10.5)
nRBC: 0 % (ref 0.0–0.2)

## 2022-04-08 LAB — TSH: TSH: 2.609 u[IU]/mL (ref 0.350–4.500)

## 2022-04-08 NOTE — Progress Notes (Signed)
Hematology and Oncology Follow Up Visit ? ?Kaydan Wong ?[id number] ?Jul 05, 1951 71 y.o. ?04/08/2022 1:28 PM ?[name], MDJohnson, [name], MD  ? 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 ? ? ?Prior Therapy: ? ?He is status post ultrasound biopsy of the hepatic lesion on December 01, 2020.  The final pathology confirmed the presence of metastatic clear-cell renal cell carcinoma. ? ?Ipilimumab 1 mg/kg and nivolumab 3 mg/kg therapy is started on December 23, 2019.  He completed 3 cycles of therapy with progression of disease. ? ?Cabometyx 40 mg daily started on March 04, 2021.  Therapy switched to 20 mg daily starting in July 2022.  Therapy discontinued in March 2023 due to progression of disease. ? ?Current therapy: Inlyta 5 mg daily started on March 17, 2022. ? ?Interim History: Mr. Chad Cantu is here for repeat evaluation.  Since last visit, he reports a few complaints related to [place].  He has reported dizziness, weakness and decline in his appetite.  He has lost weight close to 5 pounds.  He has reported that his dizziness and lightheadedness has been significantly impacting his mobility at this time. ? ? ? ? ?Medications: Updated on review. ?Current Outpatient Medications  ?Medication Sig Dispense Refill  ? atorvastatin (LIPITOR) 20 MG tablet TAKE 1 TABLET BY MOUTH DAILY. 90 tablet 2  ? acetaminophen (TYLENOL) 325 MG tablet Take 2 tablets (650 mg total) by mouth every 6 (six) hours as needed for mild pain (or Fever >/= 101).    ? Ascorbic Acid (VITAMIN C PO) Take by mouth.    ? axitinib (INLYTA) 5 MG tablet Take 1 tablet (5 mg total) by mouth daily. 30 tablet 1  ? CABOMETYX 20 MG tablet TAKE 1 TABLET DAILY. TAKE ON AN EMPTY STOMACH, 1 HOUR BEFORE OR 2 HOURS AFTER MEALS. (Patient not taking: Reported on 03/17/2022) 30 tablet 0  ? ferrous sulfate 325 (65 FE) MG tablet TAKE 1 TABLET (325 MG TOTAL) BY MOUTH IN THE  MORNING AND AT BEDTIME. 60 tablet 3  ? lisinopril-hydrochlorothiazide (ZESTORETIC) 10-12.5 MG tablet TAKE 1 TABLET BY MOUTH DAILY. 90 tablet 2  ? megestrol (MEGACE) 40 MG/ML suspension SHAKE LIQUID AND TAKE 10 ML BY MOUTH EVERY DAY 900 mL 1  ? metFORMIN (GLUCOPHAGE) 500 MG tablet TAKE 1 TABLET (500 MG TOTAL) BY MOUTH DAILY WITH BREAKFAST. 30 tablet 3  ? mirtazapine (REMERON) 7.5 MG tablet Take 1 tablet (7.5 mg total) by mouth at bedtime. 30 tablet 3  ? ?No current facility-administered medications for this visit.  ? ? ? ?Allergies: No Known Allergies ? ? ?Physical Exam: ?Blood pressure 103/75, pulse 98, temperature 97.8 ?F (36.6 ?C), temperature source Temporal, resp. rate 15, height '5\' 6"'$  (1.676 m), weight 143 lb (64.9 kg), SpO2 99 %. ? ? ? ?ECOG: 1 ? ? ? ? ?General appearance: Alert, awake without any distress. ?Head: Atraumatic without abnormalities ?Oropharynx: Without any thrush or ulcers. ?Eyes: No scleral icterus. ?Lymph nodes: No lymphadenopathy noted in the cervical, supraclavicular, or axillary nodes ?Heart:regular rate and rhythm, without any murmurs or gallops.   ?Lung: Clear to auscultation without any rhonchi, wheezes or dullness to percussion. ?Abdomin: Soft, nontender without any shifting dullness or ascites. ?Musculoskeletal: No clubbing or cyanosis. ?Neurological: No motor or sensory deficits. ?Skin: No rashes or lesions. ? ? ? ? ? ? ? ? ? ? ? ? ? ? ?Lab Results: ?Lab Results  ?Component Value Date  ? WBC  12.6 (H) 02/25/2022  ? HGB 8.3 (L) 02/25/2022  ? HCT 27.4 (L) 02/25/2022  ? MCV 86.2 02/25/2022  ? PLT 643 (H) 02/25/2022  ? ?  Chemistry   ?   ?Component Value Date/Time  ? NA 136 02/25/2022 1134  ? NA 139 01/26/2022 0934  ? K 3.8 02/25/2022 1134  ? CL 101 02/25/2022 1134  ? CO2 23 02/25/2022 1134  ? BUN 14 02/25/2022 1134  ? BUN 11 01/26/2022 0934  ? CREATININE 0.90 02/25/2022 1134  ? CREATININE 0.80 01/16/2016 1052  ?    ?Component Value Date/Time  ? CALCIUM 10.9 (H) 02/25/2022 1134  ? ALKPHOS  65 02/25/2022 1134  ? AST 14 (L) 02/25/2022 1134  ? ALT 14 02/25/2022 1134  ? BILITOT 0.2 (L) 02/25/2022 1134  ?  ? ? ? ? ?Impression and Plan: ? ?70 year old man with: ?  ?1.  Kidney cancer diagnosed in December 2021.  He was found to have stage IV clear-cell renal cell carcinoma with pulmonary and hepatic involvement. ? ?He is currently on Inlyta 5 mg daily for the last 3 weeks and has tolerated it poorly.  He has issues with dizziness and anorexia as well as skin rash.  At this time I recommended treatment break for the next 7 to 14 days.  If his symptoms resolve we will resume his medication at a lower dose tentatively at 1 mg daily.  If he continues to struggle with the symptoms switching to a different agent would be considered including Lenvima as a potential option. ? ? ?2.  Weight loss: Related to ensure in light.  We have discussed strategies to boost his appetite and anticipate improvement with the discontinuation of Inlyta. ?  ?  ?3.  Hydronephrosis: His kidney function remains normal we will continue to monitor. ?  ?  ?4.   Dizziness and weakness: Likely related to Inlyta although hypercalcemia could be contributing.  We will recheck his labs today. ?  ?5.  Goals of care and prognosis: Therapy remains palliative though advancements are warranted given his reasonable performance status. ?  ?6.  Anemia: His hemoglobin today is 8.8 and does not require any transfusion. ? ?7.  Hypercalcemia: He has received Zometa in March 2023 and will continue to monitor and repeat as needed. ? ?8.  Follow-up: In the next 4 to 6 weeks for repeat follow-up. ?  ?30  minutes were spent on this visit.  The time was dedicated to reviewing laboratory data, disease status update and outlining future plan of care discussion. ?  ?  ? ?Zola Button, MD ?4/27/20231:28 PM ? ?

## 2022-04-19 ENCOUNTER — Telehealth: Payer: Self-pay | Admitting: *Deleted

## 2022-04-19 NOTE — Telephone Encounter (Signed)
Notified to let call us on Thursday with an update. No new med dose at this time. He is not taking the Inlyta at this time ?

## 2022-04-19 NOTE — Telephone Encounter (Signed)
Chad Cantu states Chad Cantu is "still feeling dizzy, not eating/horrible appetite". Wants to know when Dr Alen Blew plans to start the new med/lower dose? ?

## 2022-04-23 ENCOUNTER — Telehealth: Payer: Self-pay | Admitting: *Deleted

## 2022-04-23 NOTE — Telephone Encounter (Signed)
Mrs Scearce states Krish is not doing any better. Is still weak and not eating. Continues to take Megace, is no longer working. Inltya still on hold. ?

## 2022-04-23 NOTE — Telephone Encounter (Signed)
Notified no changes for now. Chad Cantu is drinking a lot of water and gatorade. Does not feel like he needs IVF. ?

## 2022-04-28 ENCOUNTER — Other Ambulatory Visit: Payer: Self-pay | Admitting: Pharmacist

## 2022-04-28 MED ORDER — ATORVASTATIN CALCIUM 20 MG PO TABS
ORAL_TABLET | Freq: Every day | ORAL | 0 refills | Status: DC
Start: 2022-04-28 — End: 2022-05-26

## 2022-04-29 ENCOUNTER — Other Ambulatory Visit: Payer: Self-pay

## 2022-05-04 ENCOUNTER — Other Ambulatory Visit: Payer: Self-pay | Admitting: *Deleted

## 2022-05-04 ENCOUNTER — Other Ambulatory Visit: Payer: Self-pay

## 2022-05-04 ENCOUNTER — Telehealth: Payer: Self-pay | Admitting: *Deleted

## 2022-05-04 DIAGNOSIS — C641 Malignant neoplasm of right kidney, except renal pelvis: Secondary | ICD-10-CM

## 2022-05-04 MED ORDER — AXITINIB 1 MG PO TABS: 1.0000 mg | ORAL_TABLET | Freq: Every day | ORAL | 0 refills | Status: DC

## 2022-05-04 NOTE — Telephone Encounter (Signed)
Returned PC to patient's wife, [name] - informed her Dr. Alen Blew wants the patient to restart Inlyta, one mg daily.  Prescription has been sent to [facility].  She verbalizes understanding.

## 2022-05-04 NOTE — Telephone Encounter (Signed)
-----   Message from [name], MD sent at 05/04/2022  2:06 PM EDT ----- Regarding: RE: Bartholomew Boards He can resume it.  1 mg tablet daily. Thanks ----- Message ----- From: Rolene Course, RN Sent: 05/04/2022   2:05 PM EDT To: [name], MD Subject: Bartholomew Boards                                         Mr Lave's wife called & said he is doing better, is eating more & is not as dizzy.  She is asking about resuming the Inlyta at 1 mg as previously discussed.  Please advise.  Thanks, Bethena Roys

## 2022-05-12 ENCOUNTER — Telehealth: Payer: Self-pay | Admitting: Oncology

## 2022-05-12 NOTE — Telephone Encounter (Signed)
Called patient regarding upcoming June appointments, patient is notified.  

## 2022-05-18 ENCOUNTER — Emergency Department (HOSPITAL_COMMUNITY)
Admission: EM | Admit: 2022-05-18 | Discharge: 2022-05-19 | Disposition: A | Discharge status: Home / Self Care | Payer: Medicare Other | Attending: Emergency Medicine | Admitting: Emergency Medicine

## 2022-05-18 ENCOUNTER — Other Ambulatory Visit: Payer: Self-pay

## 2022-05-18 DIAGNOSIS — R531 Weakness: Secondary | ICD-10-CM | POA: Diagnosis present

## 2022-05-18 DIAGNOSIS — R296 Repeated falls: Secondary | ICD-10-CM | POA: Diagnosis present

## 2022-05-18 DIAGNOSIS — C801 Malignant (primary) neoplasm, unspecified: Secondary | ICD-10-CM | POA: Insufficient documentation

## 2022-05-18 DIAGNOSIS — R197 Diarrhea, unspecified: Secondary | ICD-10-CM | POA: Diagnosis not present

## 2022-05-18 DIAGNOSIS — I10 Essential (primary) hypertension: Secondary | ICD-10-CM | POA: Insufficient documentation

## 2022-05-18 DIAGNOSIS — E119 Type 2 diabetes mellitus without complications: Secondary | ICD-10-CM | POA: Diagnosis not present

## 2022-05-18 DIAGNOSIS — R0602 Shortness of breath: Secondary | ICD-10-CM | POA: Diagnosis present

## 2022-05-18 DIAGNOSIS — Z87891 Personal history of nicotine dependence: Secondary | ICD-10-CM | POA: Diagnosis not present

## 2022-05-18 DIAGNOSIS — R5383 Other fatigue: Secondary | ICD-10-CM | POA: Insufficient documentation

## 2022-05-18 NOTE — ED Triage Notes (Signed)
BIBA from home for weakness, fatigue, not eating/drinking, has stage 4 kidney, lung, and liver CA dx a year ago, dark smelly urine

## 2022-05-19 ENCOUNTER — Emergency Department (HOSPITAL_COMMUNITY): Payer: Medicare Other

## 2022-05-19 DIAGNOSIS — R531 Weakness: Secondary | ICD-10-CM | POA: Diagnosis not present

## 2022-05-19 LAB — URINALYSIS, ROUTINE W REFLEX MICROSCOPIC
Bilirubin Urine: NEGATIVE
Glucose, UA: NEGATIVE mg/dL
Ketones, ur: NEGATIVE mg/dL
Leukocytes,Ua: NEGATIVE
Nitrite: NEGATIVE
Protein, ur: 30 mg/dL — AB
Specific Gravity, Urine: 1.006 (ref 1.005–1.030)
pH: 6 (ref 5.0–8.0)

## 2022-05-19 LAB — COMPREHENSIVE METABOLIC PANEL
ALT: 11 U/L (ref 0–44)
AST: 13 U/L — ABNORMAL LOW (ref 15–41)
Albumin: 2 g/dL — ABNORMAL LOW (ref 3.5–5.0)
Alkaline Phosphatase: 76 U/L (ref 38–126)
Anion gap: 11 (ref 5–15)
BUN: 8 mg/dL (ref 8–23)
CO2: 19 mmol/L — ABNORMAL LOW (ref 22–32)
Calcium: 10.5 mg/dL — ABNORMAL HIGH (ref 8.9–10.3)
Chloride: 109 mmol/L (ref 98–111)
Creatinine, Ser: 1.06 mg/dL (ref 0.61–1.24)
GFR, Estimated: >60 mL/min (ref >60)
Glucose, Bld: 130 mg/dL — ABNORMAL HIGH (ref 70–99)
Potassium: 3.1 mmol/L — ABNORMAL LOW (ref 3.5–5.1)
Sodium: 139 mmol/L (ref 135–145)
Total Bilirubin: 0.5 mg/dL (ref 0.3–1.2)
Total Protein: 8.9 g/dL — ABNORMAL HIGH (ref 6.5–8.1)

## 2022-05-19 LAB — PROTIME-INR
INR: 1.2 (ref 0.8–1.2)
Prothrombin Time: 15.1 seconds (ref 11.4–15.2)

## 2022-05-19 LAB — CBC
HCT: 25.6 % — ABNORMAL LOW (ref 39.0–52.0)
Hemoglobin: 7.5 g/dL — ABNORMAL LOW (ref 13.0–17.0)
MCH: 23.6 pg — ABNORMAL LOW (ref 26.0–34.0)
MCHC: 29.3 g/dL — ABNORMAL LOW (ref 30.0–36.0)
MCV: 80.5 fL (ref 80.0–100.0)
Platelets: 661 10*3/uL — ABNORMAL HIGH (ref 150–400)
RBC: 3.18 MIL/uL — ABNORMAL LOW (ref 4.22–5.81)
RDW: 19.9 % — ABNORMAL HIGH (ref 11.5–15.5)
WBC: 15.1 10*3/uL — ABNORMAL HIGH (ref 4.0–10.5)
nRBC: 0 % (ref 0.0–0.2)

## 2022-05-19 LAB — LACTIC ACID, PLASMA: Lactic Acid, Venous: 1.5 mmol/L (ref 0.5–1.9)

## 2022-05-19 LAB — TROPONIN I (HIGH SENSITIVITY)
Troponin I (High Sensitivity): 8 ng/L (ref <18)
Troponin I (High Sensitivity): 9 ng/L (ref <18)

## 2022-05-19 MED ORDER — POTASSIUM CHLORIDE CRYS ER 20 MEQ PO TBCR
40.0000 meq | EXTENDED_RELEASE_TABLET | Freq: Once | ORAL | Status: AC
  Administered 2022-05-19: 40 meq via ORAL
  Filled 2022-05-19: qty 2

## 2022-05-19 MED ORDER — SODIUM CHLORIDE 0.9 % IV BOLUS
1000.0000 mL | Freq: Once | INTRAVENOUS | Status: AC
  Administered 2022-05-19: 1000 mL via INTRAVENOUS

## 2022-05-19 NOTE — Discharge Instructions (Addendum)
You were evaluated in the Emergency Department and after careful evaluation, we did not find any emergent condition requiring admission or further testing in the hospital.  Your exam/testing today is overall reassuring.  Recommend discussing symptoms with your primary care doctor.  Also recommend reaching out to hospice of the [place] to see if they can provide additional assistance.  They can be reached at [phone]  Please return to the Emergency Department if you experience any worsening of your condition.   Thank you for allowing [place] to be a part of your care.

## 2022-05-19 NOTE — ED Provider Notes (Signed)
[facility] Emergency Department Provider Note MRN:  [id number]  Arrival date & time: 05/19/22     Chief Complaint   Fatigue and Weakness   History of Present Illness   Chad Cantu is a 71 y.o. year-old male with a history of hypertension, diabetes, metastatic cancer presenting to the ED with chief complaint of fatigue and weakness.  A few weeks of fatigue, weakness, progressively worsening.  Now having frequent falls, multiple falls today.  Persistent diarrhea during this time as well.  Nausea but no vomiting.  No fever.  Family thinks he has hit his head.  No neck pain, no chest pain.  Had some shortness of breath earlier today.  No abdominal pain.  Review of Systems  A thorough review of systems was obtained and all systems are negative except as noted in the HPI and PMH.   Patient's Health History    Past Medical History:  Diagnosis Date   Cancer ([place])    Diabetes mellitus without complication ([place])    ETOH abuse    Hyperlipidemia    Hypertension    Substance abuse ([place])    ETOH, cocaine.  Remission    Past Surgical History:  Procedure Laterality Date   COLONOSCOPY  5 years ago?   at [facility] maybe per pt,?unsure results   LAPAROSCOPIC GASTROTOMY W/ REPAIR OF ULCER      Family History  Problem Relation Age of Onset   Diabetes Mother    Colon cancer Neg Hx    Colon polyps Neg Hx    Esophageal cancer Neg Hx    Rectal cancer Neg Hx    Stomach cancer Neg Hx     Social History   Socioeconomic History   Marital status: Married    Spouse name: Not on file   Number of children: Not on file   Years of education: Not on file   Highest education level: Not on file  Occupational History   Occupation: security guard  Tobacco Use   Smoking status: Former    Years: 55.00    Types: Cigarettes   Smokeless tobacco: Never  Vaping Use   Vaping Use: Never used  Substance and Sexual Activity   Alcohol use: Not Currently    Alcohol/week: 12.0  standard drinks    Types: 12 Cans of beer per week   Drug use: Not Currently    Types: Marijuana    Comment: marijuana maybe 2 months ago, remote h/o cocaine use   Sexual activity: Yes  Other Topics Concern   Not on file  Social History Narrative   Not on file   Social Determinants of Health   Financial Resource Strain: Not on file  Food Insecurity: Not on file  Transportation Needs: Not on file  Physical Activity: Not on file  Stress: Not on file  Social Connections: Not on file  Intimate Partner Violence: Not on file     Physical Exam   Vitals:   05/19/22 0230 05/19/22 0300  BP: (!) 123/91 130/79  Pulse: (!) 105 84  Resp: (!) 25 20  Temp:    SpO2: 100% 99%    CONSTITUTIONAL: Well-appearing, NAD NEURO/PSYCH:  Alert and oriented to name and place, moves all extremities EYES:  eyes equal and reactive ENT/NECK:  no LAD, no JVD CARDIO: Regular rate, well-perfused, normal S1 and S2 PULM:  CTAB no wheezing or rhonchi GI/GU:  non-distended, non-tender MSK/SPINE:  No gross deformities, no edema SKIN:  no rash, atraumatic   *Additional  and/or pertinent findings included in MDM below  Diagnostic and Interventional Summary    EKG Interpretation  Date/Time:  Wednesday May 19 2022 00:09:39 EDT Ventricular Rate:  97 PR Interval:  141 QRS Duration: 84 QT Interval:  259 QTC Calculation: 329 R Axis:   -10 Text Interpretation: Sinus tachycardia Multiple ventricular premature complexes Abnormal R-wave progression, early transition Borderline T wave abnormalities Confirmed by Gerlene Fee [phone]) on 05/19/2022 12:26:29 AM       Labs Reviewed  CBC - Abnormal; Notable for the following components:      Result Value   WBC 15.1 (*)    RBC 3.18 (*)    Hemoglobin 7.5 (*)    HCT 25.6 (*)    MCH 23.6 (*)    MCHC 29.3 (*)    RDW 19.9 (*)    Platelets 661 (*)    All other components within normal limits  COMPREHENSIVE METABOLIC PANEL - Abnormal; Notable for the following  components:   Potassium 3.1 (*)    CO2 19 (*)    Glucose, Bld 130 (*)    Calcium 10.5 (*)    Total Protein 8.9 (*)    Albumin 2.0 (*)    AST 13 (*)    All other components within normal limits  URINALYSIS, ROUTINE W REFLEX MICROSCOPIC - Abnormal; Notable for the following components:   Hgb urine dipstick SMALL (*)    Protein, ur 30 (*)    Bacteria, UA RARE (*)    All other components within normal limits  CULTURE, BLOOD (ROUTINE X 2)  CULTURE, BLOOD (ROUTINE X 2)  LACTIC ACID, PLASMA  PROTIME-INR  TROPONIN I (HIGH SENSITIVITY)  TROPONIN I (HIGH SENSITIVITY)    CT HEAD WO CONTRAST (5MM)  Final Result    DG Chest Port 1 View  Final Result      Medications  sodium chloride 0.9 % bolus 1,000 mL (0 mLs Intravenous Stopped 05/19/22 0235)  potassium chloride SA (KLOR-CON M) CR tablet 40 mEq (40 mEq Oral Given 05/19/22 0305)     Procedures  /  Critical Care Procedures  ED Course and Medical Decision Making  Initial Impression and Ddx Weakness, fatigue, frequent falls, diarrhea.  Differential diagnosis includes dehydration, acute kidney injury, electrolyte disturbance, subdural hematoma, UTI.  Awaiting work-up.  Past medical/surgical history that increases complexity of ED encounter: Advanced stage IV cancer  Interpretation of Diagnostics I personally reviewed the EKG and my interpretation is as follows: Some PVCs but otherwise sinus rhythm without significant change from prior  Labs are reassuring with no significant change to blood count or electrolytes, somewhat of a downtrending hemoglobin but similar to prior values.  No bleeding per patient and family.  Patient Reassessment and Ultimate Disposition/Management     Patient feeling well, vital signs normal, no symptoms at this time.  We discussed the overall reassuring work-up.  Given the frequent falls, I offered to have the patient stay until the morning hours to be evaluated by case management/social work/physical therapy  to see if he could benefit from other home health services or placement.  Patient prefers to go home at this time.  Family is agreeable to take him home and care for him.  We will provide resources for family.  Patient management required discussion with the following services or consulting groups:  None  Complexity of Problems Addressed Acute illness or injury that poses threat of life of bodily function  Additional Data Reviewed and Analyzed Further history obtained from: Further history from  spouse/family member  Additional Factors Impacting ED Encounter Risk Consideration of hospitalization  Barth Kirks. [place] Small, [place] [email or web address]  Final Clinical Impressions(s) / ED Diagnoses     ICD-10-CM   1. Weakness  R53.1       ED Discharge Orders     None        Discharge Instructions Discussed with and Provided to Patient:     Discharge Instructions      You were evaluated in the Emergency Department and after careful evaluation, we did not find any emergent condition requiring admission or further testing in the hospital.  Your exam/testing today is overall reassuring.  Recommend discussing symptoms with your primary care doctor.  Also recommend reaching out to hospice of the [place] to see if they can provide additional assistance.  They can be reached at [phone]  Please return to the Emergency Department if you experience any worsening of your condition.   Thank you for allowing [place] to be a part of your care.       Maudie Flakes, MD 05/19/22 [phone]

## 2022-05-19 NOTE — ED Notes (Signed)
UA Culture sent down if needed.

## 2022-05-19 NOTE — ED Notes (Signed)
I provided reinforced discharge education based off of discharge instructions. Pt acknowledged and understood my education. Pt had no further questions/concerns for provider/myself.  °

## 2022-05-19 NOTE — ED Notes (Signed)
Advised the pt the need for urine sample, provided urinal.

## 2022-05-19 NOTE — ED Notes (Signed)
Pt still advises being unable to void, will re-check within the hour, advised the use of I&O cath if needed.

## 2022-05-20 ENCOUNTER — Telehealth: Payer: Self-pay | Admitting: *Deleted

## 2022-05-20 ENCOUNTER — Telehealth (HOSPITAL_BASED_OUTPATIENT_CLINIC_OR_DEPARTMENT_OTHER): Payer: Self-pay | Admitting: Emergency Medicine

## 2022-05-20 LAB — BLOOD CULTURE ID PANEL (REFLEXED) - BCID2

## 2022-05-20 NOTE — ED Provider Notes (Signed)
Blood culture has come back positive with Staph epidermidis in 1 bottle, specimen has methicillin resistance.  This is most likely a contaminant.  Review of the chart does not indicate a presentation suggestive of bacteremia.  We will contact him and recommend return visit if he is having any problems.   Delora Fuel, MD 74/12/87 [phone]

## 2022-05-20 NOTE — Telephone Encounter (Signed)
Instructed to hold Inlyta until he sees Dr Alen Blew on Tuesday. Will go back to the ED in needed

## 2022-05-20 NOTE — Telephone Encounter (Signed)
Wife called and says Chad Cantu is still not eating and losing weight. He went to ED via ambulance on Tuesday. States the ED called her today and said he has some type of infection. (See blood cultures) She says he did not eat anything yesterday. Wants to know what to do.

## 2022-05-22 LAB — CULTURE, BLOOD (ROUTINE X 2)

## 2022-05-23 ENCOUNTER — Telehealth (HOSPITAL_BASED_OUTPATIENT_CLINIC_OR_DEPARTMENT_OTHER): Payer: Self-pay | Admitting: *Deleted

## 2022-05-23 ENCOUNTER — Other Ambulatory Visit: Payer: Self-pay | Admitting: Oncology

## 2022-05-23 DIAGNOSIS — C641 Malignant neoplasm of right kidney, except renal pelvis: Secondary | ICD-10-CM

## 2022-05-23 NOTE — Telephone Encounter (Signed)
Post ED Visit - Positive Culture Follow-up  Culture report reviewed by antimicrobial stewardship pharmacist: [place] Team '[]'$  Elenor Quinones, Pharm.D. '[]'$  Heide Guile, Pharm.D., BCPS AQ-ID '[]'$  Parks Neptune, Pharm.D., BCPS '[]'$  Alycia Rossetti, Pharm.D., BCPS '[]'$  [place], Pharm.D., BCPS, AAHIVP '[]'$  Legrand Como, Pharm.D., BCPS, AAHIVP '[]'$  Salome Arnt, PharmD, BCPS '[]'$  Johnnette Gourd, PharmD, BCPS '[]'$  Hughes Better, PharmD, BCPS '[]'$  Leeroy Cha, PharmD '[]'$  Laqueta [place], PharmD, BCPS '[]'$  Albertina Parr, PharmD  [place] Team '[x]'$  Eilene Ghazi PharmD '[]'$  [place] Spar, PharmD '[]'$  Royetta Asal, PharmD '[]'$  Graylin Shiver, Rph '[]'$  Rema Fendt) Glennon Mac, PharmD '[]'$  Arlyn Dunning, PharmD '[]'$  Netta Cedars, PharmD '[]'$  Dia Sitter, PharmD '[]'$  Leone Haven, PharmD '[]'$  Gretta Arab, PharmD '[]'$  Theodis Shove, PharmD '[]'$  Peggyann Juba, PharmD '[]'$  Reuel Boom, PharmD   Positive blood culture Contaminant. No further patient follow-up is required at this time.  Rosie Fate 05/23/2022, 8:20 AM

## 2022-05-24 LAB — CULTURE, BLOOD (ROUTINE X 2)
Culture: NO GROWTH
Special Requests: ADEQUATE

## 2022-05-25 ENCOUNTER — Inpatient Hospital Stay (HOSPITAL_BASED_OUTPATIENT_CLINIC_OR_DEPARTMENT_OTHER): Payer: Medicare Other | Admitting: Oncology

## 2022-05-25 ENCOUNTER — Other Ambulatory Visit: Payer: Self-pay

## 2022-05-25 ENCOUNTER — Encounter (HOSPITAL_COMMUNITY): Payer: Self-pay | Admitting: Emergency Medicine

## 2022-05-25 ENCOUNTER — Inpatient Hospital Stay: Payer: Medicare Other | Attending: Oncology

## 2022-05-25 ENCOUNTER — Emergency Department (HOSPITAL_COMMUNITY): Payer: Medicare Other

## 2022-05-25 ENCOUNTER — Inpatient Hospital Stay (HOSPITAL_COMMUNITY)
Admission: EM | Admit: 2022-05-25 | Discharge: 2022-05-26 | DRG: 641 | Disposition: A | Discharge status: Hospice | Payer: Medicare Other | Attending: Family Medicine | Admitting: Family Medicine

## 2022-05-25 VITALS — BP 100/79 | HR 115 | Temp 98.1°F | Resp 17

## 2022-05-25 DIAGNOSIS — Z66 Do not resuscitate: Secondary | ICD-10-CM

## 2022-05-25 DIAGNOSIS — I10 Essential (primary) hypertension: Secondary | ICD-10-CM | POA: Diagnosis present

## 2022-05-25 DIAGNOSIS — Z87891 Personal history of nicotine dependence: Secondary | ICD-10-CM | POA: Diagnosis not present

## 2022-05-25 DIAGNOSIS — Z7984 Long term (current) use of oral hypoglycemic drugs: Secondary | ICD-10-CM

## 2022-05-25 DIAGNOSIS — D649 Anemia, unspecified: Secondary | ICD-10-CM | POA: Diagnosis not present

## 2022-05-25 DIAGNOSIS — Z7189 Other specified counseling: Secondary | ICD-10-CM

## 2022-05-25 DIAGNOSIS — C7911 Secondary malignant neoplasm of bladder: Secondary | ICD-10-CM | POA: Diagnosis present

## 2022-05-25 DIAGNOSIS — Z833 Family history of diabetes mellitus: Secondary | ICD-10-CM

## 2022-05-25 DIAGNOSIS — C787 Secondary malignant neoplasm of liver and intrahepatic bile duct: Secondary | ICD-10-CM | POA: Diagnosis present

## 2022-05-25 DIAGNOSIS — Z515 Encounter for palliative care: Secondary | ICD-10-CM | POA: Diagnosis not present

## 2022-05-25 DIAGNOSIS — C649 Malignant neoplasm of unspecified kidney, except renal pelvis: Secondary | ICD-10-CM | POA: Diagnosis present

## 2022-05-25 DIAGNOSIS — C7800 Secondary malignant neoplasm of unspecified lung: Secondary | ICD-10-CM | POA: Diagnosis present

## 2022-05-25 DIAGNOSIS — E785 Hyperlipidemia, unspecified: Secondary | ICD-10-CM | POA: Diagnosis present

## 2022-05-25 DIAGNOSIS — E119 Type 2 diabetes mellitus without complications: Secondary | ICD-10-CM | POA: Diagnosis present

## 2022-05-25 DIAGNOSIS — Z79899 Other long term (current) drug therapy: Secondary | ICD-10-CM

## 2022-05-25 DIAGNOSIS — E860 Dehydration: Secondary | ICD-10-CM | POA: Diagnosis present

## 2022-05-25 DIAGNOSIS — Z20822 Contact with and (suspected) exposure to covid-19: Secondary | ICD-10-CM | POA: Diagnosis present

## 2022-05-25 DIAGNOSIS — C641 Malignant neoplasm of right kidney, except renal pelvis: Secondary | ICD-10-CM | POA: Diagnosis not present

## 2022-05-25 DIAGNOSIS — R627 Adult failure to thrive: Secondary | ICD-10-CM | POA: Diagnosis present

## 2022-05-25 DIAGNOSIS — E032 Hypothyroidism due to medicaments and other exogenous substances: Secondary | ICD-10-CM

## 2022-05-25 DIAGNOSIS — R531 Weakness: Secondary | ICD-10-CM

## 2022-05-25 DIAGNOSIS — R5383 Other fatigue: Secondary | ICD-10-CM

## 2022-05-25 LAB — URINALYSIS, ROUTINE W REFLEX MICROSCOPIC
Bilirubin Urine: NEGATIVE
Glucose, UA: NEGATIVE mg/dL
Hgb urine dipstick: NEGATIVE
Ketones, ur: NEGATIVE mg/dL
Leukocytes,Ua: NEGATIVE
Nitrite: NEGATIVE
Protein, ur: NEGATIVE mg/dL
Specific Gravity, Urine: 1.029 (ref 1.005–1.030)
pH: 7 (ref 5.0–8.0)

## 2022-05-25 LAB — CBC WITH DIFFERENTIAL/PLATELET
Abs Immature Granulocytes: 0.06 10*3/uL (ref 0.00–0.07)
Basophils Absolute: 0.1 10*3/uL (ref 0.0–0.1)
Basophils Relative: 1 %
Eosinophils Absolute: 0 10*3/uL (ref 0.0–0.5)
Eosinophils Relative: 0 %
HCT: 24.1 % — ABNORMAL LOW (ref 39.0–52.0)
Hemoglobin: 7.1 g/dL — ABNORMAL LOW (ref 13.0–17.0)
Immature Granulocytes: 1 %
Lymphocytes Relative: 19 %
Lymphs Abs: 2.5 10*3/uL (ref 0.7–4.0)
MCH: 23.7 pg — ABNORMAL LOW (ref 26.0–34.0)
MCHC: 29.5 g/dL — ABNORMAL LOW (ref 30.0–36.0)
MCV: 80.3 fL (ref 80.0–100.0)
Monocytes Absolute: 1.5 10*3/uL — ABNORMAL HIGH (ref 0.1–1.0)
Monocytes Relative: 12 %
Neutro Abs: 8.8 10*3/uL — ABNORMAL HIGH (ref 1.7–7.7)
Neutrophils Relative %: 67 %
Platelets: 603 10*3/uL — ABNORMAL HIGH (ref 150–400)
RBC: 3 MIL/uL — ABNORMAL LOW (ref 4.22–5.81)
RDW: 20.1 % — ABNORMAL HIGH (ref 11.5–15.5)
WBC: 12.9 10*3/uL — ABNORMAL HIGH (ref 4.0–10.5)
nRBC: 0 % (ref 0.0–0.2)

## 2022-05-25 LAB — TROPONIN I (HIGH SENSITIVITY)
Troponin I (High Sensitivity): 8 ng/L
Troponin I (High Sensitivity): 9 ng/L

## 2022-05-25 LAB — CMP (CANCER CENTER ONLY)
ALT: 9 U/L (ref 0–44)
AST: 11 U/L — ABNORMAL LOW (ref 15–41)
Albumin: 2.8 g/dL — ABNORMAL LOW (ref 3.5–5.0)
Alkaline Phosphatase: 65 U/L (ref 38–126)
Anion gap: 9 (ref 5–15)
BUN: 17 mg/dL (ref 8–23)
CO2: 24 mmol/L (ref 22–32)
Calcium: 11.6 mg/dL — ABNORMAL HIGH (ref 8.9–10.3)
Chloride: 102 mmol/L (ref 98–111)
Creatinine: 0.86 mg/dL (ref 0.61–1.24)
GFR, Estimated: >60 mL/min (ref >60)
Glucose, Bld: 106 mg/dL — ABNORMAL HIGH (ref 70–99)
Potassium: 4.4 mmol/L (ref 3.5–5.1)
Sodium: 135 mmol/L (ref 135–145)
Total Bilirubin: 0.3 mg/dL (ref 0.3–1.2)
Total Protein: 9 g/dL — ABNORMAL HIGH (ref 6.5–8.1)

## 2022-05-25 LAB — CBC WITH DIFFERENTIAL (CANCER CENTER ONLY)
Abs Immature Granulocytes: 0.08 10*3/uL — ABNORMAL HIGH (ref 0.00–0.07)
Basophils Absolute: 0.1 10*3/uL (ref 0.0–0.1)
Basophils Relative: 1 %
Eosinophils Absolute: 0.1 10*3/uL (ref 0.0–0.5)
Eosinophils Relative: 1 %
HCT: 25.5 % — ABNORMAL LOW (ref 39.0–52.0)
Hemoglobin: 7.6 g/dL — ABNORMAL LOW (ref 13.0–17.0)
Immature Granulocytes: 1 %
Lymphocytes Relative: 24 %
Lymphs Abs: 3.5 10*3/uL (ref 0.7–4.0)
MCH: 23.3 pg — ABNORMAL LOW (ref 26.0–34.0)
MCHC: 29.8 g/dL — ABNORMAL LOW (ref 30.0–36.0)
MCV: 78.2 fL — ABNORMAL LOW (ref 80.0–100.0)
Monocytes Absolute: 1.6 10*3/uL — ABNORMAL HIGH (ref 0.1–1.0)
Monocytes Relative: 11 %
Neutro Abs: 9 10*3/uL — ABNORMAL HIGH (ref 1.7–7.7)
Neutrophils Relative %: 62 %
Platelet Count: 692 10*3/uL — ABNORMAL HIGH (ref 150–400)
RBC: 3.26 MIL/uL — ABNORMAL LOW (ref 4.22–5.81)
RDW: 19.7 % — ABNORMAL HIGH (ref 11.5–15.5)
WBC Count: 14.4 10*3/uL — ABNORMAL HIGH (ref 4.0–10.5)
nRBC: 0 % (ref 0.0–0.2)

## 2022-05-25 LAB — PREPARE RBC (CROSSMATCH)

## 2022-05-25 LAB — RESP PANEL BY RT-PCR (RSV, FLU A&B, COVID)  RVPGX2
Influenza A by PCR: NEGATIVE
Influenza B by PCR: NEGATIVE
Resp Syncytial Virus by PCR: NEGATIVE
SARS Coronavirus 2 by RT PCR: NEGATIVE

## 2022-05-25 LAB — COMPREHENSIVE METABOLIC PANEL
ALT: 11 U/L (ref 0–44)
AST: 14 U/L — ABNORMAL LOW (ref 15–41)
Albumin: 1.6 g/dL — ABNORMAL LOW (ref 3.5–5.0)
Alkaline Phosphatase: 56 U/L (ref 38–126)
Anion gap: 9 (ref 5–15)
BUN: 16 mg/dL (ref 8–23)
CO2: 24 mmol/L (ref 22–32)
Calcium: 10.4 mg/dL — ABNORMAL HIGH (ref 8.9–10.3)
Chloride: 102 mmol/L (ref 98–111)
Creatinine, Ser: 0.75 mg/dL (ref 0.61–1.24)
GFR, Estimated: >60 mL/min (ref >60)
Glucose, Bld: 91 mg/dL (ref 70–99)
Potassium: 4.2 mmol/L (ref 3.5–5.1)
Sodium: 135 mmol/L (ref 135–145)
Total Bilirubin: 0.5 mg/dL (ref 0.3–1.2)
Total Protein: 7.6 g/dL (ref 6.5–8.1)

## 2022-05-25 LAB — MAGNESIUM: Magnesium: 1.9 mg/dL (ref 1.7–2.4)

## 2022-05-25 LAB — PROTIME-INR
INR: 1.2 (ref 0.8–1.2)
Prothrombin Time: 15 seconds (ref 11.4–15.2)

## 2022-05-25 LAB — BRAIN NATRIURETIC PEPTIDE: B Natriuretic Peptide: 54.3 pg/mL (ref 0.0–100.0)

## 2022-05-25 LAB — LACTIC ACID, PLASMA
Lactic Acid, Venous: 1.9 mmol/L (ref 0.5–1.9)
Lactic Acid, Venous: 1.5 mmol/L (ref 0.5–1.9)

## 2022-05-25 LAB — TSH: TSH: 3.432 u[IU]/mL (ref 0.350–4.500)

## 2022-05-25 LAB — POC OCCULT BLOOD, ED: Fecal Occult Bld: NEGATIVE

## 2022-05-25 MED ORDER — ZOLPIDEM TARTRATE 5 MG PO TABS: 5.0000 mg | ORAL_TABLET | Freq: Every evening | ORAL | Status: DC | PRN

## 2022-05-25 MED ORDER — LACTATED RINGERS IV BOLUS (SEPSIS)
1000.0000 mL | Freq: Once | INTRAVENOUS | Status: AC
  Administered 2022-05-25: 1000 mL via INTRAVENOUS

## 2022-05-25 MED ORDER — LORAZEPAM 2 MG/ML IJ SOLN: 1.0000 mg | Freq: Four times a day (QID) | INTRAMUSCULAR | Status: DC | PRN

## 2022-05-25 MED ORDER — IOHEXOL 350 MG/ML SOLN
75.0000 mL | Freq: Once | INTRAVENOUS | Status: AC | PRN
  Administered 2022-05-25: 75 mL via INTRAVENOUS

## 2022-05-25 MED ORDER — LACTATED RINGERS IV BOLUS (SEPSIS)
1000.0000 mL | Freq: Once | INTRAVENOUS | Status: AC
Start: 2022-05-25 — End: 2022-05-25
  Administered 2022-05-25: 1000 mL via INTRAVENOUS

## 2022-05-25 MED ORDER — SODIUM CHLORIDE 0.9 % IV SOLN: 10.0000 mL/h | Freq: Once | INTRAVENOUS | Status: DC

## 2022-05-25 MED ORDER — MORPHINE SULFATE (PF) 2 MG/ML IV SOLN: 2.0000 mg | INTRAVENOUS | Status: DC | PRN

## 2022-05-25 NOTE — Progress Notes (Signed)
Patient transferred to ED with CC of tachycardia, increased weakness, and decreased oral intake. Report given to [place], Therapist, sports.

## 2022-05-25 NOTE — ED Triage Notes (Addendum)
Patient presents from the [facility]. He was scheduled to get treated for liver and lung cancer. The MD sent him to the ED instead with concerns of patient weakness, dehydration and malaise. Patient HR was also 115.   Patient's family also reports the patient has had shortness of breath, back pain, left hip pain and decreased PO intake and coughing. They deny nausea and vomiting.

## 2022-05-25 NOTE — ED Provider Notes (Signed)
  Physical Exam  BP 135/86   Pulse (!) 104   Temp 99.9 F (37.7 C) (Rectal)   Resp 18   SpO2 100%   Physical Exam  Procedures  Procedures  ED Course / MDM    Medical Decision Making Patient care assumed at 3 pm. Patient here with symptomatic anemia. Has metastatic renal cancer.  Patient had labs drawn this morning and hemoglobin is trending down.  Oncology recommended transfusion and also goals of care discussion with possible hospice.  Signout pending repeat labs and admission  4:38 PM Dr. Doren Custard ordered blood transfusion.  I had a long discussion with patient about goals of care.  He states that he is not ready for hospice right now.  He states that he felt that he can still get better and wants blood and IV fluids.  Hospitalist to admit at this point.  Maybe palliative care consult for goals of care discussion may be beneficial  Amount and/or Complexity of Data Reviewed Labs: ordered. Radiology: ordered and independent interpretation performed. Decision-making details documented in ED Course. ECG/medicine tests: ordered and independent interpretation performed. Decision-making details documented in ED Course.  Risk Prescription drug management.         Drenda Freeze, MD 05/25/22 [phone]

## 2022-05-25 NOTE — ED Provider Notes (Signed)
[facility] DEPT Provider Note   CSN: [id number] Arrival date & time: 05/25/22  1223     History  Chief Complaint  Patient presents with   Weakness   Fatigue   Shortness of Breath    Chad Cantu is a 71 y.o. male.  HPI Patient presents for failure to thrive and shortness of breath.  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  He has been living at home with his wife.  He has had very poor p.o. intake.  He is only able to tolerate small bites and sips.  He denies nausea and has not had vomiting.  He states that he simply does not have an appetite.  He has had severe generalized weakness.  He is is still able to walk short distance from bed to bathroom but is unable to tolerate any walking further than that.  He has gotten a wheelchair for at home.  He states that he feels short of breath all the time and that this is not worsened with exertion.  His mobility is limited by fatigue and generalized weakness.  Patient has had some intermittent pain in his upper back as well as his lower left abdomen.  He denies any pain currently.  He did previously have some diarrhea that has since resolved.  He was seen at oncology office today.  His oncologist, Dr. Alen Blew, was concerned about his shortness of breath and tachycardia.  He does feel that patient would benefit from admission and this was discussed with the patient and wife.  He was sent to the ED for further evaluation.    Home Medications Prior to Admission medications   Medication Sig Start Date End Date Taking? Authorizing Provider  acetaminophen (TYLENOL) 325 MG tablet Take 2 tablets (650 mg total) by mouth every 6 (six) hours as needed for mild pain (or Fever >/= 101). Patient not taking: Reported on 05/18/2022 11/22/20   Domenic Polite, MD  atorvastatin (LIPITOR) 20 MG tablet TAKE 1 TABLET BY MOUTH DAILY. Patient taking  differently: Take 20 mg by mouth daily. 04/28/22 04/28/23  [name], MD  axitinib (INLYTA) 1 MG tablet Take 1 tablet (1 mg total) by mouth daily. 05/04/22   [name], MD  axitinib (INLYTA) 5 MG tablet Take 1 tablet (5 mg total) by mouth daily. Patient not taking: Reported on 05/19/2022 03/08/22   [name], MD  CABOMETYX 20 MG tablet TAKE 1 TABLET DAILY. TAKE ON AN EMPTY STOMACH, 1 HOUR BEFORE OR 2 HOURS AFTER MEALS. Patient not taking: Reported on 03/17/2022 03/15/22   [name], MD  ferrous sulfate 325 (65 FE) MG tablet TAKE 1 TABLET (325 MG TOTAL) BY MOUTH IN THE MORNING AND AT BEDTIME. 02/02/21 02/02/22  [name], MD  lisinopril-hydrochlorothiazide (ZESTORETIC) 10-12.5 MG tablet TAKE 1 TABLET BY MOUTH DAILY. 06/05/20 06/05/21  [name], MD  megestrol (MEGACE) 40 MG/ML suspension SHAKE LIQUID AND TAKE 10 ML BY MOUTH EVERY DAY Patient not taking: Reported on 05/19/2022 12/08/21   [name], MD  metFORMIN (GLUCOPHAGE) 500 MG tablet TAKE 1 TABLET (500 MG TOTAL) BY MOUTH DAILY WITH BREAKFAST. 01/26/22 01/26/23  [name], MD  mirtazapine (REMERON) 7.5 MG tablet Take 1 tablet (7.5 mg total) by mouth at bedtime. Patient not taking: Reported on 05/19/2022 04/08/21   [name], MD  lisinopril (ZESTRIL) 20 MG tablet Take 20 mg  by mouth daily. 03/26/20 09/20/20  [provider]  prochlorperazine (COMPAZINE) 10 MG tablet Take 1 tablet (10 mg total) by mouth every 6 (six) hours as needed for nausea or vomiting. Patient not taking: Reported on 02/18/2021 12/09/20 02/18/21  [name], MD      Allergies    Patient has no known allergies.    Review of Systems   Review of Systems  Constitutional:  Positive for activity change, appetite change, fatigue and unexpected weight change.  Respiratory:  Positive for cough and shortness of breath.   Gastrointestinal:  Positive for abdominal pain.  Musculoskeletal:  Positive for back pain.  Neurological:   Positive for weakness (Generalized).  All other systems reviewed and are negative.   Physical Exam Updated Vital Signs BP 135/86   Pulse (!) 104   Temp 99.9 F (37.7 C) (Rectal)   Resp 18   SpO2 100%  Physical Exam Vitals and nursing note reviewed.  Constitutional:      General: He is not in acute distress.    Appearance: He is well-developed and normal weight. He is ill-appearing (Chronically). He is not toxic-appearing or diaphoretic.  HENT:     Head: Normocephalic and atraumatic.     Right Ear: External ear normal.     Left Ear: External ear normal.     Nose: Nose normal.     Mouth/Throat:     Mouth: Mucous membranes are dry.     Pharynx: Oropharynx is clear.  Eyes:     General: No scleral icterus.    Extraocular Movements: Extraocular movements intact.     Conjunctiva/sclera: Conjunctivae normal.  Cardiovascular:     Rate and Rhythm: Normal rate and regular rhythm.     Heart sounds: No murmur heard. Pulmonary:     Effort: Pulmonary effort is normal. No tachypnea, accessory muscle usage, prolonged expiration or respiratory distress.     Breath sounds: Decreased breath sounds present. No wheezing or rhonchi.  Abdominal:     Palpations: Abdomen is soft.     Tenderness: There is no abdominal tenderness.  Musculoskeletal:        General: No swelling, tenderness or deformity. Normal range of motion.     Cervical back: Normal range of motion and neck supple.     Right lower leg: No edema.     Left lower leg: No edema.  Skin:    General: Skin is warm and dry.     Capillary Refill: Capillary refill takes less than 2 seconds.     Coloration: Skin is pale. Skin is not jaundiced.  Neurological:     General: No focal deficit present.     Mental Status: He is alert and oriented to person, place, and time.     Cranial Nerves: No cranial nerve deficit.     Sensory: No sensory deficit.     Motor: No weakness.     Coordination: Coordination normal.  Psychiatric:        Mood  and Affect: Mood normal.        Behavior: Behavior normal.        Thought Content: Thought content normal.        Judgment: Judgment normal.     ED Results / Procedures / Treatments   Labs (all labs ordered are listed, but only abnormal results are displayed) Labs Reviewed  CBC WITH DIFFERENTIAL/PLATELET - Abnormal; Notable for the following components:      Result Value   WBC 12.9 (*)  RBC 3.00 (*)    Hemoglobin 7.1 (*)    HCT 24.1 (*)    MCH 23.7 (*)    MCHC 29.5 (*)    RDW 20.1 (*)    Platelets 603 (*)    Neutro Abs 8.8 (*)    Monocytes Absolute 1.5 (*)    All other components within normal limits  COMPREHENSIVE METABOLIC PANEL - Abnormal; Notable for the following components:   Calcium 10.4 (*)    Albumin 1.6 (*)    AST 14 (*)    All other components within normal limits  RESP PANEL BY RT-PCR (RSV, FLU A&B, COVID)  RVPGX2  CULTURE, BLOOD (ROUTINE X 2)  CULTURE, BLOOD (ROUTINE X 2)  LACTIC ACID, PLASMA  LACTIC ACID, PLASMA  URINALYSIS, ROUTINE W REFLEX MICROSCOPIC  BRAIN NATRIURETIC PEPTIDE  MAGNESIUM  PROTIME-INR  CBC WITH DIFFERENTIAL/PLATELET  POC OCCULT BLOOD, ED  TYPE AND SCREEN  PREPARE RBC (CROSSMATCH)  TROPONIN I (HIGH SENSITIVITY)  TROPONIN I (HIGH SENSITIVITY)    EKG EKG Interpretation  Date/Time:  Tuesday May 25 2022 12:50:44 EDT Ventricular Rate:  103 PR Interval:  140 QRS Duration: 187 QT Interval:  327 QTC Calculation: 428 R Axis:   1 Text Interpretation: Sinus tachycardia Confirmed by Godfrey Pick ([id number]) on 05/25/2022 1:04:35 PM  Radiology CT Angio Chest PE W and/or Wo Contrast  Result Date: 05/25/2022 CLINICAL DATA:  Renal cell carcinoma with liver and lung metastasis. Weakness. Dehydration. Malaise. Evaluate for pulmonary embolism. * Tracking Code: BO * EXAM: CT ANGIOGRAPHY CHEST WITH CONTRAST TECHNIQUE: Multidetector CT imaging of the chest was performed using the standard protocol during bolus administration of intravenous contrast.  Multiplanar CT image reconstructions and MIPs were obtained to evaluate the vascular anatomy. RADIATION DOSE REDUCTION: This exam was performed according to the departmental dose-optimization program which includes automated exposure control, adjustment of the mA and/or kV according to patient size and/or use of iterative reconstruction technique. CONTRAST:  33m OMNIPAQUE IOHEXOL 350 MG/ML SOLN COMPARISON:  Chest radiograph earlier today.  Chest CT 02/25/2022. FINDINGS: Cardiovascular: The quality of this exam for evaluation of pulmonary embolism is moderate. Limitations include motion and suboptimal bolus timing. No embolism to the large segmental level. Aortic atherosclerosis. Mild cardiomegaly, without pericardial effusion. Pulmonary artery enlargement, outflow tract 3.2 cm Mediastinum/Nodes: Left low jugular/supraclavicular 9 mm node on 03/04 is unchanged. No mediastinal or hilar adenopathy. Esophageal fluid level on 24/4. Lungs/Pleura: No pleural fluid. Bilateral pulmonary nodules. A pleural-based posterior right upper lobe pulmonary nodule measures 9 mm on 55/6 versus 6 mm on the prior exam (when remeasured). Pleural-based medial right upper lobe pulmonary nodule measures 12 mm on 32/6 versus 11 mm on the prior (when remeasured). Posterior right apical pleural-based 11 mm nodule on 21/6 measured 9 mm on the prior exam (when remeasured). Upper Abdomen: Motion degradation and bolus timing limit evaluation of known hepatic metastasis. An index right hepatic lobe pericholecystic lesion measures 2.6 x 2.6 cm on 86/4 and is similar to on the prior exam (when remeasured). Smaller satellite lesions of up to 11 mm just laterally are also not significantly changed. Normal imaged portions of the spleen, stomach, pancreas, adrenal glands, kidneys. Musculoskeletal: No acute osseous abnormality. Review of the MIP images confirms the above findings. IMPRESSION: 1. No pulmonary embolism with above limitations. 2. Progressive  pulmonary metastasis compared to 02/25/2022. 3. Suboptimally evaluated but grossly similar hepatic metastasis. 4. Similar low left jugular/supraclavicular adenopathy, likely nodal metastasis. Electronically Signed   By: KAdria DevonD.  On: 05/25/2022 13:52    Procedures Procedures    Medications Ordered in ED Medications  0.9 %  sodium chloride infusion (has no administration in time range)  lactated ringers bolus 1,000 mL (1,000 mLs Intravenous New Bag/Given 05/25/22 1248)  iohexol (OMNIPAQUE) 350 MG/ML injection 75 mL (75 mLs Intravenous Contrast Given 05/25/22 1314)  lactated ringers bolus 1,000 mL (1,000 mLs Intravenous New Bag/Given 05/25/22 1416)    ED Course/ Medical Decision Making/ A&P                           Medical Decision Making Amount and/or Complexity of Data Reviewed Labs: ordered. Radiology: ordered. ECG/medicine tests: ordered.  Risk Prescription drug management.   This patient presents to the ED for concern of fatigue and shortness of breath, this involves an extensive number of treatment options, and is a complaint that carries with it a high risk of complications and morbidity.  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  The pertinent results include: Worsening anemia, baseline leukocytosis and leukocytosis; hypercalcemia with otherwise normal electrolytes; normal lactate, normal troponin, normal BNP   Imaging Studies ordered:  I ordered imaging studies including CTA chest I independently visualized and  interpreted imaging which showed progressive pulmonary metastases with no other acute findings I agree with the radiologist interpretation   Cardiac Monitoring: / EKG:  The patient was maintained on a cardiac monitor.  I personally viewed and interpreted the cardiac monitored which showed an underlying rhythm of: Sinus rhythm  Problem List / ED Course / Critical interventions / Medication management  Patient is a 71 year old male with known metastatic renal cancer, presenting for worsening shortness of breath, fatigue, decreased p.o. intake, and generalized weakness.  He was seen by his oncologist, Dr. Alen Blew prior to arrival.  At that time, patient had tachycardia and increased work of breathing.  He was sent to the ED for further evaluation with plans for admission.  Patient is normotensive with normal SPO2 on room air on arrival.  Heart rate is in the range of 100.  His breathing appears unlabored while at rest.  He denies any current pain.  He has no areas of tenderness.  Lungs are clear to auscultation at this time.  Laboratory work-up and CTA of chest studies were ordered.  Given his recent poor p.o. intake, patient was treated with for dehydration with IV fluids.  On CTA chest, there is no evidence of PE.  Scan did show progression of metastatic disease.  Patient's laboratory work-up shows no elevation in cardiac enzymes.  No infectious etiology is identified.  At baseline, patient has elevated WBC.  Rectal temperature was 99.9.  Patient's lactate was normal.  On CBC, he does have a downtrending hemoglobin.  Following IV fluids, patient reported mild improvement in symptoms.  He continued to feel lightheaded with sitting up.  Patient to be treated for symptomatic anemia.  1 unit PRBCs was ordered.  Plan will be for admission.  Care of patient was signed out to oncoming ED provider. I ordered medication including IV fluids for dehydration; PRBCs for  symptomatic anemia Reevaluation of the patient  after these medicines showed that the patient improved I have reviewed the patients home medicines and have made adjustments as needed   Social Determinants of Health:  Current metastatic renal cancer.  Discussions for hospice were had at oncology appointment today.  CRITICAL CARE Performed by: Godfrey Pick   Total critical care time: 35 minutes  Critical care time was exclusive of separately billable procedures and treating other patients.  Critical care was necessary to treat or prevent imminent or life-threatening deterioration.  Critical care was time spent personally by me on the following activities: development of treatment plan with patient and/or surrogate as well as nursing, discussions with consultants, evaluation of patient's response to treatment, examination of patient, obtaining history from patient or surrogate, ordering and performing treatments and interventions, ordering and review of laboratory studies, ordering and review of radiographic studies, pulse oximetry and re-evaluation of patient's condition.         Final Clinical Impression(s) / ED Diagnoses Final diagnoses:  Dehydration  Generalized weakness  Fatigue, unspecified type  Symptomatic anemia  Hypercalcemia    Rx / DC Orders ED Discharge Orders     None         Godfrey Pick, MD 05/25/22 1622

## 2022-05-25 NOTE — H&P (Signed)
History and Physical    Chad Cantu  HAL:[id number]  DOB: Jul 16, 1951  DOA: 05/25/2022 PCP: [name], MD   Patient coming from: Home  Chief Complaint: Sent from cancer center due to weakness  HPI: Chad Cantu is a 71 y.o. 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  He has had a rapid decline and when being evaluated by his oncologist, after [facility], today was noted to be severely weak.  His hemoglobin was low and calcium was 10.5.  He has referred him to the ED to further treat him.  According to Dr. Hazeline Junker note, the patient prognosis is poor and he has recommended transitioning to hospice.  The patient is confused and unable to participate in history.  He has forgotten that he has cancer. The patient's wife states that he has completely stopped eating but has been able to drink but even this has decreased.  She has been helping him to get up and around as he is unable to do it independently.  I have discussed plan of care and she does state that Dr. Alen Blew mention hospice but she is not sure what hospice is.   ED Course: Dr. Darl Householder had a goals of care conversation with the patient and he is not yet ready to accept hospice and comfort care.  Blood transfusion and IV fluids have been ordered for him.  Review of Systems:  All other systems reviewed and apart from HPI, are negative.  Past Medical History:  Diagnosis Date   Cancer ([place])    Diabetes mellitus without complication ([place])    ETOH abuse    Hyperlipidemia    Hypertension    Substance abuse ([place])    ETOH, cocaine.  Remission    Past Surgical History:  Procedure Laterality Date   COLONOSCOPY  5 years ago?   at [facility] maybe per pt,?unsure results   LAPAROSCOPIC GASTROTOMY W/ REPAIR OF ULCER      Social History:   reports that he has quit smoking. His smoking use included cigarettes. He has never used  smokeless tobacco. He reports that he does not currently use alcohol after a past usage of about 12.0 standard drinks of alcohol per week. He reports that he does not currently use drugs after having used the following drugs: Marijuana.  No Known Allergies  Family History  Problem Relation Age of Onset   Diabetes Mother    Colon cancer Neg Hx    Colon polyps Neg Hx    Esophageal cancer Neg Hx    Rectal cancer Neg Hx    Stomach cancer Neg Hx      Prior to Admission medications   Medication Sig Start Date End Date Taking? Authorizing Provider  acetaminophen (TYLENOL) 325 MG tablet Take 2 tablets (650 mg total) by mouth every 6 (six) hours as needed for mild pain (or Fever >/= 101). Patient not taking: Reported on 05/18/2022 11/22/20   Domenic Polite, MD  atorvastatin (LIPITOR) 20 MG tablet TAKE 1 TABLET BY MOUTH DAILY. Patient taking differently: Take 20 mg by mouth daily. 04/28/22 04/28/23  [name], MD  axitinib (INLYTA) 1 MG tablet Take 1 tablet (1 mg total) by mouth daily. 05/04/22   [name], MD  axitinib (INLYTA) 5 MG tablet Take 1 tablet (5 mg total) by mouth daily. Patient not taking: Reported on 05/19/2022 03/08/22   [name], MD  CABOMETYX 20 MG tablet TAKE 1 TABLET DAILY. TAKE ON AN EMPTY STOMACH, 1 HOUR BEFORE OR 2 HOURS AFTER MEALS. Patient not taking: Reported on 03/17/2022 03/15/22   [name], MD  ferrous sulfate 325 (65 FE) MG tablet TAKE 1 TABLET (325 MG TOTAL) BY MOUTH IN THE MORNING AND AT BEDTIME. 02/02/21 02/02/22  [name], MD  lisinopril-hydrochlorothiazide (ZESTORETIC) 10-12.5 MG tablet TAKE 1 TABLET BY MOUTH DAILY. 06/05/20 06/05/21  [name], MD  megestrol (MEGACE) 40 MG/ML suspension SHAKE LIQUID AND TAKE 10 ML BY MOUTH EVERY DAY Patient not taking: Reported on 05/19/2022 12/08/21   [name], MD  metFORMIN (GLUCOPHAGE) 500 MG tablet TAKE 1 TABLET (500 MG TOTAL) BY MOUTH DAILY WITH BREAKFAST. 01/26/22 01/26/23  [name], MD  mirtazapine (REMERON) 7.5 MG tablet Take 1 tablet (7.5 mg total) by mouth at bedtime. Patient not taking: Reported on 05/19/2022 04/08/21   [name], MD  lisinopril (ZESTRIL) 20 MG tablet Take 20 mg by mouth daily. 03/26/20 09/20/20  [provider]  prochlorperazine (COMPAZINE) 10 MG tablet Take 1 tablet (10 mg total) by mouth every 6 (six) hours as needed for nausea or vomiting. Patient not taking: Reported on 02/18/2021 12/09/20 02/18/21  [name], MD    Physical Exam: Wt Readings from Last 3 Encounters:  05/18/22 65.8 kg  04/08/22 64.9 kg  03/03/22 67.7 kg   Vitals:   05/25/22 1415 05/25/22 1445 05/25/22 1519 05/25/22 1600  BP: (!) 117/103 128/81  135/86  Pulse: (!) 103 (!) 101  (!) 104  Resp: (!) 24 (!) 22  18  Temp:   99.9 F (37.7 C)   TempSrc:   Rectal   SpO2: 100% 99%  100%      Constitutional:  Calm & comfortable Eyes: PERRLA, lids and conjunctivae normal ENT:  Mucous membranes are moist.  Pharynx clear of exudate   Normal dentition.  Respiratory:  Clear to auscultation bilaterally  Normal respiratory effort.  Cardiovascular:  S1 & S2 heard, regular rate and rhythm No Murmurs Abdomen:  Non distended No tenderness, No masses Bowel sounds normal Extremities:  No clubbing / cyanosis No pedal edema  Skin:  No rashes, lesions or ulcers Neurologic:  The patient is oriented only to self.  He moves all 4 extremities independently. Psychiatric:  Normal Mood and affect    Labs on Admission: I have personally reviewed following labs and imaging studies  CBC: Recent Labs  Lab 05/19/22 0005 05/25/22 1141 05/25/22 1513  WBC 15.1* 14.4* 12.9*  NEUTROABS  --  9.0* 8.8*  HGB 7.5* 7.6* 7.1*  HCT 25.6* 25.5* 24.1*  MCV 80.5 78.2* 80.3  PLT 661* 692* 161*   Basic Metabolic Panel: Recent Labs  Lab 05/19/22 0005 05/25/22 1141 05/25/22 1308 05/25/22 1530  NA 139 135  --  135  K 3.1* 4.4  --  4.2  CL 109 102  --  102  CO2 19* 24   --  24  GLUCOSE 130* 106*  --  91  BUN 8 17  --  16  CREATININE 1.06 0.86  --  0.75  CALCIUM 10.5* 11.6*  --  10.4*  MG  --   --  1.9  --    GFR: Estimated Creatinine Clearance: 79.8 mL/min (by C-G formula based on SCr of 0.75 mg/dL). Liver Function Tests: Recent Labs  Lab 05/19/22 0005 05/25/22 1141 05/25/22 1530  AST 13* 11* 14*  ALT 11 9 11  ALKPHOS 76 65 56  BILITOT 0.5 0.3 0.5  PROT 8.9* 9.0* 7.6  ALBUMIN 2.0* 2.8* 1.6*   No results for input(s): "LIPASE", "AMYLASE" in the last 168 hours. No results for input(s): "AMMONIA" in the last 168 hours. Coagulation Profile: Recent Labs  Lab 05/19/22 0005 05/25/22 1436  INR 1.2 1.2   Cardiac Enzymes: No results for input(s): "CKTOTAL", "CKMB", "CKMBINDEX", "TROPONINI" in the last 168 hours. BNP (last 3 results) No results for input(s): "PROBNP" in the last 8760 hours. HbA1C: No results for input(s): "HGBA1C" in the last 72 hours. CBG: No results for input(s): "GLUCAP" in the last 168 hours. Lipid Profile: No results for input(s): "CHOL", "HDL", "LDLCALC", "TRIG", "CHOLHDL", "LDLDIRECT" in the last 72 hours. Thyroid Function Tests: No results for input(s): "TSH", "T4TOTAL", "FREET4", "T3FREE", "THYROIDAB" in the last 72 hours. Anemia Panel: No results for input(s): "VITAMINB12", "FOLATE", "FERRITIN", "TIBC", "IRON", "RETICCTPCT" in the last 72 hours. Urine analysis:    Component Value Date/Time   COLORURINE YELLOW 05/25/2022 1415   APPEARANCEUR CLEAR 05/25/2022 1415   APPEARANCEUR Clear 05/02/2017 1702   LABSPEC 1.029 05/25/2022 1415   PHURINE 7.0 05/25/2022 1415   GLUCOSEU NEGATIVE 05/25/2022 1415   HGBUR NEGATIVE 05/25/2022 1415   BILIRUBINUR NEGATIVE 05/25/2022 1415   BILIRUBINUR Negative 05/02/2017 1702   KETONESUR NEGATIVE 05/25/2022 1415   PROTEINUR NEGATIVE 05/25/2022 1415   NITRITE NEGATIVE 05/25/2022 1415   LEUKOCYTESUR NEGATIVE 05/25/2022 1415   Sepsis  Labs: '@LABRCNTIP'$ (procalcitonin:4,lacticidven:4) ) Recent Results (from the past 240 hour(s))  Blood culture (routine x 2)     Status: Abnormal   Collection Time: 05/19/22 12:35 AM   Specimen: BLOOD  Result Value Ref Range Status   Specimen Description   Final    BLOOD RIGHT ANTECUBITAL Performed at [facility], [place] [address], [place], [place] [address]    Special Requests   Final    BOTTLES DRAWN AEROBIC AND ANAEROBIC Blood Culture results may not be optimal due to an inadequate volume of blood received in culture bottles Performed at [facility] [address] [place], [place] [address]    Culture  Setup Time   Final    GRAM POSITIVE COCCI IN CLUSTERS AEROBIC BOTTLE ONLY CRITICAL RESULT CALLED TO, READ BACK BY AND VERIFIED WITH: L MANUAL,RN'@0338'$  05/20/22 [place]    Culture (A)  Final    STAPHYLOCOCCUS EPIDERMIDIS THE SIGNIFICANCE OF ISOLATING THIS ORGANISM FROM A SINGLE SET OF BLOOD CULTURES WHEN MULTIPLE SETS ARE DRAWN IS UNCERTAIN. PLEASE NOTIFY THE MICROBIOLOGY DEPARTMENT WITHIN ONE WEEK IF SPECIATION AND SENSITIVITIES ARE REQUIRED. Performed at [facility], [place] [address], [place], [place] [address]    Report Status 05/22/2022 FINAL  Final  Blood culture (routine x 2)     Status: None   Collection Time: 05/19/22 12:35 AM   Specimen: BLOOD  Result Value Ref Range Status   Specimen Description   Final    BLOOD LEFT ANTECUBITAL Performed at [facility] [address] [place], [place] [address]    Special Requests   Final    BOTTLES DRAWN AEROBIC AND ANAEROBIC Blood Culture adequate volume Performed at [facility] [address], [place], [place] [address]    Culture   Final    NO GROWTH 5 DAYS Performed at [facility], [place] [address] [place], [place] [address]    Report Status 05/24/2022 FINAL  Final  Blood Culture ID Panel (Reflexed)     Status:  Abnormal   Collection Time:  05/19/22 12:35 AM  Result Value Ref Range Status   Enterococcus faecalis NOT DETECTED NOT DETECTED Final   Enterococcus Faecium NOT DETECTED NOT DETECTED Final   Listeria monocytogenes NOT DETECTED NOT DETECTED Final   Staphylococcus species DETECTED (A) NOT DETECTED Final    Comment: CRITICAL RESULT CALLED TO, READ BACK BY AND VERIFIED WITH: L MANUAL,RN'@0339'$  05/20/22 [place]    Staphylococcus aureus (BCID) NOT DETECTED NOT DETECTED Final   Staphylococcus epidermidis DETECTED (A) NOT DETECTED Final    Comment: Methicillin (oxacillin) resistant coagulase negative staphylococcus. Possible blood culture contaminant (unless isolated from more than one blood culture draw or clinical case suggests pathogenicity). No antibiotic treatment is indicated for blood  culture contaminants. CRITICAL RESULT CALLED TO, READ BACK BY AND VERIFIED WITH:  L MANUAL,RN'@0339'$  05/20/22 [place]    Staphylococcus lugdunensis NOT DETECTED NOT DETECTED Final   Streptococcus species NOT DETECTED NOT DETECTED Final   Streptococcus agalactiae NOT DETECTED NOT DETECTED Final   Streptococcus pneumoniae NOT DETECTED NOT DETECTED Final   Streptococcus pyogenes NOT DETECTED NOT DETECTED Final   A.calcoaceticus-baumannii NOT DETECTED NOT DETECTED Final   Bacteroides fragilis NOT DETECTED NOT DETECTED Final   Enterobacterales NOT DETECTED NOT DETECTED Final   Enterobacter cloacae complex NOT DETECTED NOT DETECTED Final   Escherichia coli NOT DETECTED NOT DETECTED Final   Klebsiella aerogenes NOT DETECTED NOT DETECTED Final   Klebsiella oxytoca NOT DETECTED NOT DETECTED Final   Klebsiella pneumoniae NOT DETECTED NOT DETECTED Final   Proteus species NOT DETECTED NOT DETECTED Final   Salmonella species NOT DETECTED NOT DETECTED Final   Serratia marcescens NOT DETECTED NOT DETECTED Final   Haemophilus influenzae NOT DETECTED NOT DETECTED Final   Neisseria meningitidis NOT DETECTED NOT DETECTED Final   Pseudomonas aeruginosa NOT  DETECTED NOT DETECTED Final   Stenotrophomonas maltophilia NOT DETECTED NOT DETECTED Final   Candida albicans NOT DETECTED NOT DETECTED Final   Candida auris NOT DETECTED NOT DETECTED Final   Candida glabrata NOT DETECTED NOT DETECTED Final   Candida krusei NOT DETECTED NOT DETECTED Final   Candida parapsilosis NOT DETECTED NOT DETECTED Final   Candida tropicalis NOT DETECTED NOT DETECTED Final   Cryptococcus neoformans/gattii NOT DETECTED NOT DETECTED Final   Methicillin resistance mecA/C DETECTED (A) NOT DETECTED Final    Comment: CRITICAL RESULT CALLED TO, READ BACK BY AND VERIFIED WITH: L MANUAL,RN'@0339'$  05/20/22 [place] Performed at [facility], [address], [place], [place] [address]   Resp panel by RT-PCR (RSV, Flu A&B, Covid) Anterior Nasal Swab     Status: None   Collection Time: 05/25/22  1:08 PM   Specimen: Anterior Nasal Swab  Result Value Ref Range Status   SARS Coronavirus 2 by RT PCR NEGATIVE NEGATIVE Final    Comment: (NOTE) SARS-CoV-2 target nucleic acids are NOT DETECTED.  The SARS-CoV-2 RNA is generally detectable in upper respiratory specimens during the acute phase of infection. The lowest concentration of SARS-CoV-2 viral copies this assay can detect is 138 copies/mL. A negative result does not preclude SARS-Cov-2 infection and should not be used as the sole basis for treatment or other patient management decisions. A negative result may occur with  improper specimen collection/handling, submission of specimen other than nasopharyngeal swab, presence of viral mutation(s) within the areas targeted by this assay, and inadequate number of viral copies(<138 copies/mL). A negative result must be combined with clinical observations, patient history, and epidemiological information. The expected result is Negative.  Fact  Sheet for Patients:  EntrepreneurPulse.com.au  Fact Sheet for Healthcare Providers:   IncredibleEmployment.be  This test is no t yet approved or cleared by the [place] FDA and  has been authorized for detection and/or diagnosis of SARS-CoV-2 by FDA under an Emergency Use Authorization (EUA). This EUA will remain  in effect (meaning this test can be used) for the duration of the COVID-19 declaration under Section 564(b)(1) of the Act, 21 U.S.C.section 360bbb-3(b)(1), unless the authorization is terminated  or revoked sooner.       Influenza A by PCR NEGATIVE NEGATIVE Final   Influenza B by PCR NEGATIVE NEGATIVE Final    Comment: (NOTE) The Xpert Xpress SARS-CoV-2/FLU/RSV plus assay is intended as an aid in the diagnosis of influenza from Nasopharyngeal swab specimens and should not be used as a sole basis for treatment. Nasal washings and aspirates are unacceptable for Xpert Xpress SARS-CoV-2/FLU/RSV testing.  Fact Sheet for Patients: EntrepreneurPulse.com.au  Fact Sheet for Healthcare Providers: IncredibleEmployment.be  This test is not yet approved or cleared by the [place] FDA and has been authorized for detection and/or diagnosis of SARS-CoV-2 by FDA under an Emergency Use Authorization (EUA). This EUA will remain in effect (meaning this test can be used) for the duration of the COVID-19 declaration under Section 564(b)(1) of the Act, 21 U.S.C. section 360bbb-3(b)(1), unless the authorization is terminated or revoked.     Resp Syncytial Virus by PCR NEGATIVE NEGATIVE Final    Comment: (NOTE) Fact Sheet for Patients: EntrepreneurPulse.com.au  Fact Sheet for Healthcare Providers: IncredibleEmployment.be  This test is not yet approved or cleared by the [place] FDA and has been authorized for detection and/or diagnosis of SARS-CoV-2 by FDA under an Emergency Use Authorization (EUA). This EUA will remain in effect (meaning this test can be used) for  the duration of the COVID-19 declaration under Section 564(b)(1) of the Act, 21 U.S.C. section 360bbb-3(b)(1), unless the authorization is terminated or revoked.  Performed at [facility], [place] [address] [place], [place] [address]      Radiological Exams on Admission: CT Angio Chest PE W and/or Wo Contrast  Result Date: 05/25/2022 CLINICAL DATA:  Renal cell carcinoma with liver and lung metastasis. Weakness. Dehydration. Malaise. Evaluate for pulmonary embolism. * Tracking Code: BO * EXAM: CT ANGIOGRAPHY CHEST WITH CONTRAST TECHNIQUE: Multidetector CT imaging of the chest was performed using the standard protocol during bolus administration of intravenous contrast. Multiplanar CT image reconstructions and MIPs were obtained to evaluate the vascular anatomy. RADIATION DOSE REDUCTION: This exam was performed according to the departmental dose-optimization program which includes automated exposure control, adjustment of the mA and/or kV according to patient size and/or use of iterative reconstruction technique. CONTRAST:  42m OMNIPAQUE IOHEXOL 350 MG/ML SOLN COMPARISON:  Chest radiograph earlier today.  Chest CT 02/25/2022. FINDINGS: Cardiovascular: The quality of this exam for evaluation of pulmonary embolism is moderate. Limitations include motion and suboptimal bolus timing. No embolism to the large segmental level. Aortic atherosclerosis. Mild cardiomegaly, without pericardial effusion. Pulmonary artery enlargement, outflow tract 3.2 cm Mediastinum/Nodes: Left low jugular/supraclavicular 9 mm node on 03/04 is unchanged. No mediastinal or hilar adenopathy. Esophageal fluid level on 24/4. Lungs/Pleura: No pleural fluid. Bilateral pulmonary nodules. A pleural-based posterior right upper lobe pulmonary nodule measures 9 mm on 55/6 versus 6 mm on the prior exam (when remeasured). Pleural-based medial right upper lobe pulmonary nodule measures 12 mm on 32/6 versus 11 mm on the prior  (when remeasured). Posterior right apical pleural-based 11  mm nodule on 21/6 measured 9 mm on the prior exam (when remeasured). Upper Abdomen: Motion degradation and bolus timing limit evaluation of known hepatic metastasis. An index right hepatic lobe pericholecystic lesion measures 2.6 x 2.6 cm on 86/4 and is similar to on the prior exam (when remeasured). Smaller satellite lesions of up to 11 mm just laterally are also not significantly changed. Normal imaged portions of the spleen, stomach, pancreas, adrenal glands, kidneys. Musculoskeletal: No acute osseous abnormality. Review of the MIP images confirms the above findings. IMPRESSION: 1. No pulmonary embolism with above limitations. 2. Progressive pulmonary metastasis compared to 02/25/2022. 3. Suboptimally evaluated but grossly similar hepatic metastasis. 4. Similar low left jugular/supraclavicular adenopathy, likely nodal metastasis. Electronically Signed   By: Abigail Miyamoto M.D.   On: 05/25/2022 13:52     Assessment/Plan Principal Problem:   Hypercalcemia of malignancy Active Problems:   Essential hypertension   Metastatic renal cell carcinoma (HCC)   Symptomatic anemia  I have discussed both comfort care and aggressive care with the patient's wife.  She states she would prefer DNR and comfort care but asked that I wait for her to speak with her daughter.  I have spoken with her afterwards and she has opted to make her husband comfort care and DNR.  She prefers that he be transition to hospice home if possible as [place] is unable to care for [place] in the [place].  She states that he has not been in any pain at home however, I will order morphine just in case.  I have also ordered Ativan and Ambien in case he needs this. I have requested a TOC consult for residential hospice placement.  I have not requested a palliative care consult.   Debbe Odea MD [facility]    05/25/2022, 5:04 PM

## 2022-05-25 NOTE — Progress Notes (Signed)
Hematology and Oncology Follow Up Visit  Chad Cantu [id number] 27-Jun-1951 71 y.o. 05/25/2022 11:54 AM Chad Cantu, MDJohnson, Chad Batman, MD   Principle Diagnosis: 71 year old man with kidney cancer diagnosed in 2021.  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   Prior Therapy:  He is status post ultrasound biopsy of the hepatic lesion on December 01, 2020.  The final pathology confirmed the presence of metastatic clear-cell renal cell carcinoma.  Ipilimumab 1 mg/kg and nivolumab 3 mg/kg therapy is started on December 23, 2019.  He completed 3 cycles of therapy with progression of disease.  Cabometyx 40 mg daily started on March 04, 2021.  Therapy switched to 20 mg daily starting in July 2022.  Therapy discontinued in March 2023 due to progression of disease.  Current therapy: Inlyta 5 mg daily started on March 17, 2022.  His dose was subsequently changed to 1 mg daily in April 2023.  Interim History: [name] returns today for follow-up visit.  Since last visit, he continues to have issues with tolerance related to Inlyta even at 1 mg daily.  He was seen in the emergency department on 05/19/2022 and his hemoglobin was 7.5 and hemoglobin of 10.5.  Clinically, he reports continuous decline in his overall health.  He lost a lot of weight and reports inability to eat.  Shortness of breath has gotten worse.  He denies any hematochezia melena.  His performance status continues to decline at this time.     Medications: Reviewed without changes. Current Outpatient Medications  Medication Sig Dispense Refill   acetaminophen (TYLENOL) 325 MG tablet Take 2 tablets (650 mg total) by mouth every 6 (six) hours as needed for mild pain (or Fever >/= 101). (Patient not taking: Reported on 05/18/2022)     atorvastatin (LIPITOR) 20 MG tablet TAKE 1 TABLET BY MOUTH DAILY. (Patient taking differently: Take 20 mg by mouth daily.) 90 tablet 0    axitinib (INLYTA) 1 MG tablet Take 1 tablet (1 mg total) by mouth daily. 30 tablet 0   axitinib (INLYTA) 5 MG tablet Take 1 tablet (5 mg total) by mouth daily. (Patient not taking: Reported on 05/19/2022) 30 tablet 1   CABOMETYX 20 MG tablet TAKE 1 TABLET DAILY. TAKE ON AN EMPTY STOMACH, 1 HOUR BEFORE OR 2 HOURS AFTER MEALS. (Patient not taking: Reported on 03/17/2022) 30 tablet 0   ferrous sulfate 325 (65 FE) MG tablet TAKE 1 TABLET (325 MG TOTAL) BY MOUTH IN THE MORNING AND AT BEDTIME. 60 tablet 3   lisinopril-hydrochlorothiazide (ZESTORETIC) 10-12.5 MG tablet TAKE 1 TABLET BY MOUTH DAILY. 90 tablet 2   megestrol (MEGACE) 40 MG/ML suspension SHAKE LIQUID AND TAKE 10 ML BY MOUTH EVERY DAY (Patient not taking: Reported on 05/19/2022) 900 mL 1   metFORMIN (GLUCOPHAGE) 500 MG tablet TAKE 1 TABLET (500 MG TOTAL) BY MOUTH DAILY WITH BREAKFAST. 30 tablet 3   mirtazapine (REMERON) 7.5 MG tablet Take 1 tablet (7.5 mg total) by mouth at bedtime. (Patient not taking: Reported on 05/19/2022) 30 tablet 3   No current facility-administered medications for this visit.     Allergies: No Known Allergies   Physical Exam:  Blood pressure 100/79, pulse (!) 115, temperature 98.1 F (36.7 C), temperature source Temporal, resp. rate 17, SpO2 100 %.    ECOG: 2    General appearance: Ill-appearing gentleman appeared in mild respiratory distress. Head: Normocephalic without any trauma Oropharynx: Mucous membranes are moist and pink  without any thrush or ulcers. Eyes: Pupils are equal and round reactive to light. Lymph nodes: No cervical, supraclavicular, inguinal or axillary lymphadenopathy.   Heart:regular rate and rhythm.  S1 and S2 without leg edema. Lung: Scattered rhonchi and wheezes noted. Abdomin: Soft, nontender, nondistended with good bowel sounds.  No hepatosplenomegaly. Musculoskeletal: No joint deformity or effusion.  Full range of motion noted. Neurological: No deficits noted on motor, sensory and  deep tendon reflex exam. Skin: No petechial rash or dryness.  Appeared moist.                Lab Results: Lab Results  Component Value Date   WBC 15.1 (H) 05/19/2022   HGB 7.5 (L) 05/19/2022   HCT 25.6 (L) 05/19/2022   MCV 80.5 05/19/2022   PLT 661 (H) 05/19/2022   PSA 0.84 01/16/2016     Chemistry      Component Value Date/Time   NA 139 05/19/2022 0005   NA 139 01/26/2022 0934   K 3.1 (L) 05/19/2022 0005   CL 109 05/19/2022 0005   CO2 19 (L) 05/19/2022 0005   BUN 8 05/19/2022 0005   BUN 11 01/26/2022 0934   CREATININE 1.06 05/19/2022 0005   CREATININE 0.81 04/08/2022 1312   CREATININE 0.80 01/16/2016 1052      Component Value Date/Time   CALCIUM 10.5 (H) 05/19/2022 0005   ALKPHOS 76 05/19/2022 0005   AST 13 (L) 05/19/2022 0005   AST 11 (L) 04/08/2022 1312   ALT 11 05/19/2022 0005   ALT 11 04/08/2022 1312   BILITOT 0.5 05/19/2022 0005   BILITOT 0.2 (L) 04/08/2022 1312        Impression and Plan:  71 year old man with:   1.  Stage IV clear-cell renal cell carcinoma with pulmonary and hepatic involvement diagnosed in 2021.  He has tolerated therapy poorly despite dose reduction and interruption of Inlyta.  Different treatment options were discussed including Lenvima among other options.  He has experienced rapid decline in his performance status which could be related to cancer progression in addition to other causes.  I recommended updating his staging scans before proceeding with any additional therapy and if his cancer continues to progress and his condition continues to decline transitioning to hospice could be recommended.     2.  Weakness and failure to thrive associated with tachycardia and shortness of breath: Appears to be multifactorial in nature could be related to his cancer progression versus other reversible causes.  I recommended urgent evaluation in the emergency department and possible hospitalization.  I recommend updating his staging  scans during his hospitalization which will help set some light about the nature of his symptoms.      3.  Goals of care and prognosis: His prognosis is poor and with his disease has progressed and I would recommend transitioning to hospice at this time.  This was discussed with the patient and his family accompanying him today   4.  Anemia: Related to malignancy and treatment.  His hemoglobin is slightly below his baseline and will benefit from transfusion.  5.  Hypercalcemia: His calcium was 10.5 although his albumin is low with corrected calcium is likely higher.  I anticipate improvement with hydration and possible repeat Zometa.   6.  Follow-up: In 4 weeks for repeat follow-up.   30  minutes were spent on this visit.  The time was dedicated to reviewing his disease status, laboratory data discussion, treatment choices and future plan of care review.  Zola Button, MD 6/13/202311:54 AM

## 2022-05-26 NOTE — Discharge Summary (Signed)
Physician Discharge Summary  Chad Cantu HBZ:[id number] DOB: [birth year] DOA: 05/25/2022  PCP: [name], MD  Admit date: 05/25/2022  Discharge date: 05/26/2022  Admitted From: Home Disposition:  Hospice [facility])  Recommendations for Outpatient Follow-up:  Follow up with PCP in 1-2 weeks Please obtain BMP/CBC in one week Patient is being discharged to hospice for comfort care.  Home Health: None Equipment/Devices:None  Discharge Condition: Fair CODE STATUS:DNR Diet recommendation: Regular diet.  Brief Summary: Chad Cantu is a 71 y.o. 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  He has had a rapid decline and when being evaluated by his oncologist, Dr. Alen Blew, today was noted to be severely weak.  His hemoglobin was low and calcium was 10.5.  He has referred him to the ED to further evaluation.  According to Dr. Hazeline Junker note, the patient prognosis is poor and he has recommended transitioning to hospice. The patient was confused and unable to participate in history.  He has forgotten that he has cancer. The patient's wife states that he has completely stopped eating but has been able to drink but even this has decreased.  She has been helping him to get up and around as he is unable to do it independently.  I have discussed plan of care and she does state that Dr. Alen Blew mention hospice.  Family has agreed for hospice.  Care transitioned to comfort care.  Patient is being discharged to [facility] for comfort care.   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  She states she would prefer DNR and comfort care .  She prefers that he be transitioned to hospice home if possible as she is unable to care for him in the state.  I have requested a TOC consult for residential hospice placement.  Patient is being discharged to [facility] for comfort care.  Discharge Instructions  Discharge Instructions     Call MD for:  difficulty breathing, headache or visual disturbances   Complete by: As directed    Call MD for:  persistant nausea and vomiting   Complete by: As directed    Diet - low sodium heart healthy   Complete by: As directed    Diet general   Complete by: As directed    Discharge instructions   Complete by: As directed    Patient is being discharged to hospice for comfort care.   Increase activity slowly   Complete by: As directed       Allergies as of 05/26/2022   No Known Allergies      Medication List     STOP taking these medications    atorvastatin 20 MG tablet Commonly known as: LIPITOR   axitinib 1 MG tablet Commonly known as: INLYTA   axitinib 5 MG tablet Commonly known as: INLYTA   Cabometyx 20 MG tablet Generic drug: cabozantinib   megestrol 40 MG/ML suspension Commonly known as: MEGACE   metFORMIN 500 MG tablet Commonly known as: GLUCOPHAGE   mirtazapine 7.5 MG tablet Commonly known as: REMERON       TAKE  these medications    acetaminophen 325 MG tablet Commonly known as: TYLENOL Take 2 tablets (650 mg total) by mouth every 6 (six) hours as needed for mild pain (or Fever >/= 101).   FeroSul 325 (65 FE) MG tablet Generic drug: ferrous sulfate TAKE 1 TABLET (325 MG TOTAL) BY MOUTH IN THE MORNING AND AT BEDTIME.   lisinopril-hydrochlorothiazide 10-12.5 MG tablet Commonly known as: ZESTORETIC TAKE 1 TABLET BY MOUTH DAILY.        Follow-up Information     [name], MD Follow up in 1 week(s).   Specialty: Internal Medicine Contact  information: [address] [place] [address] [phone]                No Known Allergies  Consultations: ONCOLOGY   Procedures/Studies: CT Angio Chest PE W and/or Wo Contrast  Result Date: 05/25/2022 CLINICAL DATA:  Renal cell carcinoma with liver and lung metastasis. Weakness. Dehydration. Malaise. Evaluate for pulmonary embolism. * Tracking Code: BO * EXAM: CT ANGIOGRAPHY CHEST WITH CONTRAST TECHNIQUE: Multidetector CT imaging of the chest was performed using the standard protocol during bolus administration of intravenous contrast. Multiplanar CT image reconstructions and MIPs were obtained to evaluate the vascular anatomy. RADIATION DOSE REDUCTION: This exam was performed according to the departmental dose-optimization program which includes automated exposure control, adjustment of the mA and/or kV according to patient size and/or use of iterative reconstruction technique. CONTRAST:  34m OMNIPAQUE IOHEXOL 350 MG/ML SOLN COMPARISON:  Chest radiograph earlier today.  Chest CT 02/25/2022. FINDINGS: Cardiovascular: The quality of this exam for evaluation of pulmonary embolism is moderate. Limitations include motion and suboptimal bolus timing. No embolism to the large segmental level. Aortic atherosclerosis. Mild cardiomegaly, without pericardial effusion. Pulmonary artery enlargement, outflow tract 3.2 cm Mediastinum/Nodes: Left low jugular/supraclavicular 9 mm node on 03/04 is unchanged. No mediastinal or hilar adenopathy. Esophageal fluid level on 24/4. Lungs/Pleura: No pleural fluid. Bilateral pulmonary nodules. A pleural-based posterior right upper lobe pulmonary nodule measures 9 mm on 55/6 versus 6 mm on the prior exam (when remeasured). Pleural-based medial right upper lobe pulmonary nodule measures 12 mm on 32/6 versus 11 mm on the prior (when remeasured). Posterior right apical pleural-based 11 mm nodule on 21/6 measured 9 mm on the prior exam (when remeasured).  Upper Abdomen: Motion degradation and bolus timing limit evaluation of known hepatic metastasis. An index right hepatic lobe pericholecystic lesion measures 2.6 x 2.6 cm on 86/4 and is similar to on the prior exam (when remeasured). Smaller satellite lesions of up to 11 mm just laterally are also not significantly changed. Normal imaged portions of the spleen, stomach, pancreas, adrenal glands, kidneys. Musculoskeletal: No acute osseous abnormality. Review of the MIP images confirms the above findings. IMPRESSION: 1. No pulmonary embolism with above limitations. 2. Progressive pulmonary metastasis compared to 02/25/2022. 3. Suboptimally evaluated but grossly similar hepatic metastasis. 4. Similar low left jugular/supraclavicular adenopathy, likely nodal metastasis. Electronically Signed   By: KAbigail MiyamotoM.D.   On: 05/25/2022 13:52   CT HEAD WO CONTRAST (5MM)  Result Date: 05/19/2022 CLINICAL DATA:  Weakness and fatigue. EXAM: CT HEAD WITHOUT CONTRAST TECHNIQUE: Contiguous axial images were obtained from the base of the skull through the vertex without intravenous contrast. RADIATION DOSE REDUCTION: This exam was performed according to the departmental dose-optimization program which includes automated exposure control, adjustment of the mA and/or kV according to patient size and/or use of iterative reconstruction technique. COMPARISON:  July 23, 2021 FINDINGS: Brain:  There is mild cerebral atrophy with widening of the extra-axial spaces and ventricular dilatation. There are areas of decreased attenuation within the white matter tracts of the supratentorial brain, consistent with microvascular disease changes. Vascular: No hyperdense vessel or unexpected calcification. Skull: Normal. Negative for fracture or focal lesion. Sinuses/Orbits: No acute finding. Other: A small, stable right frontal scalp lipoma is noted. IMPRESSION: 1. No acute intracranial abnormality. 2. Generalized cerebral atrophy with chronic  white matter small vessel ischemic changes. Electronically Signed   By: Virgina Norfolk M.D.   On: 05/19/2022 00:31   DG Chest Port 1 View  Result Date: 05/19/2022 CLINICAL DATA:  Shortness of breath EXAM: PORTABLE CHEST 1 VIEW COMPARISON:  02/25/2022 CT. FINDINGS: Cardiac shadow is within normal limits. Aortic calcifications are seen. Known pulmonary nodules on prior CT are not well appreciated on this exam. Lungs are well aerated and clear. No bony abnormality is seen. IMPRESSION: No acute abnormality noted. Electronically Signed   By: Inez Catalina M.D.   On: 05/19/2022 00:26      Subjective: Patient was seen and examined at bedside.  Overnight events noted.   Patient appears comfortable.  Patient is being discharged to [facility] for hospice care.  Discharge Exam: Vitals:   05/25/22 2126 05/25/22 2129  BP: 137/85   Pulse: (!) 104   Resp: 18   Temp:  99.6 F (37.6 C)  SpO2: 100%    Vitals:   05/25/22 1745 05/25/22 1800 05/25/22 2126 05/25/22 2129  BP: 133/84 140/86 137/85   Pulse: (!) 103 96 (!) 104   Resp: '19 16 18   '$ Temp:    99.6 F (37.6 C)  TempSrc:    Oral  SpO2: 99% 100% 100%     General: Pt is alert, awake, not in acute distress Cardiovascular: RRR, S1/S2 +, no rubs, no gallops Respiratory: CTA bilaterally, no wheezing, no rhonchi Abdominal: Soft, NT, ND, bowel sounds + Extremities: no edema, no cyanosis    The results of significant diagnostics from this hospitalization (including imaging, microbiology, ancillary and laboratory) are listed below for reference.     Microbiology: Recent Results (from the past 240 hour(s))  Blood culture (routine x 2)     Status: Abnormal   Collection Time: 05/19/22 12:35 AM   Specimen: BLOOD  Result Value Ref Range Status   Specimen Description   Final    BLOOD RIGHT ANTECUBITAL Performed at [facility] [address], [place], [place] [address]    Special Requests   Final    BOTTLES DRAWN AEROBIC  AND ANAEROBIC Blood Culture results may not be optimal due to an inadequate volume of blood received in culture bottles Performed at [facility] [address], [place], [place] [address]    Culture  Setup Time   Final    GRAM POSITIVE COCCI IN CLUSTERS AEROBIC BOTTLE ONLY CRITICAL RESULT CALLED TO, READ BACK BY AND VERIFIED WITH: L MANUAL,RN'@0338'$  05/20/22 [place]    Culture (A)  Final    STAPHYLOCOCCUS EPIDERMIDIS THE SIGNIFICANCE OF ISOLATING THIS ORGANISM FROM A SINGLE SET OF BLOOD CULTURES WHEN MULTIPLE SETS ARE DRAWN IS UNCERTAIN. PLEASE NOTIFY THE MICROBIOLOGY DEPARTMENT WITHIN ONE WEEK IF SPECIATION AND SENSITIVITIES ARE REQUIRED. Performed at [facility], [place] [address], [place],  [address]    Report Status 05/22/2022 FINAL  Final  Blood culture (routine x 2)     Status: None   Collection Time: 05/19/22 12:35 AM   Specimen: BLOOD  Result Value Ref Range Status   Specimen Description   Final    BLOOD LEFT ANTECUBITAL Performed at [facility] [address] [place], [place] [address]    Special Requests   Final    BOTTLES DRAWN AEROBIC AND ANAEROBIC Blood Culture adequate volume Performed at [facility] [address], [place], [place] [address]    Culture   Final    NO GROWTH 5 DAYS Performed at [facility], [place] [address], [place], [place] [address]    Report Status 05/24/2022 FINAL  Final  Blood Culture ID Panel (Reflexed)     Status: Abnormal   Collection Time: 05/19/22 12:35 AM  Result Value Ref Range Status   Enterococcus faecalis NOT DETECTED NOT DETECTED Final   Enterococcus Faecium NOT DETECTED NOT DETECTED Final   Listeria monocytogenes NOT DETECTED NOT DETECTED Final   Staphylococcus species DETECTED (A) NOT DETECTED Final    Comment: CRITICAL RESULT CALLED TO, READ BACK BY AND VERIFIED WITH: L MANUAL,RN'@0339'$  05/20/22 [place]    Staphylococcus aureus (BCID) NOT DETECTED NOT DETECTED  Final   Staphylococcus epidermidis DETECTED (A) NOT DETECTED Final    Comment: Methicillin (oxacillin) resistant coagulase negative staphylococcus. Possible blood culture contaminant (unless isolated from more than one blood culture draw or clinical case suggests pathogenicity). No antibiotic treatment is indicated for blood  culture contaminants. CRITICAL RESULT CALLED TO, READ BACK BY AND VERIFIED WITH:  L MANUAL,RN'@0339'$  05/20/22 [place]    Staphylococcus lugdunensis NOT DETECTED NOT DETECTED Final   Streptococcus species NOT DETECTED NOT DETECTED Final   Streptococcus agalactiae NOT DETECTED NOT DETECTED Final   Streptococcus pneumoniae NOT DETECTED NOT DETECTED Final   Streptococcus pyogenes NOT DETECTED NOT DETECTED Final   A.calcoaceticus-baumannii NOT DETECTED NOT DETECTED Final   Bacteroides fragilis NOT DETECTED NOT DETECTED Final   Enterobacterales NOT DETECTED NOT DETECTED Final   Enterobacter cloacae complex NOT DETECTED NOT DETECTED Final   Escherichia coli NOT DETECTED NOT DETECTED Final   Klebsiella aerogenes NOT DETECTED NOT DETECTED Final   Klebsiella oxytoca NOT DETECTED NOT DETECTED Final   Klebsiella pneumoniae NOT DETECTED NOT DETECTED Final   Proteus species NOT DETECTED NOT DETECTED Final   Salmonella species NOT DETECTED NOT DETECTED Final   Serratia marcescens NOT DETECTED NOT DETECTED Final   Haemophilus influenzae NOT DETECTED NOT DETECTED Final   Neisseria meningitidis NOT DETECTED NOT DETECTED Final   Pseudomonas aeruginosa NOT DETECTED NOT DETECTED Final   Stenotrophomonas maltophilia NOT DETECTED NOT DETECTED Final   Candida albicans NOT DETECTED NOT DETECTED Final   Candida auris NOT DETECTED NOT DETECTED Final   Candida glabrata NOT DETECTED NOT DETECTED Final   Candida krusei NOT DETECTED NOT DETECTED Final   Candida parapsilosis NOT DETECTED NOT DETECTED Final   Candida tropicalis NOT DETECTED NOT DETECTED Final   Cryptococcus neoformans/gattii NOT  DETECTED NOT DETECTED Final   Methicillin resistance mecA/C DETECTED (A) NOT DETECTED Final    Comment: CRITICAL RESULT CALLED TO, READ BACK BY AND VERIFIED WITH: L MANUAL,RN'@0339'$  05/20/22 [place] Performed at [facility], [address], [place], [place] [address]   Resp panel by RT-PCR (RSV, Flu A&B, Covid) Anterior Nasal Swab     Status: None   Collection Time: 05/25/22  1:08 PM   Specimen: Anterior Nasal Swab  Result Value Ref Range Status   SARS Coronavirus 2 by RT PCR NEGATIVE NEGATIVE Final    Comment: (NOTE) SARS-CoV-2 target nucleic acids are NOT DETECTED.  The SARS-CoV-2 RNA is generally detectable in upper respiratory specimens during the acute phase of infection. The lowest concentration of SARS-CoV-2 viral copies this assay can detect is 138 copies/mL. A negative result does not preclude SARS-Cov-2 infection and should not be used as the sole basis for treatment or other patient management decisions. A negative result may occur with  improper specimen collection/handling, submission of specimen other than nasopharyngeal swab, presence of viral mutation(s) within the areas targeted by this assay, and inadequate number of viral copies(<138 copies/mL). A negative result must be combined with clinical observations, patient history, and epidemiological information. The expected result is Negative.  Fact Sheet for Patients:  EntrepreneurPulse.com.au  Fact Sheet for Healthcare Providers:  IncredibleEmployment.be  This test is no t yet approved or cleared by the [place] FDA and  has been authorized for detection and/or diagnosis of SARS-CoV-2 by FDA under an Emergency Use Authorization (EUA). This EUA will remain  in effect (meaning this test can be used) for the duration of the COVID-19 declaration under Section 564(b)(1) of the Act, 21 U.S.C.section 360bbb-3(b)(1), unless the authorization is terminated  or revoked sooner.        Influenza A by PCR NEGATIVE NEGATIVE Final   Influenza B by PCR NEGATIVE NEGATIVE Final    Comment: (NOTE) The Xpert Xpress SARS-CoV-2/FLU/RSV plus assay is intended as an aid in the diagnosis of influenza from Nasopharyngeal swab specimens and should not be used as a sole basis for treatment. Nasal washings and aspirates are unacceptable for Xpert Xpress SARS-CoV-2/FLU/RSV testing.  Fact Sheet for Patients: EntrepreneurPulse.com.au  Fact Sheet for Healthcare Providers: IncredibleEmployment.be  This test is not yet approved or cleared by the [place] FDA and has been authorized for detection and/or diagnosis of SARS-CoV-2 by FDA under an Emergency Use Authorization (EUA). This EUA will remain in effect (meaning this test can be used) for the duration of the COVID-19 declaration under Section 564(b)(1) of the Act, 21 U.S.C. section 360bbb-3(b)(1), unless the authorization is terminated or revoked.     Resp Syncytial Virus by PCR NEGATIVE NEGATIVE Final    Comment: (NOTE) Fact Sheet for Patients: EntrepreneurPulse.com.au  Fact Sheet for Healthcare Providers: IncredibleEmployment.be  This test is not yet approved or cleared by the [place] FDA and has been authorized for detection and/or diagnosis of SARS-CoV-2 by FDA under an Emergency Use Authorization (EUA). This EUA will remain in effect (meaning this test can be used) for the duration of the COVID-19 declaration under Section 564(b)(1) of the Act, 21 U.S.C. section 360bbb-3(b)(1), unless the authorization is terminated or revoked.  Performed at [facility], [place] [address] [place], [place] [address]   Blood culture (routine x 2)     Status: None (Preliminary result)   Collection Time: 05/25/22  1:08 PM   Specimen: BLOOD  Result Value Ref Range Status   Specimen Description   Final    BLOOD SITE NOT  SPECIFIED Performed at [facility] [address], [place], [place] [address]    Special Requests   Final    BOTTLES DRAWN AEROBIC AND ANAEROBIC Blood Culture adequate volume Performed at [facility] [address], [place], [place] [address]    Culture   Final    NO GROWTH < 24 HOURS Performed at [facility] [address] [place], [place] [address]    Report Status PENDING  Incomplete  Blood culture (routine x 2)     Status: None (Preliminary  result)   Collection Time: 05/25/22  2:16 PM   Specimen: BLOOD  Result Value Ref Range Status   Specimen Description   Final    BLOOD SITE NOT SPECIFIED Performed at [facility] [address], [place], [place] [address]    Special Requests   Final    BOTTLES DRAWN AEROBIC AND ANAEROBIC Blood Culture adequate volume Performed at [facility] [address], [place], [place] [address]    Culture   Final    NO GROWTH < 24 HOURS Performed at [facility] [address], [place], [place] [address]    Report Status PENDING  Incomplete     Labs: BNP (last 3 results) Recent Labs    05/25/22 1308  BNP 32.4   Basic Metabolic Panel: Recent Labs  Lab 05/25/22 1141 05/25/22 1308 05/25/22 1530  NA 135  --  135  K 4.4  --  4.2  CL 102  --  102  CO2 24  --  24  GLUCOSE 106*  --  91  BUN 17  --  16  CREATININE 0.86  --  0.75  CALCIUM 11.6*  --  10.4*  MG  --  1.9  --    Liver Function Tests: Recent Labs  Lab 05/25/22 1141 05/25/22 1530  AST 11* 14*  ALT 9 11  ALKPHOS 65 56  BILITOT 0.3 0.5  PROT 9.0* 7.6  ALBUMIN 2.8* 1.6*   No results for input(s): "LIPASE", "AMYLASE" in the last 168 hours. No results for input(s): "AMMONIA" in the last 168 hours. CBC: Recent Labs  Lab 05/25/22 1141 05/25/22 1513  WBC 14.4* 12.9*  NEUTROABS 9.0* 8.8*  HGB 7.6* 7.1*  HCT 25.5* 24.1*  MCV 78.2* 80.3  PLT 692* 603*   Cardiac  Enzymes: No results for input(s): "CKTOTAL", "CKMB", "CKMBINDEX", "TROPONINI" in the last 168 hours. BNP: Invalid input(s): "POCBNP" CBG: No results for input(s): "GLUCAP" in the last 168 hours. D-Dimer No results for input(s): "DDIMER" in the last 72 hours. Hgb A1c No results for input(s): "HGBA1C" in the last 72 hours. Lipid Profile No results for input(s): "CHOL", "HDL", "LDLCALC", "TRIG", "CHOLHDL", "LDLDIRECT" in the last 72 hours. Thyroid function studies Recent Labs    05/25/22 1140  TSH 3.432   Anemia work up No results for input(s): "VITAMINB12", "FOLATE", "FERRITIN", "TIBC", "IRON", "RETICCTPCT" in the last 72 hours. Urinalysis    Component Value Date/Time   COLORURINE YELLOW 05/25/2022 1415   APPEARANCEUR CLEAR 05/25/2022 1415   APPEARANCEUR Clear 05/02/2017 1702   LABSPEC 1.029 05/25/2022 1415   PHURINE 7.0 05/25/2022 1415   GLUCOSEU NEGATIVE 05/25/2022 1415   HGBUR NEGATIVE 05/25/2022 1415   BILIRUBINUR NEGATIVE 05/25/2022 1415   BILIRUBINUR Negative 05/02/2017 1702   KETONESUR NEGATIVE 05/25/2022 1415   PROTEINUR NEGATIVE 05/25/2022 1415   NITRITE NEGATIVE 05/25/2022 1415   LEUKOCYTESUR NEGATIVE 05/25/2022 1415   Sepsis Labs Recent Labs  Lab 05/25/22 1141 05/25/22 1513  WBC 14.4* 12.9*   Microbiology Recent Results (from the past 240 hour(s))  Blood culture (routine x 2)     Status: Abnormal   Collection Time: 05/19/22 12:35 AM   Specimen: BLOOD  Result Value Ref Range Status   Specimen Description   Final    BLOOD RIGHT ANTECUBITAL Performed at [facility], [place] [address], [place],  [address]    Special Requests   Final    BOTTLES DRAWN AEROBIC AND ANAEROBIC Blood Culture results may not  be optimal due to an inadequate volume of blood received in culture bottles Performed at [facility] [address], [place], [place] [address]    Culture  Setup Time   Final    GRAM POSITIVE COCCI IN  CLUSTERS AEROBIC BOTTLE ONLY CRITICAL RESULT CALLED TO, READ BACK BY AND VERIFIED WITH: L MANUAL,RN'@0338'$  05/20/22 [place]    Culture (A)  Final    STAPHYLOCOCCUS EPIDERMIDIS THE SIGNIFICANCE OF ISOLATING THIS ORGANISM FROM A SINGLE SET OF BLOOD CULTURES WHEN MULTIPLE SETS ARE DRAWN IS UNCERTAIN. PLEASE NOTIFY THE MICROBIOLOGY DEPARTMENT WITHIN ONE WEEK IF SPECIATION AND SENSITIVITIES ARE REQUIRED. Performed at [facility], [place] [address] [place], [place] [address]    Report Status 05/22/2022 FINAL  Final  Blood culture (routine x 2)     Status: None   Collection Time: 05/19/22 12:35 AM   Specimen: BLOOD  Result Value Ref Range Status   Specimen Description   Final    BLOOD LEFT ANTECUBITAL Performed at [facility] [address], [place], [place] [address]    Special Requests   Final    BOTTLES DRAWN AEROBIC AND ANAEROBIC Blood Culture adequate volume Performed at [facility] [address], [place], [place] [address]    Culture   Final    NO GROWTH 5 DAYS Performed at [facility], [place] [address], [place], [place] [address]    Report Status 05/24/2022 FINAL  Final  Blood Culture ID Panel (Reflexed)     Status: Abnormal   Collection Time: 05/19/22 12:35 AM  Result Value Ref Range Status   Enterococcus faecalis NOT DETECTED NOT DETECTED Final   Enterococcus Faecium NOT DETECTED NOT DETECTED Final   Listeria monocytogenes NOT DETECTED NOT DETECTED Final   Staphylococcus species DETECTED (A) NOT DETECTED Final    Comment: CRITICAL RESULT CALLED TO, READ BACK BY AND VERIFIED WITH: L MANUAL,RN'@0339'$  05/20/22 [place]    Staphylococcus aureus (BCID) NOT DETECTED NOT DETECTED Final   Staphylococcus epidermidis DETECTED (A) NOT DETECTED Final    Comment: Methicillin (oxacillin) resistant coagulase negative staphylococcus. Possible blood culture contaminant (unless isolated from more than one blood culture draw or clinical case suggests  pathogenicity). No antibiotic treatment is indicated for blood  culture contaminants. CRITICAL RESULT CALLED TO, READ BACK BY AND VERIFIED WITH:  L MANUAL,RN'@0339'$  05/20/22 [place]    Staphylococcus lugdunensis NOT DETECTED NOT DETECTED Final   Streptococcus species NOT DETECTED NOT DETECTED Final   Streptococcus agalactiae NOT DETECTED NOT DETECTED Final   Streptococcus pneumoniae NOT DETECTED NOT DETECTED Final   Streptococcus pyogenes NOT DETECTED NOT DETECTED Final   A.calcoaceticus-baumannii NOT DETECTED NOT DETECTED Final   Bacteroides fragilis NOT DETECTED NOT DETECTED Final   Enterobacterales NOT DETECTED NOT DETECTED Final   Enterobacter cloacae complex NOT DETECTED NOT DETECTED Final   Escherichia coli NOT DETECTED NOT DETECTED Final   Klebsiella aerogenes NOT DETECTED NOT DETECTED Final   Klebsiella oxytoca NOT DETECTED NOT DETECTED Final   Klebsiella pneumoniae NOT DETECTED NOT DETECTED Final   Proteus species NOT DETECTED NOT DETECTED Final   Salmonella species NOT DETECTED NOT DETECTED Final   Serratia marcescens NOT DETECTED NOT DETECTED Final   Haemophilus influenzae NOT DETECTED NOT DETECTED Final   Neisseria meningitidis NOT DETECTED NOT DETECTED Final   Pseudomonas aeruginosa NOT DETECTED NOT DETECTED Final   Stenotrophomonas maltophilia NOT DETECTED NOT DETECTED Final   Candida albicans NOT DETECTED NOT DETECTED Final   Candida auris  NOT DETECTED NOT DETECTED Final   Candida glabrata NOT DETECTED NOT DETECTED Final   Candida krusei NOT DETECTED NOT DETECTED Final   Candida parapsilosis NOT DETECTED NOT DETECTED Final   Candida tropicalis NOT DETECTED NOT DETECTED Final   Cryptococcus neoformans/gattii NOT DETECTED NOT DETECTED Final   Methicillin resistance mecA/C DETECTED (A) NOT DETECTED Final    Comment: CRITICAL RESULT CALLED TO, READ BACK BY AND VERIFIED WITH: L MANUAL,RN'@0339'$  05/20/22 [place] Performed at [facility], [address], [place], [place]  [address]   Resp panel by RT-PCR (RSV, Flu A&B, Covid) Anterior Nasal Swab     Status: None   Collection Time: 05/25/22  1:08 PM   Specimen: Anterior Nasal Swab  Result Value Ref Range Status   SARS Coronavirus 2 by RT PCR NEGATIVE NEGATIVE Final    Comment: (NOTE) SARS-CoV-2 target nucleic acids are NOT DETECTED.  The SARS-CoV-2 RNA is generally detectable in upper respiratory specimens during the acute phase of infection. The lowest concentration of SARS-CoV-2 viral copies this assay can detect is 138 copies/mL. A negative result does not preclude SARS-Cov-2 infection and should not be used as the sole basis for treatment or other patient management decisions. A negative result may occur with  improper specimen collection/handling, submission of specimen other than nasopharyngeal swab, presence of viral mutation(s) within the areas targeted by this assay, and inadequate number of viral copies(<138 copies/mL). A negative result must be combined with clinical observations, patient history, and epidemiological information. The expected result is Negative.  Fact Sheet for Patients:  EntrepreneurPulse.com.au  Fact Sheet for Healthcare Providers:  IncredibleEmployment.be  This test is no t yet approved or cleared by the [place] FDA and  has been authorized for detection and/or diagnosis of SARS-CoV-2 by FDA under an Emergency Use Authorization (EUA). This EUA will remain  in effect (meaning this test can be used) for the duration of the COVID-19 declaration under Section 564(b)(1) of the Act, 21 U.S.C.section 360bbb-3(b)(1), unless the authorization is terminated  or revoked sooner.       Influenza A by PCR NEGATIVE NEGATIVE Final   Influenza B by PCR NEGATIVE NEGATIVE Final    Comment: (NOTE) The Xpert Xpress SARS-CoV-2/FLU/RSV plus assay is intended as an aid in the diagnosis of influenza from Nasopharyngeal swab specimens and should not  be used as a sole basis for treatment. Nasal washings and aspirates are unacceptable for Xpert Xpress SARS-CoV-2/FLU/RSV testing.  Fact Sheet for Patients: EntrepreneurPulse.com.au  Fact Sheet for Healthcare Providers: IncredibleEmployment.be  This test is not yet approved or cleared by the [place] FDA and has been authorized for detection and/or diagnosis of SARS-CoV-2 by FDA under an Emergency Use Authorization (EUA). This EUA will remain in effect (meaning this test can be used) for the duration of the COVID-19 declaration under Section 564(b)(1) of the Act, 21 U.S.C. section 360bbb-3(b)(1), unless the authorization is terminated or revoked.     Resp Syncytial Virus by PCR NEGATIVE NEGATIVE Final    Comment: (NOTE) Fact Sheet for Patients: EntrepreneurPulse.com.au  Fact Sheet for Healthcare Providers: IncredibleEmployment.be  This test is not yet approved or cleared by the [place] FDA and has been authorized for detection and/or diagnosis of SARS-CoV-2 by FDA under an Emergency Use Authorization (EUA). This EUA will remain in effect (meaning this test can be used) for the duration of the COVID-19 declaration under Section 564(b)(1) of the Act, 21 U.S.C. section 360bbb-3(b)(1), unless the authorization is terminated or revoked.  Performed  at [facility], [place] [address] [place], [place] [address]   Blood culture (routine x 2)     Status: None (Preliminary result)   Collection Time: 05/25/22  1:08 PM   Specimen: BLOOD  Result Value Ref Range Status   Specimen Description   Final    BLOOD SITE NOT SPECIFIED Performed at [facility] [address], [place], [place] [address]    Special Requests   Final    BOTTLES DRAWN AEROBIC AND ANAEROBIC Blood Culture adequate volume Performed at [facility] [address], [place],  [place] [address]    Culture   Final    NO GROWTH < 24 HOURS Performed at [facility] [address], [place], [place] [address]    Report Status PENDING  Incomplete  Blood culture (routine x 2)     Status: None (Preliminary result)   Collection Time: 05/25/22  2:16 PM   Specimen: BLOOD  Result Value Ref Range Status   Specimen Description   Final    BLOOD SITE NOT SPECIFIED Performed at [facility] [address] [place], [place] [address]    Special Requests   Final    BOTTLES DRAWN AEROBIC AND ANAEROBIC Blood Culture adequate volume Performed at [facility] [address], [place], [place] [address]    Culture   Final    NO GROWTH < 24 HOURS Performed at [facility] [address], [place], [place] [address]    Report Status PENDING  Incomplete     Time coordinating discharge: Over 30 minutes  SIGNED:   Shawna Clamp, MD  [facility] 05/26/2022, 4:22 PM Pager   If 7PM-7AM, please contact night-coverage

## 2022-05-26 NOTE — Discharge Instructions (Signed)
Patient is being discharged to hospice for comfort care.

## 2022-05-26 NOTE — TOC Initial Note (Signed)
Transition of Care [facility]) - Initial/Assessment Note    Patient Details  Name: Chad Cantu MRN: [id number] Date of Birth: 09/29/1951  [facility]) CM/SW Contact:    Lynnell Catalan, RN Phone Number: 05/26/2022, 2:01 PM  Clinical Narrative:                 [facility] consult for residential hospice. Spoke with pt and wife at bedside. Choice offered for residential hospice and [facility] chosen. [place] liaison contacted for referral.            Activities of Daily Living Home Assistive Devices/Equipment: Eyeglasses, Environmental consultant (specify type), Wheelchair ADL Screening (condition at time of admission) Patient's cognitive ability adequate to safely complete daily activities?: Yes Is the patient deaf or have difficulty hearing?: No Does the patient have difficulty seeing, even when wearing glasses/contacts?: No Does the patient have difficulty concentrating, remembering, or making decisions?: Yes (trouble concentrating and remembering) Patient able to express need for assistance with ADLs?: Yes Does the patient have difficulty dressing or bathing?: Yes Independently performs ADLs?: No Communication: Independent Dressing (OT): Needs assistance Is this a change from baseline?: Pre-admission baseline Grooming: Needs assistance Is this a change from baseline?: Pre-admission baseline Feeding: Independent Bathing: Needs assistance Is this a change from baseline?: Pre-admission baseline Toileting: Needs assistance Is this a change from baseline?: Pre-admission baseline In/Out Bed: Needs assistance Is this a change from baseline?: Pre-admission baseline Walks in Home: Needs assistance Is this a change from baseline?: Pre-admission baseline Does the patient have difficulty walking or climbing stairs?: Yes Weakness of Legs: Both Weakness of Arms/Hands: Both  Permission Sought/Granted                  Emotional Assessment              Admission diagnosis:   Hypercalcemia [E83.52] Dehydration [E86.0] 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 [E83.52] Generalized weakness [R53.1] Symptomatic anemia [D64.9] Fatigue, unspecified type [R53.83] Patient Active Problem List   Diagnosis Date Noted   Hypercalcemia of malignancy 05/25/2022   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   DNR (do not resuscitate) 05/25/2022   COVID-19 08/25/2021   Symptomatic anemia 02/18/2021   History of recent fall 12/25/2020   Hypercalcemia 12/22/2020   Metastatic renal cell carcinoma ([place]) 12/09/2020   Goals of care, counseling/discussion 12/09/2020   AKI (acute kidney injury) ([place]) 12/09/2020   Leukocytosis 12/09/2020   Alcohol dependence with uncomplicated withdrawal ([place]) 11/16/2020   Near syncope 09/20/2020   Dehydration 09/20/2020   Alcohol abuse 09/20/2020   Alcohol use disorder, severe, dependence ([place])    Memory difficulty 05/02/2017   Rash, skin 05/02/2017   Overactive bladder 05/02/2017   Alcohol use disorder 05/01/2017   HLD (hyperlipidemia) 04/09/2015   Essential hypertension 04/08/2015   Tobacco abuse 04/08/2015   PCP:  [name], MD Pharmacy:   [facility] [address], [place] [address] Phone: [phone] Fax: [facility] [place], [place] [address] [place] [address] Phone: [phone] Fax: [phone]  [place], [place] [address] Phone: [phone] Fax: [id number] [place], [place] - [address] [place] [address] Phone: [phone]  Fax: [phone]  [place] [address] Phone:  [phone] Fax: [phone]     Social Determinants of Health (SDOH) Interventions    Readmission Risk Interventions     No data to display

## 2022-05-26 NOTE — Progress Notes (Signed)
Report given to [place].

## 2022-05-26 NOTE — Progress Notes (Signed)
Manufacturing engineer [facility])  Referral received for [facility], he is approved and we have a bed to offer today.  His wife must complete necessary consents, then transport can be arranged.  RN staff, you may call report at any time to [phone] at any time, room is assigned when report is called.  Thank you, Venia Carbon DNP, RN [facility] Liaison

## 2022-05-27 ENCOUNTER — Encounter: Payer: Self-pay | Admitting: Oncology

## 2022-05-27 ENCOUNTER — Ambulatory Visit: Payer: Medicare Other | Admitting: Internal Medicine

## 2022-05-27 ENCOUNTER — Telehealth: Payer: Self-pay | Admitting: *Deleted

## 2022-05-27 NOTE — Telephone Encounter (Signed)
Mrs Chad Cantu left a message requesting the status of Mr Chad Cantu's cancer. She says Dr Alen Blew said the chemo wasn't working, but they are wanting more information. What is the plan?

## 2022-05-27 NOTE — Telephone Encounter (Signed)
Spoke with Mrs Cajamarca. She was just wanting to know if the cancer had grown and is that why he was declining. Reviewed CT from hospital with her and the increase in size of pulmonary nodules. That clarified things for her . States he went to [facility]. Will keep [place] posted.

## 2022-05-29 LAB — TYPE AND SCREEN
ABO/RH(D): O POS
Antibody Screen: NEGATIVE
Unit division: 0

## 2022-05-29 LAB — BPAM RBC
Blood Product Expiration Date: 202307082359
Unit Type and Rh: 5100

## 2022-05-30 LAB — CULTURE, BLOOD (ROUTINE X 2)
Culture: NO GROWTH
Culture: NO GROWTH
Special Requests: ADEQUATE
Special Requests: ADEQUATE

## 2022-06-05 IMAGING — MR MR ABDOMEN WO/W CM
18 series · 48 of 48 positions shown · IV contrast (gadavist)
Comparison: CT scan 11/19/2020

CLINICAL DATA: Evaluate left renal lesions seen CT scan.

EXAM:
MRI ABDOMEN WITHOUT AND WITH CONTRAST
TECHNIQUE: Multiplanar multisequence MR imaging of the abdomen was performed
both before and after the administration of intravenous contrast.
CONTRAST:  7.5mL GADAVIST GADOBUTROL 1 MMOL/ML IV SOLN

[Series 4: cor haste · coronal · 6.0mm · 1.19mm/px · 2 of 35 slices shown]
[im 1/35]
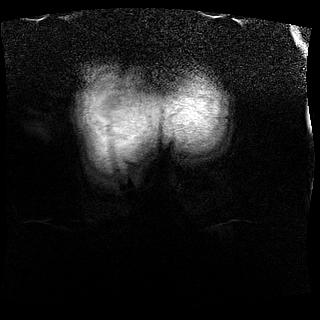
[im 35/35]
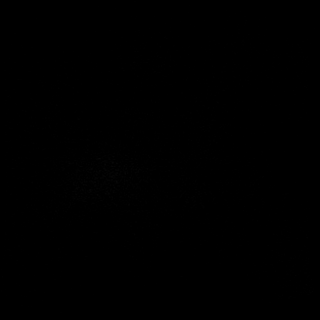

[Series 5: ax haste · axial · 6.0mm · 1.19mm/px · z∈[-95,+150]mm · 2 of 35 slices shown]
[im 1/35]
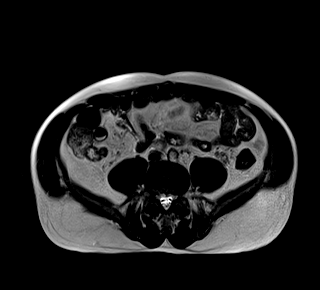
[im 35/35]
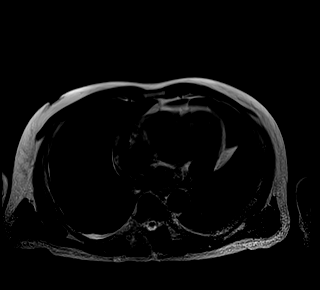

[Series 8: T2 fat-sat · axial · 6.0mm · 1.19mm/px · z∈[-83,+154]mm · 2 of 34 slices shown]
[im 1/34]
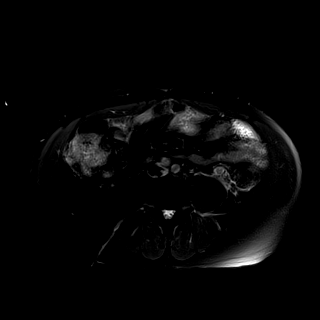
[im 34/34]
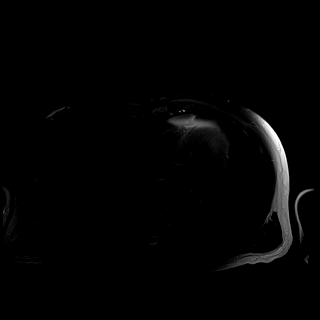

[Series 9: t1_vibe_opp-in_tra_p4_bh · axial · 3.0mm · 1.19mm/px · z∈[-79,+134]mm · 3 of 72 slices shown (1 of 2)]
[im 1/72]
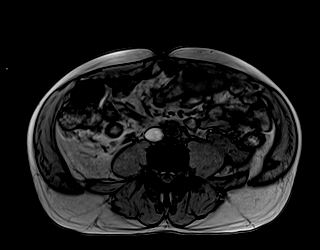
[im 36/72]
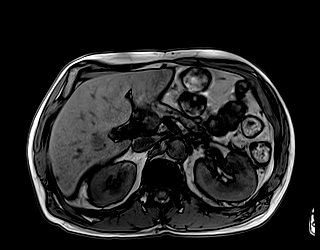
[im 72/72]
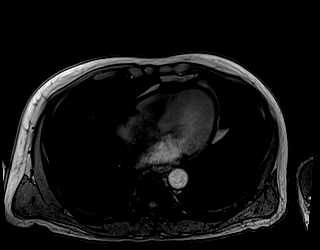

[Series 9: t1_vibe_opp-in_tra_p4_bh · axial · 3.0mm · 1.19mm/px · z∈[-79,+134]mm · 3 of 72 slices shown (2 of 2)]
[im 1/72]
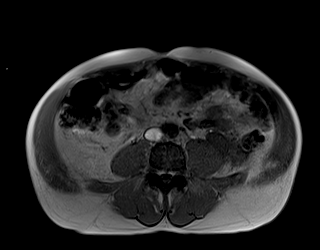
[im 36/72]
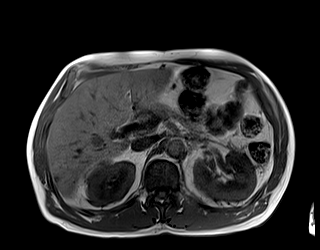
[im 72/72]
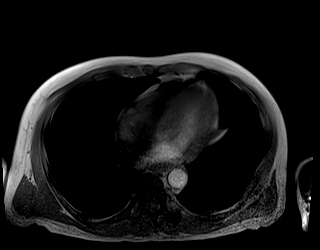

[Series 10: DWI · axial · 6.0mm · 1.42mm/px · z∈[-83,+154]mm · 4 of 102 slices shown (1 of 2)]
[im 1/102]
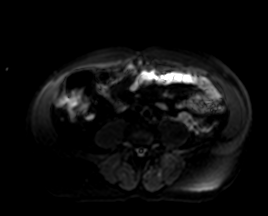
[im 34/102]
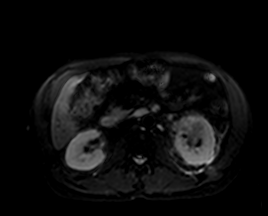
[im 68/102]
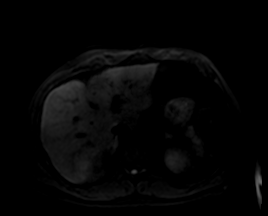
[im 102/102]
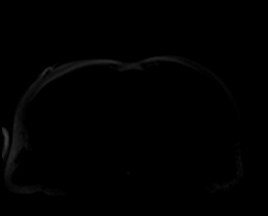

[Series 11: DWI · axial · 6.0mm · 1.42mm/px · 1 of 34 slices shown (2 of 2)]
[im 1/34]
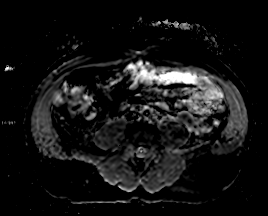

[Series 12: bSSFP · axial · 6.0mm · 0.74mm/px · 1 of 35 slices shown]
[im 1/35]
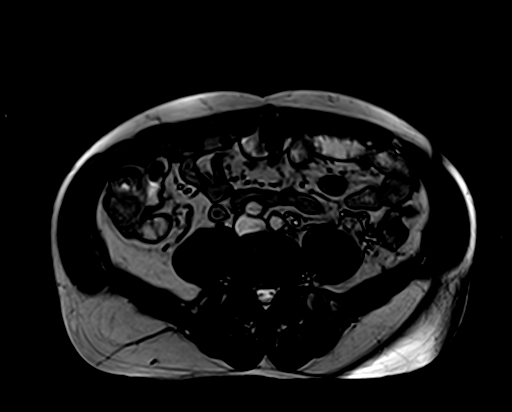

[Series 13: t1_vibe_fs_tra_p4_bh_pre · axial · 3.0mm · 1.19mm/px · z∈[-98,+163]mm · 3 of 88 slices shown]
[im 1/88]
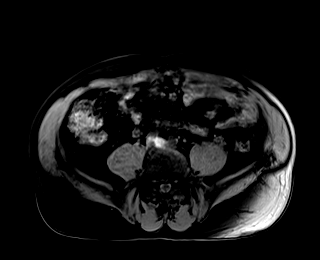
[im 44/88]
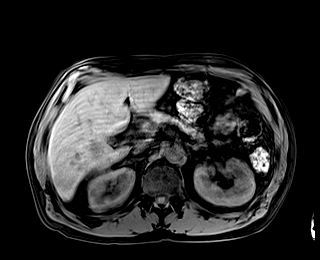
[im 88/88]
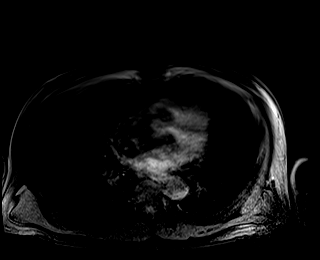

[Series 15: t1_vibe_fs_tra_p4_bh_post · axial · 3.0mm · 1.19mm/px · z∈[-98,+163]mm · 3 of 88 slices shown (1 of 4)]
[im 1/88]
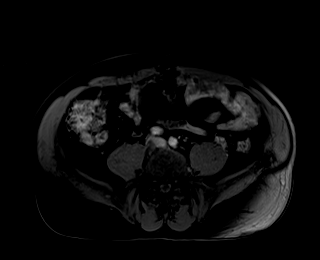
[im 44/88]
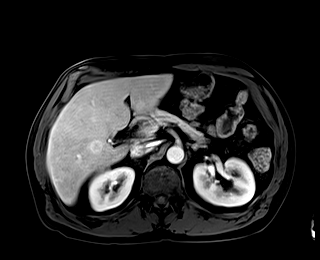
[im 88/88]
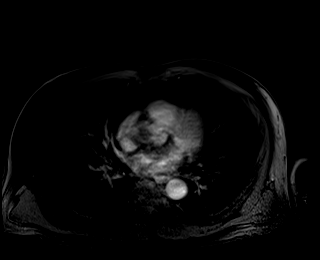

[Series 16: t1_vibe_fs_tra_p4_bh_post_sub · axial · 3.0mm · 1.19mm/px · z∈[-98,+163]mm · 3 of 88 slices shown (1 of 4)]
[im 1/88]
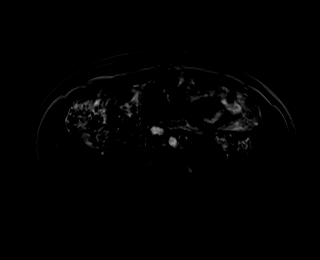
[im 44/88]
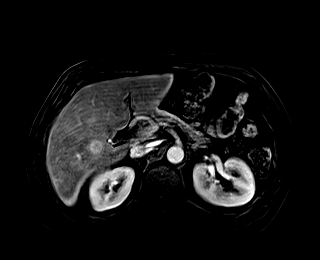
[im 88/88]
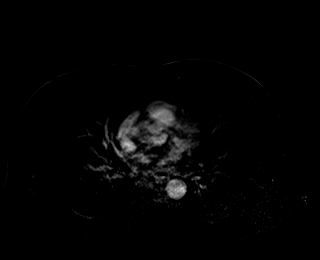

[Series 17: t1_vibe_fs_tra_p4_bh_post · axial · 3.0mm · 1.19mm/px · z∈[-98,+163]mm · 3 of 88 slices shown (2 of 4)]
[im 1/88]
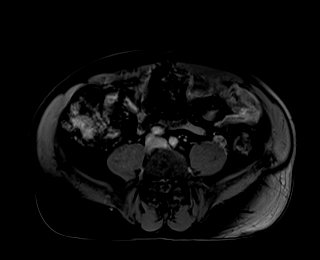
[im 44/88]
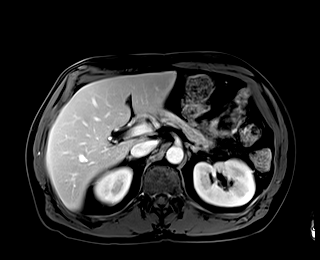
[im 88/88]
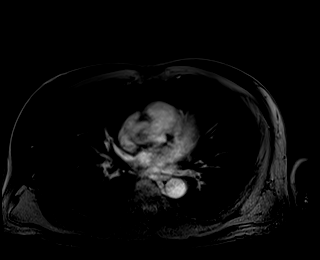

[Series 18: t1_vibe_fs_tra_p4_bh_post_sub · axial · 3.0mm · 1.19mm/px · z∈[-98,+163]mm · 3 of 88 slices shown (2 of 4)]
[im 1/88]
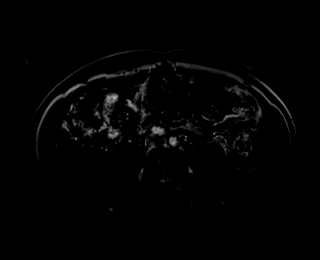
[im 44/88]
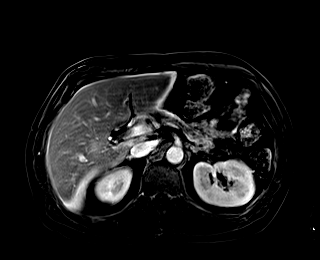
[im 88/88]
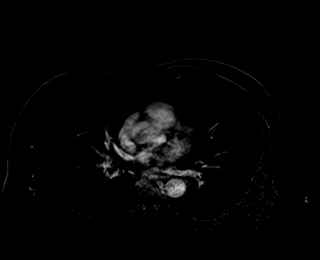

[Series 19: t1_vibe_fs_tra_p4_bh_post · axial · 3.0mm · 1.19mm/px · z∈[-98,+163]mm · 3 of 88 slices shown (3 of 4)]
[im 1/88]
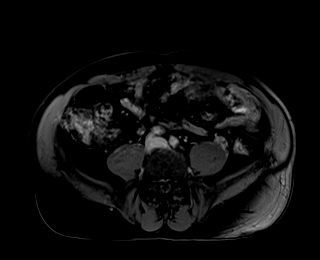
[im 44/88]
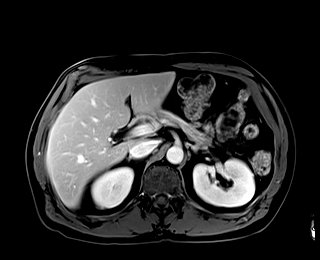
[im 88/88]
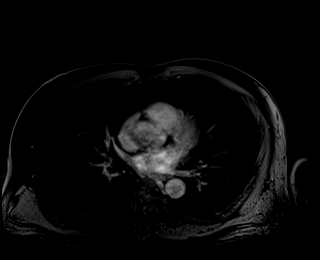

[Series 20: t1_vibe_fs_tra_p4_bh_post_sub · axial · 3.0mm · 1.19mm/px · z∈[-98,+163]mm · 3 of 88 slices shown (3 of 4)]
[im 1/88]
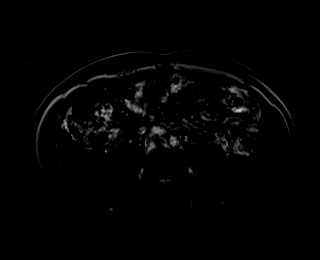
[im 44/88]
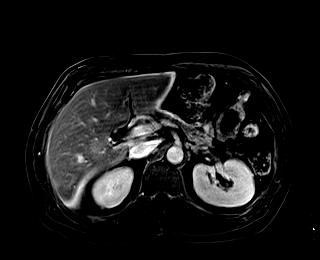
[im 88/88]
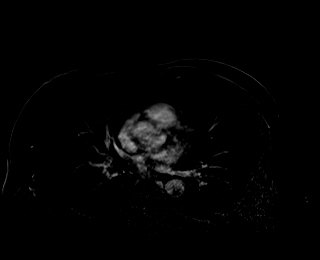

[Series 21: t1_vibe_fs_tra_p4_bh_post · axial · 3.0mm · 1.19mm/px · z∈[-98,+163]mm · 3 of 88 slices shown (4 of 4)]
[im 1/88]
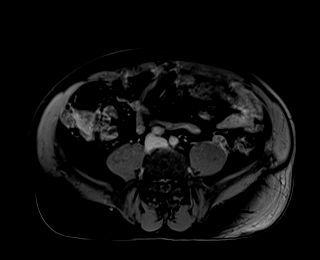
[im 44/88]
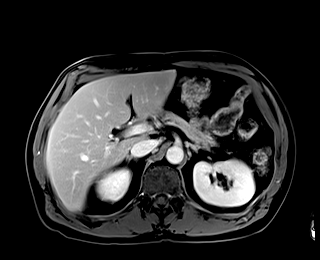
[im 88/88]
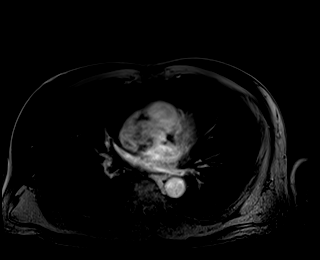

[Series 22: t1_vibe_fs_tra_p4_bh_post_sub · axial · 3.0mm · 1.19mm/px · z∈[-98,+163]mm · 3 of 88 slices shown (4 of 4)]
[im 1/88]
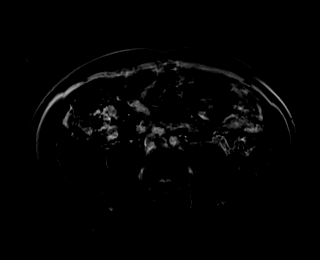
[im 44/88]
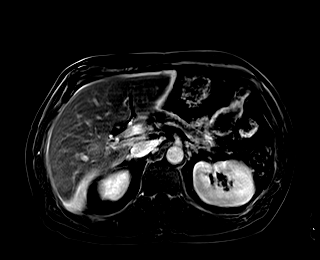
[im 88/88]
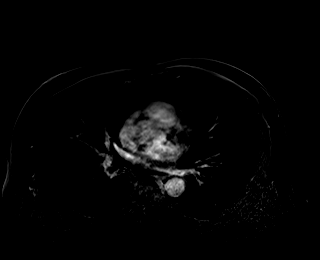

[Series 23: T1 dynamic post-contrast · coronal · 3.0mm · 1.31mm/px · 3 of 80 slices shown]
[im 1/80]
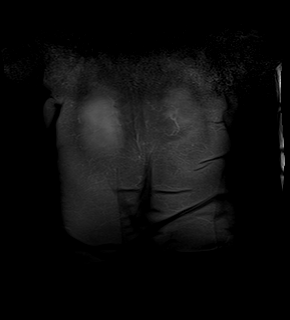
[im 40/80]
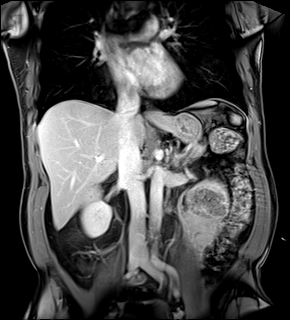
[im 80/80]
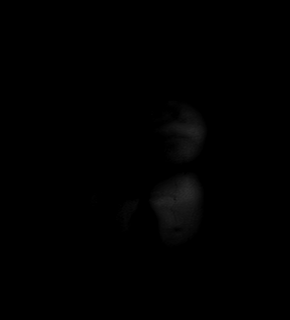

[48 of 48 positions shown; findings below may reference images not displayed]

FINDINGS: Lower chest: The lung bases are grossly clear. No pulmonary lesions
or pleural or pericardial effusion.

Hepatobiliary: Hepatic metastatic lesions are identified.

2.1 cm segment 7 lesion on image 29/15.

16 mm segment 6 lesion on image 47/15.

13 mm segment 6 lesion image 59/17.

Other smaller scattered lesions in both lobes.

The gallbladder is unremarkable. No intra or extrahepatic biliary
dilatation.

Pancreas:  No mass, inflammation or ductal dilatation.

Spleen:  Normal size. No focal lesions.

Adrenals/Urinary Tract: Small right adrenal gland nodule has MR
imaging features of a benign adenoma.

As demonstrated on the CT scan there is a large heterogeneously
enhancing mass associated with the lower pole region of left kidney.
This measures 5.8 x 5.5 x 4.9 cm. Associated perinephric hematoma as
demonstrated on the CT scan measuring approximately 6 x 5 cm. The
left renal vein is patent. No retroperitoneal lymphadenopathy is
identified.

Stomach/Bowel: The stomach, duodenum, visualized small bowel and
visualize colon are grossly normal.

Vascular/Lymphatic: The aorta and branch vessels are normal. The
major venous structures are patent.

No mesenteric or retroperitoneal lymphadenopathy.

Other:  No ascites or abdominal wall hernia.

Musculoskeletal: No significant bony findings. No findings
suspicious for osseous metastatic disease.
IMPRESSION: 1. 5.8 x 5.5 x 4.9 cm heterogeneously enhancing mass associated with
the lower pole region of the left kidney. This is consistent with
renal cell neoplasm. No evidence of renal vein involvement or
retroperitoneal lymphadenopathy.
2. Hepatic metastatic lesions.
3. Small right adrenal gland nodule consistent with a benign
adenoma.

## 2022-06-08 ENCOUNTER — Telehealth: Payer: Self-pay

## 2022-06-09 ENCOUNTER — Telehealth: Payer: Self-pay | Admitting: Internal Medicine

## 2022-06-09 NOTE — Telephone Encounter (Signed)
I am in receipt of notice from hospice that patient expired on 05/29/2022.  I called patient's wife today Ms. Chad Cantu and gave her my condolences.  She was appreciative for the call.

## 2022-06-21 ENCOUNTER — Encounter: Payer: Self-pay | Admitting: Oncology

## 2022-06-28 ENCOUNTER — Inpatient Hospital Stay: Payer: Medicaid Other | Admitting: Oncology

## 2022-06-28 ENCOUNTER — Inpatient Hospital Stay: Payer: Medicaid Other

## 2022-07-25 ENCOUNTER — Other Ambulatory Visit: Payer: Self-pay | Admitting: Internal Medicine

## 2022-07-27 ENCOUNTER — Other Ambulatory Visit: Payer: Self-pay | Admitting: Oncology
